# Patient Record
Sex: Female | Born: 1950 | Race: Black or African American | Hispanic: No | Marital: Single | Smoking: Never smoker
Health system: Southern US, Community
[De-identification: ages and names within clinical notes are randomized; demographics above are authoritative.]

## PROBLEM LIST (undated history)

## (undated) DIAGNOSIS — E785 Hyperlipidemia, unspecified: Secondary | ICD-10-CM

## (undated) DIAGNOSIS — C50919 Malignant neoplasm of unspecified site of unspecified female breast: Secondary | ICD-10-CM

## (undated) DIAGNOSIS — I639 Cerebral infarction, unspecified: Secondary | ICD-10-CM

## (undated) DIAGNOSIS — Z923 Personal history of irradiation: Secondary | ICD-10-CM

## (undated) DIAGNOSIS — T4145XA Adverse effect of unspecified anesthetic, initial encounter: Secondary | ICD-10-CM

## (undated) DIAGNOSIS — Z87442 Personal history of urinary calculi: Secondary | ICD-10-CM

## (undated) DIAGNOSIS — C801 Malignant (primary) neoplasm, unspecified: Secondary | ICD-10-CM

## (undated) DIAGNOSIS — M1990 Unspecified osteoarthritis, unspecified site: Secondary | ICD-10-CM

## (undated) DIAGNOSIS — R112 Nausea with vomiting, unspecified: Secondary | ICD-10-CM

## (undated) DIAGNOSIS — I10 Essential (primary) hypertension: Secondary | ICD-10-CM

## (undated) DIAGNOSIS — Z9889 Other specified postprocedural states: Secondary | ICD-10-CM

## (undated) DIAGNOSIS — T8859XA Other complications of anesthesia, initial encounter: Secondary | ICD-10-CM

## (undated) HISTORY — DX: Malignant neoplasm of unspecified site of unspecified female breast: C50.919

## (undated) HISTORY — DX: Hyperlipidemia, unspecified: E78.5

## (undated) HISTORY — DX: Essential (primary) hypertension: I10

## (undated) HISTORY — PX: ABDOMINAL HYSTERECTOMY: SHX81

---

## 2001-01-08 HISTORY — PX: EYE SURGERY: SHX253

## 2003-12-31 ENCOUNTER — Emergency Department: Payer: Self-pay | Admitting: Emergency Medicine

## 2004-01-13 ENCOUNTER — Ambulatory Visit: Payer: Self-pay | Admitting: Emergency Medicine

## 2005-03-15 ENCOUNTER — Ambulatory Visit: Payer: Self-pay

## 2006-01-29 ENCOUNTER — Ambulatory Visit: Payer: Self-pay | Admitting: Gastroenterology

## 2006-02-19 ENCOUNTER — Ambulatory Visit: Payer: Self-pay | Admitting: Internal Medicine

## 2006-03-27 ENCOUNTER — Ambulatory Visit: Payer: Self-pay | Admitting: Internal Medicine

## 2006-04-05 ENCOUNTER — Ambulatory Visit: Payer: Self-pay | Admitting: Internal Medicine

## 2006-04-16 ENCOUNTER — Ambulatory Visit: Payer: Self-pay | Admitting: General Practice

## 2006-05-09 ENCOUNTER — Ambulatory Visit: Payer: Self-pay | Admitting: General Practice

## 2006-10-11 ENCOUNTER — Ambulatory Visit: Payer: Self-pay | Admitting: Internal Medicine

## 2006-11-10 ENCOUNTER — Emergency Department: Payer: Self-pay | Admitting: Emergency Medicine

## 2007-01-09 DIAGNOSIS — I639 Cerebral infarction, unspecified: Secondary | ICD-10-CM

## 2007-01-09 HISTORY — DX: Cerebral infarction, unspecified: I63.9

## 2007-04-10 ENCOUNTER — Ambulatory Visit: Payer: Self-pay | Admitting: Internal Medicine

## 2007-08-11 ENCOUNTER — Emergency Department: Payer: Self-pay | Admitting: Emergency Medicine

## 2007-08-11 ENCOUNTER — Other Ambulatory Visit: Payer: Self-pay

## 2007-08-19 ENCOUNTER — Ambulatory Visit: Payer: Self-pay | Admitting: Internal Medicine

## 2007-08-26 ENCOUNTER — Ambulatory Visit: Payer: Self-pay | Admitting: Internal Medicine

## 2008-06-09 ENCOUNTER — Ambulatory Visit: Payer: Self-pay | Admitting: Internal Medicine

## 2009-03-31 ENCOUNTER — Ambulatory Visit: Payer: Self-pay | Admitting: Ophthalmology

## 2009-06-15 ENCOUNTER — Ambulatory Visit: Payer: Self-pay | Admitting: Internal Medicine

## 2009-09-27 ENCOUNTER — Ambulatory Visit: Payer: Self-pay | Admitting: Internal Medicine

## 2009-09-30 ENCOUNTER — Ambulatory Visit: Payer: Self-pay | Admitting: General Practice

## 2009-11-22 ENCOUNTER — Ambulatory Visit: Payer: Self-pay | Admitting: Urology

## 2010-04-04 ENCOUNTER — Ambulatory Visit: Payer: Self-pay | Admitting: Urology

## 2010-05-30 ENCOUNTER — Ambulatory Visit: Payer: Self-pay | Admitting: Internal Medicine

## 2010-10-31 ENCOUNTER — Ambulatory Visit: Payer: Self-pay | Admitting: Urology

## 2010-11-07 ENCOUNTER — Ambulatory Visit: Payer: Self-pay | Admitting: Urology

## 2011-01-09 DIAGNOSIS — C801 Malignant (primary) neoplasm, unspecified: Secondary | ICD-10-CM

## 2011-01-09 HISTORY — PX: PARTIAL NEPHRECTOMY: SHX414

## 2011-01-09 HISTORY — PX: KIDNEY SURGERY: SHX687

## 2011-01-09 HISTORY — DX: Malignant (primary) neoplasm, unspecified: C80.1

## 2011-02-13 ENCOUNTER — Ambulatory Visit: Payer: Self-pay | Admitting: General Practice

## 2011-07-17 ENCOUNTER — Ambulatory Visit: Payer: Self-pay | Admitting: Internal Medicine

## 2011-07-18 ENCOUNTER — Ambulatory Visit: Payer: Self-pay | Admitting: Urology

## 2011-07-18 LAB — CREATININE, SERUM: Creatinine: 1 mg/dL (ref 0.60–1.30)

## 2012-05-27 DIAGNOSIS — Z85528 Personal history of other malignant neoplasm of kidney: Secondary | ICD-10-CM | POA: Insufficient documentation

## 2012-08-01 ENCOUNTER — Ambulatory Visit: Payer: Self-pay | Admitting: Internal Medicine

## 2013-01-22 ENCOUNTER — Ambulatory Visit: Payer: Self-pay | Admitting: Physician Assistant

## 2013-02-08 ENCOUNTER — Ambulatory Visit: Payer: Self-pay | Admitting: Physician Assistant

## 2013-03-19 ENCOUNTER — Ambulatory Visit: Payer: Self-pay | Admitting: Obstetrics and Gynecology

## 2013-07-01 ENCOUNTER — Ambulatory Visit: Payer: Self-pay | Admitting: Physician Assistant

## 2013-07-08 ENCOUNTER — Ambulatory Visit: Payer: Self-pay | Admitting: Physician Assistant

## 2013-07-28 DIAGNOSIS — Z8673 Personal history of transient ischemic attack (TIA), and cerebral infarction without residual deficits: Secondary | ICD-10-CM | POA: Insufficient documentation

## 2013-08-19 ENCOUNTER — Ambulatory Visit: Payer: Self-pay | Admitting: Physician Assistant

## 2013-08-21 ENCOUNTER — Ambulatory Visit: Payer: Self-pay | Admitting: Internal Medicine

## 2013-09-08 ENCOUNTER — Ambulatory Visit: Payer: Self-pay | Admitting: Physician Assistant

## 2013-10-08 ENCOUNTER — Ambulatory Visit: Payer: Self-pay | Admitting: Physician Assistant

## 2013-11-18 ENCOUNTER — Ambulatory Visit: Payer: Self-pay | Admitting: Nurse Practitioner

## 2014-04-12 ENCOUNTER — Ambulatory Visit
Admit: 2014-04-12 | Disposition: A | Discharge status: Home / Self Care | Payer: Self-pay | Attending: Physician Assistant | Admitting: Physician Assistant

## 2014-05-18 ENCOUNTER — Encounter: Payer: 59 | Attending: Physician Assistant | Admitting: Dietician

## 2014-05-18 ENCOUNTER — Encounter: Payer: Self-pay | Admitting: Dietician

## 2014-05-18 VITALS — Ht 64.0 in | Wt 167.2 lb

## 2014-05-18 DIAGNOSIS — Z713 Dietary counseling and surveillance: Secondary | ICD-10-CM | POA: Insufficient documentation

## 2014-05-18 DIAGNOSIS — I10 Essential (primary) hypertension: Secondary | ICD-10-CM

## 2014-05-18 DIAGNOSIS — E669 Obesity, unspecified: Secondary | ICD-10-CM

## 2014-05-18 NOTE — Progress Notes (Signed)
Medical Nutrition Therapy: Visit time:1:15-1:45   Follow-up appointment  Diagnosis: hypertension Pt.'s priority is her desire to lose weight.  Current weight: 167.2  Height: 64 in.  Medications, supplements: see list  Progress and evaluation: Weight gain of 2.3 lbs in the past month since previous visit. Pt. stated that she has had increased stress with changes at work. She states she is discouraged that she has gained weight. She has been experiencing knee pain which has prevented her from walking for exercise. She has been eating larger portions at lunch and dinner and most of her rmeals are "out" with increased high fat choices. She also stated that snacks are being provided at he work which has been difficult to "turn down". She followed through with previous goal to switch from biscuit to [place] muffin but has not been able to follow through with other goals.  Physical activity: no structured exercise. She is "on her feet" at work and due to knee pain, does not walk for exercise at other times.   Nutrition Care Education: Weight control: benefits of weight control, identifying healthy weight, determining reasonable weight goal, behavioral changes for weight loss. Hypertension:  importance of controlling BP, identifying high sodium foods, Hyperlipidemia: lower fat choices when eating "out" Other lifestyle changes:  benefits of making changes, increasing motivation, readiness for change, identifying habits that need to change, alcohol use, food and drug interactions  Nutritional Diagnosis:  Inadequate intake of fruits/vegetables, frequent intake of high fat foods, frequent intake of sweetened beverages  Intervention: Pt. set goals to switch to sugar-free beverages or water. To use better portion control at lunch and dinner. To try to include a fruit or vegetable or both at meals.   Education Materials given:  Marland Kitchen Goals/ instructions  Learner/ who was taught:  . Patient   Level of  understanding: Marland Kitchen Verbalizes/ demonstrates competency Learning barriers: . None  Willingness to learn/ readiness for change: . Acceptance, ready for change  Monitoring and Evaluation:  Follow-up 07/28/14 at 8:00am

## 2014-05-18 NOTE — Patient Instructions (Signed)
Pt. Set goals to switch to sugar-free beverages or water. To use better portion control at lunch and dinner. To try to include a fruit or vegetable or both at meals.

## 2014-07-28 ENCOUNTER — Encounter: Payer: Self-pay | Admitting: Dietician

## 2014-07-28 ENCOUNTER — Encounter: Payer: 59 | Attending: Physician Assistant | Admitting: Dietician

## 2014-07-28 VITALS — Ht 64.0 in | Wt 165.6 lb

## 2014-07-28 DIAGNOSIS — Z713 Dietary counseling and surveillance: Secondary | ICD-10-CM | POA: Diagnosis not present

## 2014-07-28 DIAGNOSIS — I10 Essential (primary) hypertension: Secondary | ICD-10-CM

## 2014-07-28 NOTE — Progress Notes (Signed)
Medical Nutrition Therapy Follow-up visit:  Time:8:00am to 8:45am  ASSESSMENT:  Diagnosis: hypertension, obesity (Pt.'s main focus is desire to lose weight and maintain that weight loss).  Current weight:165.6    Height: 64" Medications: See list  Progress and evaluation:  Pt.lost 1.6 lbs since previous visit approximately 2 months ago. She expressed that she was very pleased that she had not gained weight. Regarding her previous goals, she continues to include some sweetened beverages but has decreased amount and is drinking 1 (20 oz) bottle of water per day. She states she has been able to control portions better but still needs to continue to work on this. She does not eat fruit on a daily basis and includes 1-2 servings of vegetables per day.  Physical activity: She had started walking for exercise but do to heat has not been able to do so in past 2 weeks.  NUTRITION CARE EDUCATION:  Weight control: Commended patient on her perseverance with her efforts to improve eating habits. Reviewed previous goals and her progress and barriers.  Hypertension:  Discussed how increasing fruit and vegetable intake not only helps with weight loss but also adds potassium to her diet which has a positive effect on blood pressure. Reviewed ways to decrease sodium in the diet. INTERVENTION:  Increase water to 2 (20 oz) bottles per day. To continue to decrease sugar sweetened beverages. Increase fruit to 1 serving per day. Continue to include as many vegetables as possible. Consider steamed vegetables or slicing a full tomato and cucumber with evening meal.  EDUCATION MATERIALS GIVEN:  . Goals/ instructions  LEARNER/ who was taught:  . Patient  LEVEL OF UNDERSTANDING: . Verbalizes/ demonstrates competency LEARNING BARRIERS: . None WILLINGNESS TO LEARN/READINESS FOR CHANGE: . Eager, change in progress MONITORING AND EVALUATION:  Diet, exercise, weight. Pt. requested follow-up in 6 months.01/19/15  at 8:00am

## 2014-07-28 NOTE — Progress Notes (Deleted)
  Medical Nutrition Therapy:  Appt start time: {Time; Appointment:21385} end time:  {Time; Appointment:21385}.   Assessment:  Primary concerns today: ***.   Preferred Learning Style: ***  Auditory  Visual  Hands on  No preference indicated   Learning Readiness: ***  Not ready  Contemplating  Ready  Change in progress   MEDICATIONS: ***   DIETARY INTAKE:  Usual eating pattern includes *** meals and *** snacks per day.  Everyday foods include ***.  Avoided foods include ***.    24-hr recall:  B ( AM): ***  Snk ( AM): ***  L ( PM): *** Snk ( PM): *** D ( PM): *** Snk ( PM): *** Beverages: ***  Usual physical activity: ***  Estimated energy needs: *** calories *** g carbohydrates *** g protein *** g fat  Progress Towards Goal(s):  {Desc; Goals Progress:21388}.   Nutritional Diagnosis:  {CHL AMB NUTRITIONAL DIAGNOSIS:[phone]}    Intervention:  Nutrition ***.  Teaching Method Utilized: *** Visual Auditory Hands on  Handouts given during visit include:  ***  ***  Barriers to learning/adherence to lifestyle change: ***  Demonstrated degree of understanding via:  Teach Back   Monitoring/Evaluation:  Dietary intake, exercise, ***, and body weight {follow up:15908}.

## 2014-07-28 NOTE — Patient Instructions (Signed)
Increase water to 2 (20 oz) bottles per day. To continue to decrease sugar sweetened beverages. Increase fruit to 1 serving per day. Continue to include as many vegetables as possible. Consider steamed vegetables or slicing a full tomato and cucumber with evening meal.

## 2014-08-03 ENCOUNTER — Other Ambulatory Visit: Payer: Self-pay | Admitting: Internal Medicine

## 2014-08-03 DIAGNOSIS — Z1231 Encounter for screening mammogram for malignant neoplasm of breast: Secondary | ICD-10-CM

## 2014-08-23 ENCOUNTER — Ambulatory Visit: Payer: 59

## 2014-08-27 ENCOUNTER — Other Ambulatory Visit: Payer: Self-pay | Admitting: Internal Medicine

## 2014-08-27 ENCOUNTER — Ambulatory Visit
Admission: RE | Admit: 2014-08-27 | Discharge: 2014-08-27 | Disposition: A | Discharge status: Home / Self Care | Payer: 59 | Source: Ambulatory Visit | Attending: Internal Medicine | Admitting: Internal Medicine

## 2014-08-27 DIAGNOSIS — Z1231 Encounter for screening mammogram for malignant neoplasm of breast: Secondary | ICD-10-CM | POA: Diagnosis not present

## 2014-08-27 DIAGNOSIS — R21 Rash and other nonspecific skin eruption: Secondary | ICD-10-CM | POA: Insufficient documentation

## 2014-08-27 HISTORY — DX: Malignant (primary) neoplasm, unspecified: C80.1

## 2014-08-31 ENCOUNTER — Other Ambulatory Visit: Payer: Self-pay | Admitting: Internal Medicine

## 2014-08-31 DIAGNOSIS — R921 Mammographic calcification found on diagnostic imaging of breast: Secondary | ICD-10-CM

## 2014-08-31 DIAGNOSIS — R928 Other abnormal and inconclusive findings on diagnostic imaging of breast: Secondary | ICD-10-CM

## 2014-09-03 ENCOUNTER — Ambulatory Visit
Admission: RE | Admit: 2014-09-03 | Discharge: 2014-09-03 | Disposition: A | Discharge status: Home / Self Care | Payer: 59 | Source: Ambulatory Visit | Attending: Internal Medicine | Admitting: Internal Medicine

## 2014-09-03 DIAGNOSIS — R928 Other abnormal and inconclusive findings on diagnostic imaging of breast: Secondary | ICD-10-CM

## 2014-09-03 DIAGNOSIS — R921 Mammographic calcification found on diagnostic imaging of breast: Secondary | ICD-10-CM | POA: Insufficient documentation

## 2014-10-26 ENCOUNTER — Encounter: Payer: Self-pay | Admitting: Physician Assistant

## 2014-10-26 ENCOUNTER — Ambulatory Visit: Payer: Self-pay | Admitting: Physician Assistant

## 2014-10-26 VITALS — BP 148/90 | Temp 98.3°F | Wt 164.0 lb

## 2014-10-26 DIAGNOSIS — H6500 Acute serous otitis media, unspecified ear: Secondary | ICD-10-CM

## 2014-10-26 MED ORDER — FLUTICASONE PROPIONATE 50 MCG/ACT NA SUSP: 2.0000 | Freq: Every day | NASAL | Status: DC

## 2014-10-26 MED ORDER — PREDNISONE 10 MG PO TABS
30.0000 mg | ORAL_TABLET | Freq: Every day | ORAL | Status: DC
Start: 2014-10-26 — End: 2014-12-14

## 2014-10-26 MED ORDER — FLUCONAZOLE 150 MG PO TABS: 150.0000 mg | ORAL_TABLET | Freq: Once | ORAL | Status: DC

## 2014-10-26 MED ORDER — AMOXICILLIN 875 MG PO TABS: 875.0000 mg | ORAL_TABLET | Freq: Two times a day (BID) | ORAL | Status: DC

## 2014-10-26 NOTE — Progress Notes (Signed)
S:  C/o ears popping and being stopped up, no drainage from ears, no fever/chills, no cough or congestion, some sinus pressure, also left hand and forearm hurting from carpal tunnel, burning type pain, makes the back of her left shoulder hurt, no recent injury, no cp/sob; remainder ros neg Using otc meds without relief  O:  Vitals wnl, nad, tms dull b/l, left tm  Has serous fluid line;  nasal mucosa swollen, throat wnl, neck supple no lymph, lungs c t a, cv rrr, neuro intact, cspine nontender, levator scapula tense, tender, grips = b/l  A: acute eustachean tube dysfunction, acute otitis media, chronic pain secondary to carpal tunnel  P: flonase, amoxil 875mg  bid x 10d, diflucan 150mg  1 now and 1 in 1 week, prednisone 30mg  qd x 3d, return if not improving in 3 to 5 days, return earlier if worsening

## 2014-11-17 ENCOUNTER — Other Ambulatory Visit: Payer: Self-pay | Admitting: Nurse Practitioner

## 2014-11-17 DIAGNOSIS — C649 Malignant neoplasm of unspecified kidney, except renal pelvis: Secondary | ICD-10-CM

## 2014-11-30 ENCOUNTER — Ambulatory Visit
Admission: RE | Admit: 2014-11-30 | Discharge: 2014-11-30 | Disposition: A | Discharge status: Home / Self Care | Payer: 59 | Source: Ambulatory Visit | Attending: Nurse Practitioner | Admitting: Nurse Practitioner

## 2014-11-30 DIAGNOSIS — Z905 Acquired absence of kidney: Secondary | ICD-10-CM | POA: Diagnosis not present

## 2014-11-30 DIAGNOSIS — C642 Malignant neoplasm of left kidney, except renal pelvis: Secondary | ICD-10-CM | POA: Diagnosis not present

## 2014-11-30 DIAGNOSIS — C649 Malignant neoplasm of unspecified kidney, except renal pelvis: Secondary | ICD-10-CM

## 2014-11-30 MED ORDER — IOHEXOL 350 MG/ML SOLN
100.0000 mL | Freq: Once | INTRAVENOUS | Status: AC | PRN
  Administered 2014-11-30: 100 mL via INTRAVENOUS

## 2014-12-14 ENCOUNTER — Encounter: Payer: Self-pay | Admitting: Physician Assistant

## 2014-12-14 ENCOUNTER — Ambulatory Visit: Payer: Self-pay | Admitting: Physician Assistant

## 2014-12-14 VITALS — BP 140/90 | HR 76 | Temp 98.5°F

## 2014-12-14 DIAGNOSIS — J3089 Other allergic rhinitis: Secondary | ICD-10-CM

## 2014-12-14 NOTE — Progress Notes (Signed)
S: just wants recheck of ears, sinuses, granddaughter is sick in hospital, states she just has clear mucus when her nose runs or when she coughs, no fever/chills, no cp/sob  O: vitals wnl, nad, tms dull, nasal mucosa inflamed, throat wnl, neck supple no lymph, lungs c t a, cv rrr  A: common cold, allergic rhinitis  P: reassurance, flonase, f/u if worsening, wear mask around granddaughter

## 2015-01-11 ENCOUNTER — Encounter: Payer: Self-pay | Admitting: Physician Assistant

## 2015-01-11 ENCOUNTER — Ambulatory Visit: Payer: Self-pay | Admitting: Physician Assistant

## 2015-01-11 VITALS — BP 124/80 | Temp 98.5°F

## 2015-01-11 DIAGNOSIS — N390 Urinary tract infection, site not specified: Secondary | ICD-10-CM

## 2015-01-11 DIAGNOSIS — R319 Hematuria, unspecified: Principal | ICD-10-CM

## 2015-01-11 LAB — POCT URINALYSIS DIPSTICK
Bilirubin, UA: NEGATIVE
Glucose, UA: NEGATIVE
KETONES UA: NEGATIVE
Nitrite, UA: NEGATIVE
PH UA: 5.5
Spec Grav, UA: 1.015
Urobilinogen, UA: 0.2

## 2015-01-11 MED ORDER — CIPROFLOXACIN HCL 250 MG PO TABS: 250.0000 mg | ORAL_TABLET | Freq: Two times a day (BID) | ORAL | Status: DC

## 2015-01-11 NOTE — Addendum Note (Signed)
Addended by: Vassie Loll D on: 01/11/2015 09:36 AM   Modules accepted: Orders

## 2015-01-11 NOTE — Progress Notes (Signed)
S:  C/o uti sx for 2 days, burning, urgency, frequency, denies vaginal discharge, abdominal pain or flank pain:  Remainder ros neg  O:  Vitals wnl, nad, no cva tenderness, back nontender, lungs c t a,cv rrr, abd soft nontender, bs normal, n/v intact  A: uti  P: cipro 250mg  bid x 7d, increase water intake, add cranberry juice, return if not improving in 2 -3 days, return earlier if worsening, discussed pyelonephritis sx

## 2015-01-19 ENCOUNTER — Encounter: Payer: 59 | Attending: Physician Assistant | Admitting: Dietician

## 2015-01-19 ENCOUNTER — Encounter: Payer: Self-pay | Admitting: Dietician

## 2015-01-19 VITALS — Ht 64.0 in | Wt 164.3 lb

## 2015-01-19 DIAGNOSIS — E669 Obesity, unspecified: Secondary | ICD-10-CM | POA: Diagnosis not present

## 2015-01-19 DIAGNOSIS — I10 Essential (primary) hypertension: Secondary | ICD-10-CM | POA: Diagnosis not present

## 2015-01-19 NOTE — Patient Instructions (Signed)
Increase water from 2 (16 oz) to 3 (16oz) bottles per day. At lunch, balance a higher fat choice such as chicken pie with lower fat choices such as non-starchy vegetables and fruit. Ok to have a snack type dinner, but try to balance. (cheese/crackers, fruit) or if picks up chicken nuggets, add steamed vegetables and fruit at home. Limit "sweet" snack to once daily.

## 2015-01-19 NOTE — Progress Notes (Signed)
Medical Nutrition Therapy Follow-up visit:  Time with patient: 8:15-8:45 ASSESSMENT:  Diagnosis:hypertension, obesity (Patient's main focus is weight management)  Current weight:164.3 lbs    Height:64 in Medications: See list Progress and evaluation:Patient's weight is 1.3 lbs less than last visit, 5 1/2 months ago. She continues to limit sodas to 1 (16oz) per day and has increased her water intake to 2 (16oz) bottles per day. She eats most lunch meals in hospital cafeteria and is making more high fat choices "than I should" she states. Ex. Yesterday's lunch was chicken pie with macaroni and cheese, dessert and Mt. Dew. She cooks on Sunday and has a meat and 2 vegetables but usually eats a "snack" for evening meal on week nights; ex. Last night she ate chocolate coated pretzels. She picks up chicken nuggets 1x per week for dinner with fries or a shake. Her beverages are juice (2x/day), 32 oz water, and 16 oz soda.  Physical activity: no structured exercise. Patient is "on her feet" all day at work.   NUTRITION CARE EDUCATION: Weight control: Commended patient on her stable weight even after the holiday season. Discussed food group servings needed to meet basic nutrient needs. Discussed how snacks can help with meeting groups that are lacking such as fruit and  milk (yogurt). Discussed options for dinner meal that would not involve a lot of food preparation/time. Reviewed food guide plate and how this tool can help balance meal when planning meals. Hypertension:  Discussed how adding more fruits and vegetables can help decrease portions of higher sodium foods and can provide more potassium in the diet and less sodium. Other lifestyle changes:  Discussed chair exercises that patient could do while watching TV in the evening since she finds structured exercise difficult after working "on feet" all day. INTERVENTION:  Increase water from 2 (16 oz) to 3 (16oz) bottles per day. At lunch, balance a  higher fat choice such as chicken pie with lower fat choices such as non-starchy vegetables and fruit. Ok to have a snack type dinner, but try to balance. (cheese/crackers, fruit) or if picks up chicken nuggets, add steamed vegetables and fruit at home. Limit "sweet" snack to once daily.   EDUCATION MATERIALS GIVEN:  . Goals/ instructions  LEARNER/ who was taught:  . Patient   LEVEL OF UNDERSTANDING: . Partial understanding; needs review/ practice LEARNING BARRIERS: . None WILLINGNESS TO LEARN/READINESS FOR CHANGE: . Acceptance, ready for change  MONITORING AND EVALUATION:  Weight, exercise, Follow-up- 06/22/15 at 8:15am

## 2015-01-25 DIAGNOSIS — C642 Malignant neoplasm of left kidney, except renal pelvis: Secondary | ICD-10-CM | POA: Diagnosis not present

## 2015-01-25 DIAGNOSIS — I10 Essential (primary) hypertension: Secondary | ICD-10-CM | POA: Diagnosis not present

## 2015-02-01 DIAGNOSIS — G5603 Carpal tunnel syndrome, bilateral upper limbs: Secondary | ICD-10-CM | POA: Diagnosis not present

## 2015-02-01 DIAGNOSIS — E784 Other hyperlipidemia: Secondary | ICD-10-CM | POA: Diagnosis not present

## 2015-02-01 DIAGNOSIS — I10 Essential (primary) hypertension: Secondary | ICD-10-CM | POA: Diagnosis not present

## 2015-02-16 ENCOUNTER — Ambulatory Visit: Payer: Self-pay | Admitting: Physician Assistant

## 2015-02-16 ENCOUNTER — Encounter: Payer: Self-pay | Admitting: Physician Assistant

## 2015-02-16 VITALS — BP 130/80 | Temp 97.9°F

## 2015-02-16 DIAGNOSIS — N390 Urinary tract infection, site not specified: Secondary | ICD-10-CM

## 2015-02-16 DIAGNOSIS — R319 Hematuria, unspecified: Principal | ICD-10-CM

## 2015-02-16 MED ORDER — NITROFURANTOIN MONOHYD MACRO 100 MG PO CAPS: 100.0000 mg | ORAL_CAPSULE | Freq: Two times a day (BID) | ORAL | Status: DC

## 2015-02-16 NOTE — Progress Notes (Signed)
S: c/o body aches, just doesn't feel well, some burning with urination, no fever, no abdominal pain  O: vitals wnl, lungs c t a, cv rrr  A: uti  P: macrobid 100mg  bid x 7d, recheck on day after finish pills

## 2015-02-17 LAB — POCT URINALYSIS DIPSTICK
BILIRUBIN UA: NEGATIVE
GLUCOSE UA: NEGATIVE
KETONES UA: NEGATIVE
Nitrite, UA: NEGATIVE
Spec Grav, UA: 1.02
Urobilinogen, UA: 0.2
pH, UA: 7

## 2015-02-17 NOTE — Addendum Note (Signed)
Addended by: Vassie Loll D on: 02/17/2015 09:01 AM   Modules accepted: Orders

## 2015-03-07 ENCOUNTER — Ambulatory Visit: Payer: Self-pay | Admitting: Family

## 2015-03-07 ENCOUNTER — Encounter: Payer: Self-pay | Admitting: Physician Assistant

## 2015-03-07 ENCOUNTER — Other Ambulatory Visit
Admission: RE | Admit: 2015-03-07 | Discharge: 2015-03-07 | Disposition: A | Discharge status: Home / Self Care | Payer: 59 | Source: Ambulatory Visit | Attending: Medical | Admitting: Medical

## 2015-03-07 VITALS — BP 120/70 | HR 76 | Temp 97.6°F

## 2015-03-07 DIAGNOSIS — N390 Urinary tract infection, site not specified: Secondary | ICD-10-CM

## 2015-03-07 DIAGNOSIS — H6982 Other specified disorders of Eustachian tube, left ear: Secondary | ICD-10-CM

## 2015-03-07 DIAGNOSIS — R300 Dysuria: Secondary | ICD-10-CM

## 2015-03-07 LAB — POCT URINALYSIS DIPSTICK
BILIRUBIN UA: NEGATIVE
GLUCOSE UA: NEGATIVE
Ketones, UA: NEGATIVE
NITRITE UA: NEGATIVE
Protein, UA: NEGATIVE
Spec Grav, UA: >=1.03
Urobilinogen, UA: 0.2
pH, UA: 5.5

## 2015-03-08 NOTE — Progress Notes (Signed)
See note in paper chart by Heather Ratcliffe, PAC  

## 2015-03-09 LAB — URINE CULTURE

## 2015-03-10 ENCOUNTER — Telehealth: Payer: Self-pay | Admitting: Family

## 2015-03-10 NOTE — Telephone Encounter (Signed)
Urine Culture + enterobacter sensitive to cipro   Tell her to continue cipro and RTC  2-3 days after last dose for repeat U/A .  [place] FNP

## 2015-03-11 NOTE — Telephone Encounter (Signed)
Spoke with patient informed her Per Tommie that she is to continue Cipro til gone then repeat UA.

## 2015-03-22 ENCOUNTER — Ambulatory Visit: Payer: Self-pay | Admitting: Physician Assistant

## 2015-03-22 DIAGNOSIS — R300 Dysuria: Secondary | ICD-10-CM

## 2015-03-22 LAB — POCT URINALYSIS DIPSTICK
BILIRUBIN UA: NEGATIVE
Glucose, UA: NEGATIVE
Ketones, UA: NEGATIVE
LEUKOCYTES UA: NEGATIVE
NITRITE UA: NEGATIVE
PH UA: 5.5
PROTEIN UA: NEGATIVE
RBC UA: NEGATIVE
Spec Grav, UA: 1.025
UROBILINOGEN UA: 0.2

## 2015-03-22 NOTE — Progress Notes (Signed)
Pt came by to recheck urine, ua wnl, uti is resolved; seen by cma

## 2015-04-26 DIAGNOSIS — H401112 Primary open-angle glaucoma, right eye, moderate stage: Secondary | ICD-10-CM | POA: Diagnosis not present

## 2015-05-03 DIAGNOSIS — H401111 Primary open-angle glaucoma, right eye, mild stage: Secondary | ICD-10-CM | POA: Diagnosis not present

## 2015-05-20 ENCOUNTER — Other Ambulatory Visit: Payer: Self-pay | Admitting: Unknown Physician Specialty

## 2015-05-20 ENCOUNTER — Ambulatory Visit
Admission: RE | Admit: 2015-05-20 | Discharge: 2015-05-20 | Disposition: A | Discharge status: Home / Self Care | Payer: 59 | Source: Ambulatory Visit | Attending: Unknown Physician Specialty | Admitting: Unknown Physician Specialty

## 2015-05-20 DIAGNOSIS — R1032 Left lower quadrant pain: Secondary | ICD-10-CM | POA: Insufficient documentation

## 2015-05-20 DIAGNOSIS — Z905 Acquired absence of kidney: Secondary | ICD-10-CM | POA: Diagnosis not present

## 2015-05-20 DIAGNOSIS — N200 Calculus of kidney: Secondary | ICD-10-CM | POA: Diagnosis not present

## 2015-05-20 DIAGNOSIS — K5790 Diverticulosis of intestine, part unspecified, without perforation or abscess without bleeding: Secondary | ICD-10-CM | POA: Diagnosis not present

## 2015-05-27 ENCOUNTER — Ambulatory Visit: Payer: 59

## 2015-06-22 ENCOUNTER — Encounter: Payer: Self-pay | Admitting: Dietician

## 2015-06-22 ENCOUNTER — Encounter: Payer: 59 | Attending: Physician Assistant | Admitting: Dietician

## 2015-06-22 VITALS — Ht 64.0 in | Wt 165.0 lb

## 2015-06-22 DIAGNOSIS — E669 Obesity, unspecified: Secondary | ICD-10-CM | POA: Insufficient documentation

## 2015-06-22 DIAGNOSIS — I10 Essential (primary) hypertension: Secondary | ICD-10-CM | POA: Diagnosis not present

## 2015-06-22 NOTE — Patient Instructions (Signed)
Increase water intake to 2 (16 oz) bottles per day. Include yogurt for some breakfast meals. Add a fruit or vegetable or both to as many meals as possible.

## 2015-06-22 NOTE — Progress Notes (Signed)
Medical Nutrition Therapy: Visit start time: 8:05 end time: 8:45 Assessment:  Diagnosis: obesity, hypertension  Current weight: 165 lbs  Height: 64 in Medications, supplements: see list Progress and evaluation:  Patient in for medical nutrition therapy follow-up appointment. She has maintained a stable weight in past 5 months since previous visit. She reports she is eating breakfast on most mornings giving examples of [place] crackers or a breakfast bar or [place] Clap McGriddle breakfast or eggs, small pancake and bacon 1-2 mornings per week. She eats lunch in hospital cafeteria and reports she often includes a dessert or chocolate covered pretzels.She doesn't cook often during the week so eats a snack type supper or skips and eats chips. Her beverages are 16 oz.soda, 8-16 oz. water and 16 oz. orange juice. She reports she had diverticulitis in May '17. Her present diet is low in fruits, vegetables, whole grains and fiber. Patient expresses a continued desire to lose weight.  Physical activity: Pt. Is "on her feet" most of her work day and sometimes works a double shift. She has plans to join [employer].   Nutrition Care Education:  Weight control: Commended patient on maintaining a stable weight over the past year despite increased work related stress. Discussed re-establishing a goal of losing 5-10% of body weight. Discussed ways to decrease current intake by 300-500 calories such as eliminating regular soda or choosing yogurt (she states she loves yogurt) in place of dessert on most days. Also discussed yogurt in terms of benefiting as a probiotic. Discussed need to increase fiber to help decrease risk of diverticulitis acknowledging need for low fiber during an attack. Discussed practical ways to increase fruits/vegetables. Commended on her plans to join [facility] gym and how exercise will not only help with weight loss and blood pressure but can also help with stress management. Hypertension:  Discussed  how adding fruits and vegetables can add natural potassium to the diet which can play a role in lowering blood pressure. Add these foods to help decrease intake of high sodium foods as we have discussed in previous visits.  Intervention: Goals that patient made: Increase water intake to 2 (16 oz) bottles per day. Include yogurt for some breakfast meals. Add a fruit or vegetable or both to as many meals as possible. Join hospital gym. Education Materials given:  Marland Kitchen Goals/ instructions Learner/ who was taught:  . Patient  Level of understanding: Marland Kitchen Verbalizes/ demonstrates competency Learning barriers: . None Willingness to learn/ readiness for change: . Eager, change in progress  Monitoring and Evaluation:  No follow-up scheduled. Patient was encouraged to call if desires further help regarding her diet/nutrition.

## 2015-08-02 DIAGNOSIS — C642 Malignant neoplasm of left kidney, except renal pelvis: Secondary | ICD-10-CM | POA: Diagnosis not present

## 2015-08-02 DIAGNOSIS — E784 Other hyperlipidemia: Secondary | ICD-10-CM | POA: Diagnosis not present

## 2015-08-02 DIAGNOSIS — I10 Essential (primary) hypertension: Secondary | ICD-10-CM | POA: Diagnosis not present

## 2015-08-02 DIAGNOSIS — Z8673 Personal history of transient ischemic attack (TIA), and cerebral infarction without residual deficits: Secondary | ICD-10-CM | POA: Diagnosis not present

## 2015-08-09 ENCOUNTER — Other Ambulatory Visit: Payer: Self-pay | Admitting: Internal Medicine

## 2015-08-09 DIAGNOSIS — Z8673 Personal history of transient ischemic attack (TIA), and cerebral infarction without residual deficits: Secondary | ICD-10-CM | POA: Diagnosis not present

## 2015-08-09 DIAGNOSIS — N760 Acute vaginitis: Secondary | ICD-10-CM | POA: Diagnosis not present

## 2015-08-09 DIAGNOSIS — I10 Essential (primary) hypertension: Secondary | ICD-10-CM | POA: Diagnosis not present

## 2015-08-09 DIAGNOSIS — Z85528 Personal history of other malignant neoplasm of kidney: Secondary | ICD-10-CM | POA: Diagnosis not present

## 2015-08-09 DIAGNOSIS — E784 Other hyperlipidemia: Secondary | ICD-10-CM | POA: Diagnosis not present

## 2015-08-10 ENCOUNTER — Other Ambulatory Visit: Payer: Self-pay | Admitting: Internal Medicine

## 2015-08-10 DIAGNOSIS — R920 Mammographic microcalcification found on diagnostic imaging of breast: Secondary | ICD-10-CM

## 2015-09-27 ENCOUNTER — Ambulatory Visit
Admission: RE | Admit: 2015-09-27 | Discharge: 2015-09-27 | Disposition: A | Discharge status: Home / Self Care | Payer: 59 | Source: Ambulatory Visit | Attending: Internal Medicine | Admitting: Internal Medicine

## 2015-09-27 DIAGNOSIS — R920 Mammographic microcalcification found on diagnostic imaging of breast: Secondary | ICD-10-CM

## 2015-09-27 DIAGNOSIS — R921 Mammographic calcification found on diagnostic imaging of breast: Secondary | ICD-10-CM | POA: Diagnosis not present

## 2015-09-30 ENCOUNTER — Other Ambulatory Visit: Payer: Self-pay | Admitting: Nurse Practitioner

## 2015-09-30 DIAGNOSIS — C649 Malignant neoplasm of unspecified kidney, except renal pelvis: Secondary | ICD-10-CM

## 2015-10-11 ENCOUNTER — Ambulatory Visit
Admission: RE | Admit: 2015-10-11 | Discharge: 2015-10-11 | Disposition: A | Discharge status: Home / Self Care | Payer: 59 | Source: Ambulatory Visit | Attending: Nurse Practitioner | Admitting: Nurse Practitioner

## 2015-10-11 DIAGNOSIS — C649 Malignant neoplasm of unspecified kidney, except renal pelvis: Secondary | ICD-10-CM | POA: Diagnosis not present

## 2015-10-11 LAB — POCT I-STAT CREATININE: CREATININE: 0.9 mg/dL (ref 0.44–1.00)

## 2015-10-11 MED ORDER — IOPAMIDOL (ISOVUE-300) INJECTION 61%
100.0000 mL | Freq: Once | INTRAVENOUS | Status: AC | PRN
  Administered 2015-10-11: 100 mL via INTRAVENOUS

## 2015-10-21 ENCOUNTER — Encounter: Payer: Self-pay | Admitting: Physician Assistant

## 2015-10-21 ENCOUNTER — Ambulatory Visit: Payer: Self-pay | Admitting: Physician Assistant

## 2015-10-21 VITALS — BP 154/80 | HR 76 | Temp 98.5°F

## 2015-10-21 DIAGNOSIS — M25562 Pain in left knee: Secondary | ICD-10-CM

## 2015-10-21 DIAGNOSIS — J301 Allergic rhinitis due to pollen: Secondary | ICD-10-CM

## 2015-10-21 NOTE — Progress Notes (Signed)
S: c/o runny nose, congestion, watery eyes, some sinus pressure, sx for about a week, also l knee pain and leg pain, been doing the steps a lot more, pain when she bears weight, no swelling, wears support hose, takes asa qd, nonsmoker and no hormone replacement;  denies fever/chills/body aches, cough, cp/sob, or v/d  O: vitals wnl, nad, perrl eomi, conjunctiva wnl, tms dull, nasal mucosa swollen and boggy, throat wnl, neck supple no lymph, lungs c t a, cv rrr, left knee no bony tenderness, full rom, no swelling or tenderness in calf, slight tenderness in posterior of knee, no swelling or cord noted, neg homan's sign, n/v intact  A: acute seasonal allergies, knee pain  P: saline nasal rinse, otc allergy meds, continue asa qd, if leg pain worsens then call clinic and will order [place]

## 2015-10-27 DIAGNOSIS — M25562 Pain in left knee: Secondary | ICD-10-CM | POA: Diagnosis not present

## 2015-10-27 DIAGNOSIS — M25561 Pain in right knee: Secondary | ICD-10-CM | POA: Diagnosis not present

## 2015-11-01 DIAGNOSIS — H4010X4 Unspecified open-angle glaucoma, indeterminate stage: Secondary | ICD-10-CM | POA: Diagnosis not present

## 2015-11-02 ENCOUNTER — Other Ambulatory Visit: Payer: Self-pay | Admitting: Physician Assistant

## 2015-11-14 DIAGNOSIS — Z905 Acquired absence of kidney: Secondary | ICD-10-CM | POA: Diagnosis not present

## 2015-11-14 DIAGNOSIS — C649 Malignant neoplasm of unspecified kidney, except renal pelvis: Secondary | ICD-10-CM | POA: Diagnosis not present

## 2015-11-14 DIAGNOSIS — Z882 Allergy status to sulfonamides status: Secondary | ICD-10-CM | POA: Diagnosis not present

## 2015-11-14 DIAGNOSIS — Z888 Allergy status to other drugs, medicaments and biological substances status: Secondary | ICD-10-CM | POA: Diagnosis not present

## 2015-11-28 ENCOUNTER — Ambulatory Visit: Payer: Self-pay | Admitting: Physician Assistant

## 2015-11-28 DIAGNOSIS — M25562 Pain in left knee: Secondary | ICD-10-CM

## 2015-11-28 NOTE — Progress Notes (Signed)
S: was at work, climbing steps with bottles of water, the water went one way and her knee went the other, felt a really loud pop, now area is more swollen than normal, no other injury  O: vitals wnl, nad, left knee swollen posteriorly, no redness or increased warmth, area tender to palp, n/v intact  A: knee pain  P: bc injury happened at work referred over to workers comp

## 2015-12-27 DIAGNOSIS — L821 Other seborrheic keratosis: Secondary | ICD-10-CM | POA: Diagnosis not present

## 2015-12-27 DIAGNOSIS — L814 Other melanin hyperpigmentation: Secondary | ICD-10-CM | POA: Diagnosis not present

## 2015-12-27 DIAGNOSIS — L819 Disorder of pigmentation, unspecified: Secondary | ICD-10-CM | POA: Diagnosis not present

## 2015-12-27 DIAGNOSIS — L818 Other specified disorders of pigmentation: Secondary | ICD-10-CM | POA: Diagnosis not present

## 2016-01-09 HISTORY — PX: COLONOSCOPY: SHX174

## 2016-02-02 DIAGNOSIS — E784 Other hyperlipidemia: Secondary | ICD-10-CM | POA: Diagnosis not present

## 2016-02-02 DIAGNOSIS — Z85528 Personal history of other malignant neoplasm of kidney: Secondary | ICD-10-CM | POA: Diagnosis not present

## 2016-02-07 DIAGNOSIS — E784 Other hyperlipidemia: Secondary | ICD-10-CM | POA: Diagnosis not present

## 2016-02-07 DIAGNOSIS — I10 Essential (primary) hypertension: Secondary | ICD-10-CM | POA: Diagnosis not present

## 2016-02-07 DIAGNOSIS — Z8673 Personal history of transient ischemic attack (TIA), and cerebral infarction without residual deficits: Secondary | ICD-10-CM | POA: Diagnosis not present

## 2016-02-07 DIAGNOSIS — Z85528 Personal history of other malignant neoplasm of kidney: Secondary | ICD-10-CM | POA: Diagnosis not present

## 2016-02-16 ENCOUNTER — Telehealth: Payer: Self-pay | Admitting: Physician Assistant

## 2016-02-17 NOTE — Telephone Encounter (Signed)
Why does she need to see nutrition again? Need a reason why and then I can refer

## 2016-02-20 DIAGNOSIS — Z1211 Encounter for screening for malignant neoplasm of colon: Secondary | ICD-10-CM | POA: Diagnosis not present

## 2016-02-20 NOTE — Telephone Encounter (Signed)
Spoke with patient and she informed me on 02/17/2016  That she was just interested in weight loss. Order was wrtten and given to [place] @ [facility]

## 2016-02-23 DIAGNOSIS — H5712 Ocular pain, left eye: Secondary | ICD-10-CM | POA: Diagnosis not present

## 2016-02-23 DIAGNOSIS — Z947 Corneal transplant status: Secondary | ICD-10-CM | POA: Diagnosis not present

## 2016-03-29 DIAGNOSIS — Z1211 Encounter for screening for malignant neoplasm of colon: Secondary | ICD-10-CM | POA: Diagnosis not present

## 2016-03-29 DIAGNOSIS — K640 First degree hemorrhoids: Secondary | ICD-10-CM | POA: Diagnosis not present

## 2016-04-16 ENCOUNTER — Ambulatory Visit: Payer: Self-pay | Admitting: Physician Assistant

## 2016-04-16 ENCOUNTER — Encounter: Payer: Self-pay | Admitting: Physician Assistant

## 2016-04-16 VITALS — BP 120/79 | HR 76 | Temp 98.2°F

## 2016-04-16 DIAGNOSIS — I10 Essential (primary) hypertension: Secondary | ICD-10-CM | POA: Insufficient documentation

## 2016-04-16 DIAGNOSIS — E785 Hyperlipidemia, unspecified: Secondary | ICD-10-CM | POA: Insufficient documentation

## 2016-04-16 DIAGNOSIS — H409 Unspecified glaucoma: Secondary | ICD-10-CM | POA: Insufficient documentation

## 2016-04-16 DIAGNOSIS — J069 Acute upper respiratory infection, unspecified: Secondary | ICD-10-CM

## 2016-04-16 MED ORDER — ALBUTEROL SULFATE HFA 108 (90 BASE) MCG/ACT IN AERS
2.0000 | INHALATION_SPRAY | Freq: Four times a day (QID) | RESPIRATORY_TRACT | 0 refills | Status: AC | PRN
Start: 2016-04-16 — End: ?

## 2016-04-16 MED ORDER — AZITHROMYCIN 250 MG PO TABS: ORAL_TABLET | ORAL | 0 refills | Status: DC

## 2016-04-16 NOTE — Progress Notes (Signed)
S: C/o runny nose and congestion for 3 days, no fever, chills, cp/sob, v/d; mucus was green this am but clear throughout the day, cough is sporadic, some wheezing at night  Using otc meds:   O: PE: vitals wnl, nad, perrl eomi, normocephalic, tms dull, nasal mucosa red and swollen, throat injected, neck supple no lymph, lungs c t a, cv rrr, neuro intact  A:  Acute viral uri   P: drink fluids, continue regular meds , use otc meds of choice, return if not improving in 5 days, return earlier if worsening. zpack, albuterol inhaler

## 2016-04-26 ENCOUNTER — Ambulatory Visit: Payer: Self-pay | Admitting: Dietician

## 2016-04-26 DIAGNOSIS — H4010X4 Unspecified open-angle glaucoma, indeterminate stage: Secondary | ICD-10-CM | POA: Diagnosis not present

## 2016-05-01 DIAGNOSIS — H401131 Primary open-angle glaucoma, bilateral, mild stage: Secondary | ICD-10-CM | POA: Diagnosis not present

## 2016-05-03 ENCOUNTER — Ambulatory Visit: Payer: Self-pay | Admitting: Dietician

## 2016-05-28 DIAGNOSIS — B359 Dermatophytosis, unspecified: Secondary | ICD-10-CM | POA: Diagnosis not present

## 2016-05-28 DIAGNOSIS — J40 Bronchitis, not specified as acute or chronic: Secondary | ICD-10-CM | POA: Diagnosis not present

## 2016-05-28 DIAGNOSIS — R350 Frequency of micturition: Secondary | ICD-10-CM | POA: Diagnosis not present

## 2016-05-31 DIAGNOSIS — H401131 Primary open-angle glaucoma, bilateral, mild stage: Secondary | ICD-10-CM | POA: Diagnosis not present

## 2016-06-05 ENCOUNTER — Ambulatory Visit: Payer: Self-pay | Admitting: Dietician

## 2016-06-13 DIAGNOSIS — S83282D Other tear of lateral meniscus, current injury, left knee, subsequent encounter: Secondary | ICD-10-CM | POA: Diagnosis not present

## 2016-07-06 DIAGNOSIS — R309 Painful micturition, unspecified: Secondary | ICD-10-CM | POA: Diagnosis not present

## 2016-07-06 DIAGNOSIS — R350 Frequency of micturition: Secondary | ICD-10-CM | POA: Diagnosis not present

## 2016-07-12 DIAGNOSIS — N390 Urinary tract infection, site not specified: Secondary | ICD-10-CM | POA: Diagnosis not present

## 2016-07-12 DIAGNOSIS — R310 Gross hematuria: Secondary | ICD-10-CM | POA: Diagnosis not present

## 2016-07-24 ENCOUNTER — Other Ambulatory Visit: Payer: Self-pay | Admitting: Nurse Practitioner

## 2016-07-24 DIAGNOSIS — C642 Malignant neoplasm of left kidney, except renal pelvis: Secondary | ICD-10-CM

## 2016-08-30 DIAGNOSIS — I10 Essential (primary) hypertension: Secondary | ICD-10-CM | POA: Diagnosis not present

## 2016-08-30 DIAGNOSIS — H401131 Primary open-angle glaucoma, bilateral, mild stage: Secondary | ICD-10-CM | POA: Diagnosis not present

## 2016-08-30 DIAGNOSIS — Z85528 Personal history of other malignant neoplasm of kidney: Secondary | ICD-10-CM | POA: Diagnosis not present

## 2016-09-06 ENCOUNTER — Other Ambulatory Visit: Payer: Self-pay | Admitting: Internal Medicine

## 2016-09-06 DIAGNOSIS — E785 Hyperlipidemia, unspecified: Secondary | ICD-10-CM | POA: Diagnosis not present

## 2016-09-06 DIAGNOSIS — Z85528 Personal history of other malignant neoplasm of kidney: Secondary | ICD-10-CM | POA: Diagnosis not present

## 2016-09-06 DIAGNOSIS — R920 Mammographic microcalcification found on diagnostic imaging of breast: Secondary | ICD-10-CM

## 2016-09-06 DIAGNOSIS — I10 Essential (primary) hypertension: Secondary | ICD-10-CM | POA: Diagnosis not present

## 2016-09-06 DIAGNOSIS — Z0000 Encounter for general adult medical examination without abnormal findings: Secondary | ICD-10-CM | POA: Diagnosis not present

## 2016-09-27 DIAGNOSIS — M1712 Unilateral primary osteoarthritis, left knee: Secondary | ICD-10-CM | POA: Diagnosis not present

## 2016-10-22 ENCOUNTER — Ambulatory Visit: Payer: Self-pay | Admitting: Physician Assistant

## 2016-10-22 ENCOUNTER — Encounter: Payer: Self-pay | Admitting: Physician Assistant

## 2016-10-22 VITALS — BP 150/90 | HR 80 | Temp 97.9°F

## 2016-10-22 DIAGNOSIS — R300 Dysuria: Secondary | ICD-10-CM

## 2016-10-22 DIAGNOSIS — N390 Urinary tract infection, site not specified: Secondary | ICD-10-CM

## 2016-10-22 MED ORDER — NITROFURANTOIN MONOHYD MACRO 100 MG PO CAPS: 100.0000 mg | ORAL_CAPSULE | Freq: Two times a day (BID) | ORAL | 0 refills | Status: DC

## 2016-10-22 NOTE — Progress Notes (Signed)
S:  C/o uti sx for 2 days, odor, frequency, denies fever/chills, vaginal discharge, abdominal pain or flank pain:  Remainder ros neg  O:  Vitals wnl, nad, no cva tenderness, back nontender, lungs c t a,cv rrr, ua +trace leuks  A: uti  P: macrobid 100mg  mg bid x 7d, increase water intake, add cranberry juice, return if not improving in 2 -3 days, return earlier if worsening, discussed pyelonephritis sx

## 2016-10-23 LAB — POCT URINALYSIS DIPSTICK
Bilirubin, UA: NEGATIVE
Glucose, UA: NEGATIVE
Ketones, UA: NEGATIVE
Nitrite, UA: NEGATIVE
PH UA: 7 (ref 5.0–8.0)
Protein, UA: NEGATIVE
RBC UA: NEGATIVE
Spec Grav, UA: 1.02 (ref 1.010–1.025)
Urobilinogen, UA: 0.2 E.U./dL

## 2016-10-25 ENCOUNTER — Ambulatory Visit
Admission: RE | Admit: 2016-10-25 | Discharge: 2016-10-25 | Disposition: A | Discharge status: Home / Self Care | Payer: 59 | Source: Ambulatory Visit | Attending: Nurse Practitioner | Admitting: Nurse Practitioner

## 2016-10-25 DIAGNOSIS — C642 Malignant neoplasm of left kidney, except renal pelvis: Secondary | ICD-10-CM | POA: Insufficient documentation

## 2016-10-25 DIAGNOSIS — Z905 Acquired absence of kidney: Secondary | ICD-10-CM | POA: Insufficient documentation

## 2016-10-25 DIAGNOSIS — I700 Atherosclerosis of aorta: Secondary | ICD-10-CM | POA: Diagnosis not present

## 2016-10-25 DIAGNOSIS — N200 Calculus of kidney: Secondary | ICD-10-CM | POA: Diagnosis not present

## 2016-10-25 LAB — POCT I-STAT CREATININE: Creatinine, Ser: 0.9 mg/dL (ref 0.44–1.00)

## 2016-10-25 MED ORDER — IOPAMIDOL (ISOVUE-300) INJECTION 61%
100.0000 mL | Freq: Once | INTRAVENOUS | Status: AC | PRN
  Administered 2016-10-25: 100 mL via INTRAVENOUS

## 2016-11-01 ENCOUNTER — Ambulatory Visit: Payer: Self-pay | Admitting: Physician Assistant

## 2016-11-01 ENCOUNTER — Encounter: Payer: Self-pay | Admitting: Physician Assistant

## 2016-11-01 VITALS — BP 130/80 | HR 69 | Temp 97.7°F

## 2016-11-01 DIAGNOSIS — R300 Dysuria: Secondary | ICD-10-CM

## 2016-11-01 DIAGNOSIS — J00 Acute nasopharyngitis [common cold]: Secondary | ICD-10-CM

## 2016-11-01 LAB — POCT URINALYSIS DIPSTICK
Bilirubin, UA: NEGATIVE
Blood, UA: NEGATIVE
Glucose, UA: NEGATIVE
Ketones, UA: NEGATIVE
Leukocytes, UA: NEGATIVE
Nitrite, UA: NEGATIVE
Protein, UA: NEGATIVE
Spec Grav, UA: 1.02 (ref 1.010–1.025)
Urobilinogen, UA: 0.2 E.U./dL
pH, UA: 7 (ref 5.0–8.0)

## 2016-11-01 MED ORDER — BENZONATATE 200 MG PO CAPS: 200.0000 mg | ORAL_CAPSULE | Freq: Three times a day (TID) | ORAL | 0 refills | Status: DC | PRN

## 2016-11-01 NOTE — Progress Notes (Signed)
S: C/o cough  for 7 days, no fever, chills, cp/sob, v/d; mucus is clear throughout the day, cough is sporadic, also here to have urine rechecked  Using otc meds:   O: PE: vitals wnl, nadperrl eomi, normocephalic, tms dull, nasal mucosa red and swollen, throat injected, neck supple no lymph, lungs c t a, cv rrr, neuro intact, ua wnl  A:  Acute viral uri   P: drink fluids, continue regular meds , use otc meds of choice, return if not improving in 5 days, return earlier if worsening , tessalon perls

## 2016-11-02 ENCOUNTER — Ambulatory Visit
Admission: RE | Admit: 2016-11-02 | Discharge: 2016-11-02 | Disposition: A | Discharge status: Home / Self Care | Payer: 59 | Source: Ambulatory Visit | Attending: Internal Medicine | Admitting: Internal Medicine

## 2016-11-02 DIAGNOSIS — R920 Mammographic microcalcification found on diagnostic imaging of breast: Secondary | ICD-10-CM | POA: Diagnosis present

## 2016-11-02 DIAGNOSIS — R921 Mammographic calcification found on diagnostic imaging of breast: Secondary | ICD-10-CM | POA: Diagnosis not present

## 2016-11-06 ENCOUNTER — Other Ambulatory Visit: Payer: Self-pay | Admitting: Internal Medicine

## 2016-11-06 DIAGNOSIS — R921 Mammographic calcification found on diagnostic imaging of breast: Secondary | ICD-10-CM

## 2016-11-06 DIAGNOSIS — R928 Other abnormal and inconclusive findings on diagnostic imaging of breast: Secondary | ICD-10-CM

## 2016-11-07 DIAGNOSIS — L989 Disorder of the skin and subcutaneous tissue, unspecified: Secondary | ICD-10-CM | POA: Diagnosis not present

## 2016-11-07 DIAGNOSIS — M79674 Pain in right toe(s): Secondary | ICD-10-CM | POA: Diagnosis not present

## 2016-11-07 DIAGNOSIS — I10 Essential (primary) hypertension: Secondary | ICD-10-CM | POA: Diagnosis not present

## 2016-11-08 DIAGNOSIS — C50919 Malignant neoplasm of unspecified site of unspecified female breast: Secondary | ICD-10-CM

## 2016-11-08 HISTORY — DX: Malignant neoplasm of unspecified site of unspecified female breast: C50.919

## 2016-11-12 DIAGNOSIS — Z85528 Personal history of other malignant neoplasm of kidney: Secondary | ICD-10-CM | POA: Diagnosis not present

## 2016-11-12 DIAGNOSIS — C642 Malignant neoplasm of left kidney, except renal pelvis: Secondary | ICD-10-CM | POA: Diagnosis not present

## 2016-11-13 ENCOUNTER — Ambulatory Visit
Admission: RE | Admit: 2016-11-13 | Discharge: 2016-11-13 | Disposition: A | Discharge status: Home / Self Care | Payer: 59 | Source: Ambulatory Visit | Attending: Internal Medicine | Admitting: Internal Medicine

## 2016-11-13 DIAGNOSIS — R928 Other abnormal and inconclusive findings on diagnostic imaging of breast: Secondary | ICD-10-CM

## 2016-11-13 DIAGNOSIS — R921 Mammographic calcification found on diagnostic imaging of breast: Secondary | ICD-10-CM | POA: Diagnosis not present

## 2016-11-13 DIAGNOSIS — D0511 Intraductal carcinoma in situ of right breast: Secondary | ICD-10-CM | POA: Insufficient documentation

## 2016-11-13 HISTORY — PX: BREAST BIOPSY: SHX20

## 2016-11-15 ENCOUNTER — Encounter: Payer: Self-pay | Admitting: *Deleted

## 2016-11-15 ENCOUNTER — Other Ambulatory Visit: Payer: Self-pay

## 2016-11-16 ENCOUNTER — Encounter: Payer: Self-pay | Admitting: *Deleted

## 2016-11-16 LAB — SURGICAL PATHOLOGY

## 2016-11-16 NOTE — Progress Notes (Signed)
  Oncology Nurse Navigator Documentation  Navigator Location: [facility]Med Onc (11/16/16 1100)   )Navigator Encounter Type: Telephone (11/16/16 1100)                         Barriers/Navigation Needs: Coordination of Care;Education (11/16/16 1100)                          Time Spent with Patient: 30 (11/16/16 1100)  Scheduled patient for Med/Onc appointment with Dr. Janese Banks on Tuesday 11/20/16 at 0900.  Patient notified.

## 2016-11-16 NOTE — Progress Notes (Signed)
  Oncology Nurse Navigator Documentation  Navigator Location: [facility]c (11/15/16 1700)   )Navigator Encounter Type: Introductory phone call (11/15/16 1700)   Abnormal Finding Date: 11/02/16 (11/15/16 1700) Confirmed Diagnosis Date: 11/13/16 (11/15/16 1700)               Patient Visit Type: Initial (11/15/16 1700)   Barriers/Navigation Needs: Coordination of Care;Education (11/15/16 1700) Education: Accessing Care/ Finding Providers (11/15/16 1700)                        Time Spent with Patient: 30 (11/15/16 1700)   Phoned patient to introduce navigation service, and schedule Surgical, and Med/Onc consults.  Left messages for patient.  She came to [facility] in afternoon, and spoke to [employer].  She was Given Breast Cancer Treatment Handbook/folder with hospital services.  Scheduled to see Dr. Bary Castilla on 11/19/16 at 8:00, then she has to go to [facility] for final visit related to history of renal cancer.

## 2016-11-19 ENCOUNTER — Encounter: Payer: Self-pay | Admitting: General Surgery

## 2016-11-19 ENCOUNTER — Inpatient Hospital Stay: Payer: 59 | Attending: Oncology

## 2016-11-19 ENCOUNTER — Ambulatory Visit (INDEPENDENT_AMBULATORY_CARE_PROVIDER_SITE_OTHER): Payer: 59 | Admitting: General Surgery

## 2016-11-19 ENCOUNTER — Inpatient Hospital Stay: Payer: 59 | Admitting: Oncology

## 2016-11-19 VITALS — BP 136/72 | HR 76 | Resp 12 | Ht 64.0 in | Wt 165.0 lb

## 2016-11-19 DIAGNOSIS — Z8673 Personal history of transient ischemic attack (TIA), and cerebral infarction without residual deficits: Secondary | ICD-10-CM | POA: Insufficient documentation

## 2016-11-19 DIAGNOSIS — Z170 Estrogen receptor positive status [ER+]: Secondary | ICD-10-CM | POA: Insufficient documentation

## 2016-11-19 DIAGNOSIS — Z85528 Personal history of other malignant neoplasm of kidney: Secondary | ICD-10-CM | POA: Diagnosis not present

## 2016-11-19 DIAGNOSIS — N200 Calculus of kidney: Secondary | ICD-10-CM | POA: Insufficient documentation

## 2016-11-19 DIAGNOSIS — Z79899 Other long term (current) drug therapy: Secondary | ICD-10-CM | POA: Insufficient documentation

## 2016-11-19 DIAGNOSIS — C50919 Malignant neoplasm of unspecified site of unspecified female breast: Secondary | ICD-10-CM | POA: Diagnosis not present

## 2016-11-19 DIAGNOSIS — D0511 Intraductal carcinoma in situ of right breast: Secondary | ICD-10-CM | POA: Insufficient documentation

## 2016-11-19 DIAGNOSIS — C649 Malignant neoplasm of unspecified kidney, except renal pelvis: Secondary | ICD-10-CM | POA: Diagnosis not present

## 2016-11-19 DIAGNOSIS — I700 Atherosclerosis of aorta: Secondary | ICD-10-CM | POA: Insufficient documentation

## 2016-11-19 DIAGNOSIS — E785 Hyperlipidemia, unspecified: Secondary | ICD-10-CM | POA: Insufficient documentation

## 2016-11-19 DIAGNOSIS — Z905 Acquired absence of kidney: Secondary | ICD-10-CM | POA: Diagnosis not present

## 2016-11-19 DIAGNOSIS — Z8041 Family history of malignant neoplasm of ovary: Secondary | ICD-10-CM | POA: Insufficient documentation

## 2016-11-19 DIAGNOSIS — Z90710 Acquired absence of both cervix and uterus: Secondary | ICD-10-CM | POA: Insufficient documentation

## 2016-11-19 DIAGNOSIS — Z90722 Acquired absence of ovaries, bilateral: Secondary | ICD-10-CM | POA: Insufficient documentation

## 2016-11-19 DIAGNOSIS — I10 Essential (primary) hypertension: Secondary | ICD-10-CM | POA: Insufficient documentation

## 2016-11-19 NOTE — Progress Notes (Signed)
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 Ramonita Lab  Date of visit: 11/19/16   History of presenting illness- 1.  Patient is a 66 year old female who underwent a bilateral breast mammogram on 11/02/2016 which showed group of heterogeneous calcifications in the superior right breast for which DCIS cannot be excluded.  No evidence of malignancy in the left breast.  2.  Patient underwent biopsy of these calcifications which revealed DCIS micropapillary type with associated calcifications.  Tumor was greater than 90% ER positive and 11-50% PR positive.   3.  Patient has seen Dr. Bary Castilla.  She was deemed to be a candidate for, trial which is looking at standard of care treatment which would be surgery followed by adjuvant radiation and hormone therapy versus hormone therapy alone versus observation  4. She is G5P5L5. Used hormone contraception briefly in the remote past. Had hysterectomy for fibroids in 1990's. Family history significant for ovarian cancer in her sister in the 59's. Personal history of kidney cancer. No other h/o colon, pancreatic, stomach or breast cancer or melanoma   ECOG PS- 0  Pain scale- 0   Review of systems- Review of Systems  Constitutional: Negative for chills, fever, malaise/fatigue and weight loss.  HENT: Negative for congestion, ear discharge and nosebleeds.   Eyes: Negative for blurred vision.  Respiratory: Negative for cough, hemoptysis, sputum production, shortness of breath and wheezing.   Cardiovascular: Negative for chest pain, palpitations, orthopnea and claudication.  Gastrointestinal: Negative for abdominal pain, blood in stool, constipation, diarrhea, heartburn, melena,  nausea and vomiting.  Genitourinary: Negative for dysuria, flank pain, frequency, hematuria and urgency.  Musculoskeletal: Negative for back pain, joint pain and myalgias.  Skin: Negative for rash.  Neurological: Negative for dizziness, tingling, focal weakness, seizures, weakness and headaches.  Endo/Heme/Allergies: Does not bruise/bleed easily.  Psychiatric/Behavioral: Negative for depression and suicidal ideas. The patient does not have insomnia.     Allergies  Allergen Reactions  . Sulfamethoxazole-Trimethoprim Rash  . Ciprofloxacin     Other reaction(s): Headache  . Doxycycline Monohydrate     Other reaction(s): Unknown  . Sulfa Antibiotics Other (See Comments)  . Tetracycline Other (See Comments)  . Tetracyclines & Related     Other reaction(s): Unknown  . Bacitracin Rash  . Metronidazole Rash    Patient Active Problem List   Diagnosis Date Noted  . Ductal carcinoma in situ (DCIS) of right breast 11/19/2016  . Hypertension 04/16/2016  . Hyperlipidemia, unspecified 04/16/2016  . Glaucoma 04/16/2016  . History of stroke 07/28/2013  . History of renal cell carcinoma 05/27/2012     Past Medical History:  Diagnosis Date  . Breast cancer ([place])    DCis  . Cancer [facility]) 2013   kidney cancer  . Hyperlipidemia   . Hypertension      Past Surgical History:  Procedure Laterality Date  . ABDOMINAL HYSTERECTOMY    . BREAST BIOPSY Right 11/13/2016   Affirm Bx-path pending  . COLONOSCOPY  2018  . EYE SURGERY  2003  . KIDNEY SURGERY  2013    Social History   Socioeconomic History  . Marital status: Single    Spouse name: Not on file  . Number of children: 5  . Years of education: Not  on file  . Highest education level: GED or equivalent  Social Needs  . Financial resource strain: Not hard at all  . Food insecurity - worry: Never true  . Food insecurity - inability: Never true  . Transportation needs - medical: No  . Transportation needs - non-medical: No    Occupational History  . Occupation: Child psychotherapist: [employer]  Tobacco Use  . Smoking status: Never Smoker  . Smokeless tobacco: Never Used  Substance and Sexual Activity  . Alcohol use: No    Alcohol/week: 0.0 oz  . Drug use: No  . Sexual activity: Yes  Other Topics Concern  . Not on file  Social History Narrative  . Not on file     Family History  Problem Relation Age of Onset  . Ovarian cancer Sister   . Breast cancer Neg Hx      Current Outpatient Medications:  .  albuterol (PROVENTIL HFA;VENTOLIN HFA) 108 (90 Base) MCG/ACT inhaler, Inhale 2 puffs into the lungs every 6 (six) hours as needed for wheezing or shortness of breath., Disp: 1 Inhaler, Rfl: 0 .  aspirin EC 81 MG tablet, Take by mouth., Disp: , Rfl:  .  azelastine (OPTIVAR) 0.05 % ophthalmic solution, 1 drop 2 (two) times daily., Disp: , Rfl:  .  Calcium-Vitamin D 600-200 MG-UNIT per tablet, Take by mouth., Disp: , Rfl:  .  Cholecalciferol (VITAMIN D3) 2000 UNITS capsule, Take by mouth., Disp: , Rfl:  .  levobunolol (BETAGAN) 0.5 % ophthalmic solution, Place 1 drop in affected eye three times a day, Disp: , Rfl:  .  lovastatin (MEVACOR) 40 MG tablet, TAKE ONE TABLET BY MOUTH AT BEDTIME, Disp: , Rfl:  .  metoprolol tartrate (LOPRESSOR) 25 MG tablet, TAKE ONE TABLET ONCE DAILY FOR BLOOD PRESSURE, Disp: , Rfl:  .  Multiple Vitamins-Minerals (CENTRUM SILVER) tablet, Take by mouth., Disp: , Rfl:    Physical exam:  Vitals:   11/20/16 1004  BP: 124/84  Pulse: 69  Resp: 18  Temp: (!) 97.1 F (36.2 C)  TempSrc: Tympanic  Weight: 167 lb 4.8 oz (75.9 kg)  Height: _0  (1.626 m)   Physical Exam  Constitutional: She is oriented to person, place, and time and well-developed, well-nourished, and in no distress.  HENT:  Head: Normocephalic and atraumatic.  Eyes: EOM are normal. Pupils are equal, round, and reactive to light.  Neck: Normal range of motion.  Cardiovascular: Normal rate, regular  rhythm and normal heart sounds.  Pulmonary/Chest: Effort normal and breath sounds normal.  Abdominal: Soft. Bowel sounds are normal.  Neurological: She is alert and oriented to person, place, and time.  Skin: Skin is warm and dry.  No evidence of palpable breast mass bilaterally.  No evidence of axillary adenopathy bilaterally.    CMP Latest Ref Rng & Units 10/25/2016  Creatinine 0.44 - 1.00 mg/dL 0.90   No flowsheet data found.  No images are attached to the encounter.  Ct Abdomen W Contrast  Result Date: 10/25/2016 CLINICAL DATA:  Status post partial left nephrectomy in 2013 for renal cell carcinoma. Patient presents for routine surveillance. No acute symptoms are reported. EXAM: CT ABDOMEN WITH CONTRAST TECHNIQUE: Multidetector CT imaging of the abdomen was performed using the standard protocol following bolus administration of intravenous contrast. CONTRAST:  188m ISOVUE-300 IOPAMIDOL (ISOVUE-300) INJECTION 61% COMPARISON:  10/11/2015 CT abdomen. FINDINGS: Lower chest: No significant pulmonary nodules or acute consolidative airspace disease. Hepatobiliary: Normal liver with no liver mass. Normal  gallbladder with no radiopaque cholelithiasis. No biliary ductal dilatation. Pancreas: Normal, with no mass or duct dilation. Spleen: Normal size. No mass. Adrenals/Urinary Tract: Normal adrenals. No hydronephrosis. Stable appearance of the upper left kidney status post partial nephrectomy, with no new mass or fluid collection at the partial nephrectomy site. Stable mild scarring in the upper right kidney. Nonobstructing 2 mm upper right renal stone. Hypodense sub 5 mm renal cortical lesions in the posterior interpolar kidneys bilaterally are too small to characterize and not appreciably changed, considered benign, requiring no follow-up. No new renal masses. Stomach/Bowel: Grossly normal stomach. Visualized small and large bowel is normal caliber, with no bowel wall thickening. Minimal left colonic  diverticulosis. Vascular/Lymphatic: Atherosclerotic nonaneurysmal abdominal aorta. Patent portal, splenic, hepatic and renal veins. No pathologically enlarged lymph nodes in the abdomen. Other: No pneumoperitoneum, ascites or focal fluid collection. Musculoskeletal: No aggressive appearing focal osseous lesions. Mild-to-moderate thoracolumbar spondylosis. IMPRESSION: 1. No evidence of local tumor recurrence at the partial nephrectomy site in the upper left kidney. 2. No evidence of metastatic disease in the abdomen. 3. Nonobstructing 2 mm upper right renal stone. 4.  Aortic Atherosclerosis (ICD10-I70.0). Electronically Signed   By: Ilona Sorrel M.D.   On: 10/25/2016 12:17   Mm Diag Breast Tomo Bilateral  Result Date: 11/02/2016 CLINICAL DATA:  Two year follow-up of calcifications in the right breast. The patient is asymptomatic. She is also due for an annual examination of the left breast. EXAM: 2D DIGITAL DIAGNOSTIC BILATERAL MAMMOGRAM WITH CAD AND ADJUNCT TOMO COMPARISON:  09/27/2015, 09/03/2014, 08/27/2014 ACR Breast Density Category c: The breast tissue is heterogeneously dense, which may obscure small masses. FINDINGS: No mass or architectural distortion is identified in either breast. There are scattered benign-appearing calcifications bilaterally. In The deep upper central/slightly inner right breast is a group of heterogeneous calcifications that do not layer on today's 90 degree lateral view, and for which ductal carcinoma in situ cannot be excluded. The calcifications span of 11 x 13 x 15 mm. No associated mass. Vascular calcifications are also noted bilaterally. Mammographic images were processed with CAD. IMPRESSION: Group of heterogeneous calcifications in the superior right breast, for which ductal carcinoma in situ cannot be excluded. No evidence of malignancy in the left breast. RECOMMENDATION: Stereotactic biopsy is recommended. This recommendation and the procedure for biopsy was discussed in  detail with the patient today. I have discussed the findings and recommendations with the patient. Results were also provided in writing at the conclusion of the visit. If applicable, a reminder letter will be sent to the patient regarding the next appointment. BI-RADS CATEGORY  4: Suspicious. Electronically Signed   By: Curlene Dolphin M.D.   On: 11/02/2016 14:57   Mm Clip Placement Right  Result Date: 11/13/2016 CLINICAL DATA:  Status post stereotactic core biopsy right breast calcifications. EXAM: DIAGNOSTIC RIGHT MAMMOGRAM POST STEREOTACTIC BIOPSY COMPARISON:  Previous exam(s). FINDINGS: Mammographic images were obtained following right breast stereotactic guided biopsy of calcifications in the medial upper right breast. CC and lateral views of the right breast demonstrate ribbon biopsy clip 1.5 cm inferior to the area biopsy. Residual calcifications are present. IMPRESSION: Post biopsy mammogram as described. Final Assessment: Post Procedure Mammograms for Marker Placement Electronically Signed   By: Abelardo Diesel M.D.   On: 11/13/2016 13:19    Assessment and plan- Patient is a 66 y.o. female with right breast ER positive DCIS  Discussed family history as above and that she should qualify for genetic testing. If she were to  have BRCA 1 or 2 mutations, I would recommend bilateral prophylactic mastectomy bilateral salpingo oopherectomy  If she does not have BRCA 1 or 2 mutations- given that she has intermediate grade DCIS- she would be eligible for COMET trial. Discussed that it is a randomized control trial looking at standard of care treatment which include surgery followed by adjuvant radiation and hormone therapy for 5 years versus hormone therapy alone versus observation.  Research team has also met with her today and she would like to think about it if she would want to enroll in the clinical trial   I will see her back in 3 weeks time after her genetic testing results are back.  At that time she  will decide about proceeding with standard of care versus Comet trial based on genetic testing results  I will discuss side effects of endocrine therapy based on her decision  Thank you for this kind referral and the opportunity to participate in the care of this patient   Visit Diagnosis 1. Ductal carcinoma in situ (DCIS) of right breast     Dr. Randa Evens, MD, MPH [place] at [facility] Pager- [phone] 11/20/2016 1:30 PM

## 2016-11-19 NOTE — Progress Notes (Signed)
Patient ID: Makayla Rice, female   DOB: 06/21/1950, 66 y.o.   MRN: [id number]  Chief Complaint  Patient presents with  . Other    HPI Makayla Rice is a 66 y.o. female who presents for a breast evaluation. The most recent mammogram was done on 11/02/2016 and right breast biopsy done on 11/13/2016. Marland Kitchen  Patient doesn't  perform regular self breast checks and gets regular mammograms done.  Patient is scheduled with Dr. Janese Rice on 11/20/2016 @ 9:ooam. .HPI  Past Medical History:  Diagnosis Date  . Cancer [facility]) 2013   kidney cancer  . Hyperlipidemia   . Hypertension     Past Surgical History:  Procedure Laterality Date  . ABDOMINAL HYSTERECTOMY    . BREAST BIOPSY Right 11/13/2016   Affirm Bx-path pending  . COLONOSCOPY  2018  . EYE SURGERY  2003  . KIDNEY SURGERY  2013    Family History  Problem Relation Age of Onset  . Ovarian cancer Sister   . Breast cancer Neg Hx     Social History Social History   Tobacco Use  . Smoking status: Never Smoker  . Smokeless tobacco: Never Used  Substance Use Topics  . Alcohol use: No    Alcohol/week: 0.0 oz  . Drug use: No    Allergies  Allergen Reactions  . Sulfamethoxazole-Trimethoprim Rash  . Ciprofloxacin     Other reaction(s): Headache  . Doxycycline Monohydrate     Other reaction(s): Unknown  . Sulfa Antibiotics Other (See Comments)  . Tetracycline Other (See Comments)  . Tetracyclines & Related     Other reaction(s): Unknown  . Bacitracin Rash  . Metronidazole Rash    Current Outpatient Medications  Medication Sig Dispense Refill  . albuterol (PROVENTIL HFA;VENTOLIN HFA) 108 (90 Base) MCG/ACT inhaler Inhale 2 puffs into the lungs every 6 (six) hours as needed for wheezing or shortness of breath. 1 Inhaler 0  . aspirin EC 81 MG tablet Take by mouth.    Marland Kitchen azelastine (OPTIVAR) 0.05 % ophthalmic solution 1 drop 2 (two) times daily.    . Calcium-Vitamin D 600-200 MG-UNIT per tablet Take by mouth.    . Cholecalciferol  (VITAMIN D3) 2000 UNITS capsule Take by mouth.    Marland Kitchen levobunolol (BETAGAN) 0.5 % ophthalmic solution Place 1 drop in affected eye three times a day    . lovastatin (MEVACOR) 40 MG tablet TAKE ONE TABLET BY MOUTH AT BEDTIME    . metoprolol tartrate (LOPRESSOR) 25 MG tablet TAKE ONE TABLET ONCE DAILY FOR BLOOD PRESSURE    . Multiple Vitamins-Minerals (CENTRUM SILVER) tablet Take by mouth.     No current facility-administered medications for this visit.     Review of Systems Review of Systems  Constitutional: Negative.   Respiratory: Negative.   Cardiovascular: Negative.     Blood pressure 136/72, pulse 76, resp. rate 12, height 5\' 4"  (1.626 m), weight 165 lb (74.8 kg).  Physical Exam Physical Exam  Constitutional: She is oriented to person, place, and time. She appears well-developed and well-nourished.  Eyes: Conjunctivae are normal. No scleral icterus.  Neck: Neck supple.  Cardiovascular: Normal rate, regular rhythm and normal heart sounds.  Pulmonary/Chest: Effort normal and breath sounds normal. Right breast exhibits no inverted nipple, no mass, no nipple discharge, no skin change and no tenderness. Left breast exhibits no inverted nipple, no mass, no nipple discharge, no skin change and no tenderness.    The right breast is less than 10% smaller than the left.  Lymphadenopathy:  She has no cervical adenopathy.    She has no axillary adenopathy.  Neurological: She is alert and oriented to person, place, and time.  Skin: Skin is warm and dry.    Data Reviewed 2014 through 2018 mammogram is reviewed.  Microcalcifications clearly evident back in 2016 with modest change from 8 mm diameter to 13 mm diameter over the next 2 years.  Biopsy of November 13, 2016: DIAGNOSIS:  A. BREAST CALCIFICATIONS, RIGHT UPPER MEDIAL; STEREOTACTIC BIOPSY:  - DUCTAL CARCINOMA IN SITU, MICROPAPILLARY TYPE, WITH ASSOCIATED  CALCIFICATIONS.  BREAST BIOMARKER TESTS  Estrogen Receptor (ER) Status:  Positive, greater than 90% nuclear  staining    Average intensity of staining: Strong  Progesterone Receptor (PgR) Status: Positive, 11-50% range of nuclear  staining    Average intensity of staining: Moderate   Assessment    Intermediate grade DCIS.    Plan    The patient is a candidate for the COMET trial.  The pros and cons of study participation reviewed.  Standard treatment protocol of surgery, radiation and antiestrogen therapy discussed.  Randomization to standard therapy versus antiestrogen therapy alone if study participant reviewed.  The patient reports she is still sexually active and had questions regarding this would affect her libido or comfort of intercourse.  This could certainly be addressed after the fact and antiestrogen therapy would be appropriate in either arm of the study.  The patient's sister had ovarian cancer, and she may well qualify for genetic testing.  If positive this would likely impact her ability to participate in a clinical trial.  The patient's sister has passed, and is not available for testing.       HPI, Physical Exam, Assessment and Plan have been scribed under the direction and in the presence of Makayla Ard, MD.  Makayla Rice, CMA  I have completed the exam and reviewed the above documentation for accuracy and completeness.  I agree with the above.  Haematologist has been used and any errors in dictation or transcription are unintentional.  Makayla Rice, M.D., F.A.C.S.    Makayla Rice 11/19/2016, 9:12 AM

## 2016-11-19 NOTE — Progress Notes (Signed)
  Oncology Nurse Navigator Documentation  Navigator Location: [facility]Med Onc (11/19/16 0800)   )Navigator Encounter Type: Clinic/MDC (11/19/16 0800)                     Patient Visit Type: Initial (11/19/16 0800)   Barriers/Navigation Needs: Coordination of Care (11/19/16 0800) Education: Newly Diagnosed Cancer Education;Coping with Diagnosis/ Prognosis (11/19/16 0800)                        Time Spent with Patient: 45 (11/19/16 0800)   Patient came by the cancer center looking for [place].  States Webb Silversmith had been looking for her.  Patient is an employee here at [employer].  Explained that Webb Silversmith was with a patient, but I could go over what Webb Silversmith wanted to discuss with her.  Reviewed patient's understanding of her new diagnosis.  Gave patient breast cancer educational literature, "My Breast Cancer Treatment Handbook" by Josephine Igo, RN.  Patient would like to see Dr. Bary Castilla.  Scheduled her to see him on Monday at 8:15.  States she has an appointment at [facility] on Monday at 11:30 for follow-up of her kidney cancer.  States she was diagnosed 5 years ago.  Denies any treatment for her renal cancer other than surgery.  Family history of ovarian cancer in her sister in her 70's.  Also states her son has some type of cancer.  Discussed that Dr. Bary Castilla or the oncologist may talk to her about the possibility of genetic testing since her sister had ovarian cancer.  She is not aware if her sister had genetic testing.  She is to call if she has any questions or needs.

## 2016-11-20 ENCOUNTER — Encounter: Payer: Self-pay | Admitting: Oncology

## 2016-11-20 ENCOUNTER — Encounter: Payer: Self-pay | Admitting: *Deleted

## 2016-11-20 ENCOUNTER — Inpatient Hospital Stay (HOSPITAL_BASED_OUTPATIENT_CLINIC_OR_DEPARTMENT_OTHER): Payer: 59 | Admitting: Oncology

## 2016-11-20 VITALS — BP 124/84 | HR 69 | Temp 97.1°F | Resp 18 | Ht 64.0 in | Wt 167.3 lb

## 2016-11-20 DIAGNOSIS — D0511 Intraductal carcinoma in situ of right breast: Secondary | ICD-10-CM

## 2016-11-20 DIAGNOSIS — Z90710 Acquired absence of both cervix and uterus: Secondary | ICD-10-CM | POA: Diagnosis not present

## 2016-11-20 DIAGNOSIS — Z170 Estrogen receptor positive status [ER+]: Secondary | ICD-10-CM | POA: Diagnosis not present

## 2016-11-20 DIAGNOSIS — I700 Atherosclerosis of aorta: Secondary | ICD-10-CM | POA: Diagnosis not present

## 2016-11-20 DIAGNOSIS — Z8041 Family history of malignant neoplasm of ovary: Secondary | ICD-10-CM

## 2016-11-20 DIAGNOSIS — I10 Essential (primary) hypertension: Secondary | ICD-10-CM

## 2016-11-20 DIAGNOSIS — Z90722 Acquired absence of ovaries, bilateral: Secondary | ICD-10-CM | POA: Diagnosis not present

## 2016-11-20 DIAGNOSIS — E785 Hyperlipidemia, unspecified: Secondary | ICD-10-CM | POA: Diagnosis not present

## 2016-11-20 DIAGNOSIS — Z8673 Personal history of transient ischemic attack (TIA), and cerebral infarction without residual deficits: Secondary | ICD-10-CM

## 2016-11-20 DIAGNOSIS — Z79899 Other long term (current) drug therapy: Secondary | ICD-10-CM | POA: Diagnosis not present

## 2016-11-20 DIAGNOSIS — N200 Calculus of kidney: Secondary | ICD-10-CM

## 2016-11-20 DIAGNOSIS — Z85528 Personal history of other malignant neoplasm of kidney: Secondary | ICD-10-CM

## 2016-11-20 NOTE — Progress Notes (Signed)
New pt with dcis

## 2016-11-20 NOTE — Progress Notes (Signed)
  Oncology Nurse Navigator Documentation  Navigator Location: [facility] (11/20/16 1100)   )Navigator Encounter Type: Initial MedOnc;Clinic/MDC (11/20/16 1100)                     Patient Visit Type: Initial;MedOnc (11/20/16 1100)   Barriers/Navigation Needs: Education;Coordination of Care (11/20/16 1100) Education: Newly Diagnosed Cancer Education;Coping with Diagnosis/ Prognosis;Understanding Cancer/ Treatment Options (11/20/16 1100)                        Time Spent with Patient: 75 (11/20/16 1100)   Met with patient at initial Med Onc visit with Dr. Janese Banks.  Dr. Janese Banks thosroughly explained treatment options with final plan to be determined based on genetic testing.  Yolande Jolly and Cornelia Copa discussed Comet trial with patient.  Genetic testing sent .  Patient to return 12/11/16 at 10:00 for results, and treatment plan.

## 2016-12-10 ENCOUNTER — Telehealth: Payer: Self-pay | Admitting: *Deleted

## 2016-12-10 NOTE — Telephone Encounter (Signed)
Called the pt to tell her the genetic test is negative.  Wanted to know if she was still interested in comet trial and she called back and said that she spoke toher children and they do not want her to do trial.  Since that is the answer thenDr. Janese Banks said that she does not need to come tom. And we will send message to Dr. Bary Castilla to see her and she already has an appt 12/12 and she does not want it moved up.  I did call cindy in research and let her know that pt declined the trial

## 2016-12-10 NOTE — Progress Notes (Deleted)
Hematology/Oncology Consult note [facility]  [phone] [phone]  Patient Care Team: Adin Hector, MD as PCP - General (Internal Medicine)   Name of the patient: Makayla Rice  [id number]  February 28, 1950   Date of visit: 12/10/16  Diagnosis- right breast DCIS  Chief complaint/ Reason for visit- discuss management options  Heme/Onc history: 1.  Patient is a 66 year old female who underwent a bilateral breast mammogram on 11/02/2016 which showed group of heterogeneous calcifications in the superior right breast for which DCIS cannot be excluded.  No evidence of malignancy in the left breast.  2.  Patient underwent biopsy of these calcifications which revealed DCIS micropapillary type with associated calcifications.  Tumor was greater than 90% ER positive and 11-50% PR positive.   3.  Patient has seen Dr. Bary Castilla.  She was deemed to be a candidate for, trial which is looking at standard of care treatment which would be surgery followed by adjuvant radiation and hormone therapy versus hormone therapy alone versus observation  4. She is G5P5L5. Used hormone contraception briefly in the remote past. Had hysterectomy for fibroids in 1990's. Family history significant for ovarian cancer in her sister in the 38's. Personal history of kidney cancer. No other h/o colon, pancreatic, stomach or breast cancer or melanoma     Interval history- ***  ECOG PS- *** Pain scale- *** Opioid associated constipation- ***  Review of systems- ROS   Current treatment- ***  Allergies  Allergen Reactions  . Sulfamethoxazole-Trimethoprim Rash  . Ciprofloxacin     Other reaction(s): Headache  . Doxycycline Monohydrate     Other reaction(s): Unknown  . Sulfa Antibiotics Other (See Comments)  . Tetracycline Other (See Comments)  . Tetracyclines & Related     Other reaction(s): Unknown  . Bacitracin Rash  . Metronidazole Rash     Past  Medical History:  Diagnosis Date  . Breast cancer ([place])    DCis  . Cancer [facility]) 2013   kidney cancer  . Hyperlipidemia   . Hypertension      Past Surgical History:  Procedure Laterality Date  . ABDOMINAL HYSTERECTOMY    . BREAST BIOPSY Right 11/13/2016   Affirm Bx-path pending  . COLONOSCOPY  2018  . EYE SURGERY  2003  . KIDNEY SURGERY  2013    Social History   Socioeconomic History  . Marital status: Single    Spouse name: Not on file  . Number of children: 5  . Years of education: Not on file  . Highest education level: GED or equivalent  Social Needs  . Financial resource strain: Not hard at all  . Food insecurity - worry: Never true  . Food insecurity - inability: Never true  . Transportation needs - medical: No  . Transportation needs - non-medical: No  Occupational History  . Occupation: Child psychotherapist: [employer]  Tobacco Use  . Smoking status: Never Smoker  . Smokeless tobacco: Never Used  Substance and Sexual Activity  . Alcohol use: No    Alcohol/week: 0.0 oz  . Drug use: No  . Sexual activity: Yes  Other Topics Concern  . Not on file  Social History Narrative  . Not on file    Family History  Problem Relation Age of Onset  . Ovarian cancer Sister   . Cancer Son   . Breast cancer Neg Hx      Current Outpatient Medications:  .  albuterol (PROVENTIL HFA;VENTOLIN HFA)  108 (90 Base) MCG/ACT inhaler, Inhale 2 puffs into the lungs every 6 (six) hours as needed for wheezing or shortness of breath., Disp: 1 Inhaler, Rfl: 0 .  aspirin EC 81 MG tablet, Take 81 mg daily by mouth. , Disp: , Rfl:  .  azelastine (OPTIVAR) 0.05 % ophthalmic solution, 1 drop 2 (two) times daily., Disp: , Rfl:  .  Calcium-Vitamin D 600-200 MG-UNIT per tablet, Take 1 tablet daily by mouth. , Disp: , Rfl:  .  Cholecalciferol (VITAMIN D3) 2000 UNITS capsule, Take 2,000 Units daily by mouth. , Disp: , Rfl:  .  levobunolol (BETAGAN) 0.5 % ophthalmic solution, Place  1 drop daily into both eyes. Place 1 drop in affected eye three times a day, Disp: , Rfl:  .  lovastatin (MEVACOR) 40 MG tablet, TAKE ONE TABLET BY MOUTH AT BEDTIME, Disp: , Rfl:  .  metoprolol tartrate (LOPRESSOR) 25 MG tablet, TAKE ONE TABLET ONCE DAILY FOR BLOOD PRESSURE, Disp: , Rfl:  .  Multiple Vitamins-Minerals (CENTRUM SILVER) tablet, Take 1 tablet daily by mouth. , Disp: , Rfl:   Physical exam: There were no vitals filed for this visit. Physical Exam   CMP Latest Ref Rng & Units 10/25/2016  Creatinine 0.44 - 1.00 mg/dL 0.90   No flowsheet data found.  No images are attached to the encounter.  Mm Clip Placement Right  Result Date: 11/13/2016 CLINICAL DATA:  Status post stereotactic core biopsy right breast calcifications. EXAM: DIAGNOSTIC RIGHT MAMMOGRAM POST STEREOTACTIC BIOPSY COMPARISON:  Previous exam(s). FINDINGS: Mammographic images were obtained following right breast stereotactic guided biopsy of calcifications in the medial upper right breast. CC and lateral views of the right breast demonstrate ribbon biopsy clip 1.5 cm inferior to the area biopsy. Residual calcifications are present. IMPRESSION: Post biopsy mammogram as described. Final Assessment: Post Procedure Mammograms for Marker Placement Electronically Signed   By: Abelardo Diesel M.D.   On: 11/13/2016 13:19     Assessment and plan- Patient is a 66 y.o. female ***   Visit Diagnosis No diagnosis found.   Dr. Randa Evens, MD, MPH [place] [facility] Pager- [phone] 12/10/2016 11:47 AM

## 2016-12-11 ENCOUNTER — Inpatient Hospital Stay: Payer: 59 | Admitting: Oncology

## 2016-12-11 NOTE — Progress Notes (Addendum)
  Oncology Nurse Navigator Documentation  Navigator Location: [facility] (12/11/16 0900)   )Navigator Encounter Type: Telephone (12/11/16 0900) Telephone: Incoming Call;Outgoing Call;Appt Confirmation/Clarification;Financial Assistance (12/11/16 0900)                                                  Time Spent with Patient: 30 (12/11/16 0900)   Patient called requesting clarification of treatment plan.  She does not have follow-up scheduled with Dr. Bary Castilla. Spoke to [employer] at [place], who will contact patient with appointment. Patient voiced financial concerns.  Advised patient if she needs assistance with household bills, or groceries, the [employer] is available to her.

## 2016-12-12 NOTE — Progress Notes (Signed)
Oncology Nurse Navigator Documentation      )                                                           Oncology Nurse Navigator Documentation      )                                                           Oncology Nurse Navigator Documentation  Navigator Location: [facility] (12/04/16 1030)   )Navigator Encounter Type: Lobby (12/04/16 1030)                     Patient Visit Type: Follow-up (12/04/16 1030)   Barriers/Navigation Needs: Coordination of Care (12/04/16 1030)                          Time Spent with Patient: 15 (12/04/16 1030)   Met patient in hallway.  She states she has discussed COMET trial with  her family, and has decided not to participate. Questions whether to keep appointment with Dr. Janese Banks on 12/11/16.  Informed patient that appointment may be to discuss genetic testing results, so she should keep appointment.  Responded to email from research leting them know, patient not interested in clinical trial.

## 2016-12-13 ENCOUNTER — Encounter: Payer: Self-pay | Admitting: Oncology

## 2016-12-19 DIAGNOSIS — M5442 Lumbago with sciatica, left side: Secondary | ICD-10-CM | POA: Diagnosis not present

## 2016-12-19 DIAGNOSIS — M5441 Lumbago with sciatica, right side: Secondary | ICD-10-CM | POA: Diagnosis not present

## 2016-12-19 DIAGNOSIS — Z9181 History of falling: Secondary | ICD-10-CM | POA: Diagnosis not present

## 2016-12-19 DIAGNOSIS — M545 Low back pain: Secondary | ICD-10-CM | POA: Diagnosis not present

## 2016-12-19 DIAGNOSIS — S6992XA Unspecified injury of left wrist, hand and finger(s), initial encounter: Secondary | ICD-10-CM | POA: Diagnosis not present

## 2016-12-25 ENCOUNTER — Encounter: Payer: Self-pay | Admitting: General Surgery

## 2016-12-25 ENCOUNTER — Ambulatory Visit (INDEPENDENT_AMBULATORY_CARE_PROVIDER_SITE_OTHER): Payer: 59 | Admitting: General Surgery

## 2016-12-25 ENCOUNTER — Inpatient Hospital Stay: Payer: Self-pay

## 2016-12-25 VITALS — BP 140/88 | HR 84 | Resp 14 | Ht 64.0 in | Wt 160.0 lb

## 2016-12-25 DIAGNOSIS — D0511 Intraductal carcinoma in situ of right breast: Secondary | ICD-10-CM

## 2016-12-25 NOTE — Progress Notes (Signed)
Patient ID: Makayla Rice, female   DOB: 21-Nov-1950, 66 y.o.   MRN: [id number]  Chief Complaint  Patient presents with  . Breast Cancer    HPI Makayla Rice is a 66 y.o. female here today to discuss breast surgery options.  HPI  Past Medical History:  Diagnosis Date  . Breast cancer ([place])    DCis  . Cancer [facility]) 2013   kidney cancer  . Hyperlipidemia   . Hypertension     Past Surgical History:  Procedure Laterality Date  . ABDOMINAL HYSTERECTOMY    . BREAST BIOPSY Right 11/13/2016   Affirm Bx-path pending  . COLONOSCOPY  2018  . EYE SURGERY  2003  . KIDNEY SURGERY  2013    Family History  Problem Relation Age of Onset  . Ovarian cancer Sister   . Cancer Son   . Breast cancer Neg Hx     Social History Social History   Tobacco Use  . Smoking status: Never Smoker  . Smokeless tobacco: Never Used  Substance Use Topics  . Alcohol use: No    Alcohol/week: 0.0 oz  . Drug use: No    Allergies  Allergen Reactions  . Sulfamethoxazole-Trimethoprim Rash  . Ciprofloxacin     Other reaction(s): Headache  . Doxycycline Monohydrate     Other reaction(s): Unknown  . Sulfa Antibiotics Other (See Comments)  . Tetracycline Other (See Comments)  . Tetracyclines & Related     Other reaction(s): Unknown  . Bacitracin Rash  . Metronidazole Rash    Current Outpatient Medications  Medication Sig Dispense Refill  . albuterol (PROVENTIL HFA;VENTOLIN HFA) 108 (90 Base) MCG/ACT inhaler Inhale 2 puffs into the lungs every 6 (six) hours as needed for wheezing or shortness of breath. 1 Inhaler 0  . aspirin EC 81 MG tablet Take 81 mg daily by mouth.     Marland Kitchen azelastine (OPTIVAR) 0.05 % ophthalmic solution 1 drop 2 (two) times daily.    . Calcium-Vitamin D 600-200 MG-UNIT per tablet Take 1 tablet daily by mouth.     . Cholecalciferol (VITAMIN D3) 2000 UNITS capsule Take 2,000 Units daily by mouth.     Marland Kitchen levobunolol (BETAGAN) 0.5 % ophthalmic solution Place 1 drop daily into both eyes.  Place 1 drop in affected eye three times a day    . lovastatin (MEVACOR) 40 MG tablet TAKE ONE TABLET BY MOUTH AT BEDTIME    . metoprolol tartrate (LOPRESSOR) 25 MG tablet TAKE ONE TABLET ONCE DAILY FOR BLOOD PRESSURE    . Multiple Vitamins-Minerals (CENTRUM SILVER) tablet Take 1 tablet daily by mouth.      No current facility-administered medications for this visit.     Review of Systems Review of Systems  Constitutional: Negative.   Cardiovascular: Negative.     Blood pressure 140/88, pulse 84, resp. rate 14, height 5\' 4"  (1.626 m), weight 160 lb (72.6 kg).  Physical Exam Physical Exam  Constitutional: She is oriented to person, place, and time. She appears well-developed and well-nourished.  HENT:  Mouth/Throat: Oropharynx is clear and moist.  Eyes: Conjunctivae are normal. No scleral icterus.  Neck: Neck supple.  Cardiovascular: Normal rate, regular rhythm and normal heart sounds.  Pulmonary/Chest: Effort normal and breath sounds normal.  Lymphadenopathy:    She has no cervical adenopathy.  Neurological: She is alert and oriented to person, place, and time.  Skin: Skin is warm and dry.  Psychiatric: Her behavior is normal.    Data Reviewed Limited breast ultrasound to determine if  the previous biopsy site is evident was completed. The biopsy site is not evident. Needle localization will be required for wide excision.  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    Plan    We spent about 40 minutes reviewing options for management including participation in the COMET trial versus standard therapy: Wide excision, radiation, antiestrogen therapy. Her family as opposed to her participating in the study, and at this time it appears she's been a defer to their desires.    Discussed surgery  HPI, Physical Exam, Assessment and Plan have been scribed under the direction and in the presence of [name], MD. Karie Fetch, RN  The patient is  scheduled for surgery at [facility] on 03/04/16. She will pre admit by phone. We will call the patient with arrival time for the day of surgery as well as review instructions.  Documented by Lesly Rubenstein LPN  I have completed the exam and reviewed the above documentation for accuracy and completeness.  I agree with the above.  Haematologist has been used and any errors in dictation or transcription are unintentional.  Hervey Ard, M.D., F.A.C.S.   [name] 12/25/2016, 8:06 PM

## 2016-12-25 NOTE — Patient Instructions (Addendum)
The patient is aware to call back for any questions or concerns.  The patient is scheduled for surgery at [facility] on 03/04/16. She will pre admit by phone. We will call the patient with arrival time for the day of surgery as well as review instructions.

## 2016-12-26 ENCOUNTER — Other Ambulatory Visit: Payer: Self-pay | Admitting: General Surgery

## 2016-12-26 DIAGNOSIS — D0511 Intraductal carcinoma in situ of right breast: Secondary | ICD-10-CM

## 2016-12-27 ENCOUNTER — Telehealth: Payer: Self-pay | Admitting: *Deleted

## 2016-12-27 NOTE — Telephone Encounter (Signed)
Message left for patient to call the office.   Surgery has been scheduled for 03-04-17 and we need to review instructions and arrival time day of surgery.

## 2016-12-27 NOTE — Telephone Encounter (Signed)
I have called patient's house and left a message on her voicemail.  Dr. Janese Banks wanted to let the patient know that she will need to see [place] after she has surgery so that Dr. Janese Banks can discuss the fact that she will need to go on some medication that she will take by mouth for several years as well as radiation treatments after surgery.  Asked patient to give a call back so I could talk to her in more detail.

## 2016-12-28 ENCOUNTER — Ambulatory Visit: Payer: Self-pay | Admitting: Nurse Practitioner

## 2016-12-28 VITALS — BP 120/69 | HR 84 | Temp 98.5°F | Resp 16

## 2016-12-28 DIAGNOSIS — J209 Acute bronchitis, unspecified: Secondary | ICD-10-CM

## 2016-12-28 DIAGNOSIS — Z947 Corneal transplant status: Secondary | ICD-10-CM | POA: Diagnosis not present

## 2016-12-28 MED ORDER — PREDNISONE 10 MG (21) PO TBPK: ORAL_TABLET | ORAL | 0 refills | Status: AC

## 2016-12-28 NOTE — Progress Notes (Signed)
Subjective:     Makayla Rice is a 66 y.o. female here for evaluation of a cough. Onset of symptoms was 7 days ago. Symptoms have been unchanged since that time. The cough is productive of clear sputum and is aggravated by nothing. Associated symptoms include: postnasal drip and sputum production. Patient does not have a history of asthma. Patient does not have a history of environmental allergens. Patient has not traveled recently. Patient does not have a history of smoking. Patient informed that she was just dxed with breast cancer.   The following portions of the patient's history were reviewed and updated as appropriate: allergies, current medications and past medical history.  Review of Systems Constitutional: negative Eyes: negative Ears, nose, mouth, throat, and face: positive for rhinorhea Respiratory: positive for cough Cardiovascular: negative Behavioral/Psych: negative    Objective:     BP 120/69   Pulse 84   Temp 98.5 F (36.9 C) (Oral)   Resp 16   SpO2 97%  General appearance: alert, cooperative and no distress Head: Normocephalic, without obvious abnormality, atraumatic Eyes: conjunctivae/corneas clear. PERRL, EOM's intact. Fundi benign. Ears: normal TM's and external ear canals both ears Nose: no sinus tenderness Throat: lips, mucosa, and tongue normal; teeth and gums normal and + post nasal drip Lungs: clear to auscultation bilaterally Heart: regular rate and rhythm, S1, S2 normal, no murmur, click, rub or gallop Skin: Skin color, texture, turgor normal. No rashes or lesions Lymph nodes: Cervical, supraclavicular, and axillary nodes normal. and cervical nodes normal    Assessment:    Acute Bronchitis    Plan:    Explained lack of efficacy of antibiotics in viral disease. Avoid exposure to tobacco smoke and fumes. Call if shortness of breath worsens, blood in sputum, change in character of cough, development of fever or chills, inability to maintain nutrition  and hydration. Avoid exposure to tobacco smoke and fumes. Sterapred dose pack.  Patient will use Ladona Ridgel that she has at home. Patient instructed to f/u if symptoms do not improve.

## 2017-01-03 NOTE — Telephone Encounter (Signed)
Another message left for patient to call the office.

## 2017-01-08 DIAGNOSIS — Z923 Personal history of irradiation: Secondary | ICD-10-CM

## 2017-01-08 HISTORY — DX: Personal history of irradiation: Z92.3

## 2017-01-15 DIAGNOSIS — R300 Dysuria: Secondary | ICD-10-CM | POA: Diagnosis not present

## 2017-01-15 DIAGNOSIS — N200 Calculus of kidney: Secondary | ICD-10-CM | POA: Diagnosis not present

## 2017-01-17 ENCOUNTER — Telehealth: Payer: Self-pay | Admitting: *Deleted

## 2017-01-17 NOTE — Telephone Encounter (Signed)
Called patient today that her voicemail.  Left a message that we wanted to talk to her about why the need for a follow-up appointment here at the cancer center.  It would be for radiation as well as a pill to take for her breast cancer.  Asked her to give me a call back when she gets this message so we can talk further about it and left my number

## 2017-01-17 NOTE — Telephone Encounter (Signed)
-----   Message from Festus Holts sent at 12/27/2016  1:34 PM EST ----- Regarding: Follow up appt Follow up appt schd and conf with patient for 03/12/17 @ 11:45 a.m. Patient would like to receive a call regarding her follow up appointment. ( why she need to be seen again?)     Makayla Rice ----- Message ----- From: Sindy Guadeloupe, MD Sent: 12/27/2016   8:15 AM To: Festus Holts  Please schedule follow up appointment for this patient with me 1st week of march 2019. No labs.  Thanks, Astrid Divine

## 2017-01-23 NOTE — Telephone Encounter (Signed)
Patient called the office and surgery date, time, and instructions were reviewed. She verbalizes understanding.   This patient is aware to call the office if she has further questions.

## 2017-01-29 DIAGNOSIS — R21 Rash and other nonspecific skin eruption: Secondary | ICD-10-CM | POA: Diagnosis not present

## 2017-01-29 DIAGNOSIS — T300 Burn of unspecified body region, unspecified degree: Secondary | ICD-10-CM | POA: Diagnosis not present

## 2017-02-05 DIAGNOSIS — M1712 Unilateral primary osteoarthritis, left knee: Secondary | ICD-10-CM | POA: Diagnosis not present

## 2017-02-08 ENCOUNTER — Telehealth: Payer: Self-pay | Admitting: *Deleted

## 2017-02-08 HISTORY — PX: BREAST LUMPECTOMY: SHX2

## 2017-02-08 NOTE — Telephone Encounter (Signed)
Called pt and told her that I wanted to talk to her about her appt we have scheduled. The patient states that she did talk to someone and she does know about the appt but not sure if it interferes with her dental appt. I told her that she can call me back and let me know if I need to change the time for a later time if it interferes.  She will call if she needs me to change it

## 2017-02-12 DIAGNOSIS — J069 Acute upper respiratory infection, unspecified: Secondary | ICD-10-CM | POA: Diagnosis not present

## 2017-02-25 ENCOUNTER — Encounter
Admission: RE | Admit: 2017-02-25 | Discharge: 2017-02-25 | Disposition: A | Discharge status: Home / Self Care | Payer: 59 | Source: Ambulatory Visit | Attending: General Surgery | Admitting: General Surgery

## 2017-02-25 ENCOUNTER — Other Ambulatory Visit: Payer: Self-pay

## 2017-02-25 DIAGNOSIS — I10 Essential (primary) hypertension: Secondary | ICD-10-CM | POA: Diagnosis not present

## 2017-02-25 HISTORY — DX: Cerebral infarction, unspecified: I63.9

## 2017-02-25 HISTORY — DX: Other complications of anesthesia, initial encounter: T88.59XA

## 2017-02-25 HISTORY — DX: Unspecified osteoarthritis, unspecified site: M19.90

## 2017-02-25 HISTORY — DX: Personal history of urinary calculi: Z87.442

## 2017-02-25 HISTORY — DX: Nausea with vomiting, unspecified: R11.2

## 2017-02-25 HISTORY — DX: Adverse effect of unspecified anesthetic, initial encounter: T41.45XA

## 2017-02-25 HISTORY — DX: Other specified postprocedural states: Z98.890

## 2017-02-25 NOTE — Patient Instructions (Addendum)
Your procedure is scheduled on: 03-04-17 MONDAY Report to [place] @ 7:45 AM  Remember: Instructions that are not followed completely may result in serious medical risk, up to and including death, or upon the discretion of your surgeon and anesthesiologist your surgery may need to be rescheduled.    _x___ 1. Do not eat food after midnight the night before your procedure. NO GUM OR CANDY AFTER MIDNIGHT.  You may drink clear liquids up to 2 hours before you are scheduled to arrive at the hospital for your procedure.  Do not drink clear liquids within 2 hours of your scheduled arrival to the hospital.  Clear liquids include  --Water or Apple juice without pulp  --Clear carbohydrate beverage such as ClearFast or Gatorade  --Black Coffee or Clear Tea (No milk, no creamers, do not add anything to  the coffee or Tea    __x__ 2. No Alcohol for 24 hours before or after surgery.   __x__3. No Smoking or e-cigarettes for 24 prior to surgery.  Do not use any chewable tobacco products for at least 6 hour prior to surgery   ____  4. Bring all medications with you on the day of surgery if instructed.    __x__ 5. Notify your doctor if there is any change in your medical condition     (cold, fever, infections).    x___6. On the morning of surgery brush your teeth with toothpaste and water.  You may rinse your mouth with mouth wash if you wish.  Do not swallow any toothpaste or mouthwash.   Do not wear jewelry, make-up, hairpins, clips or nail polish.  Do not wear lotions, powders, or perfumes. You may wear deodorant.  Do not shave 48 hours prior to surgery. Men may shave face and neck.  Do not bring valuables to the hospital.    [facility] is not responsible for any belongings or valuables.               Contacts, dentures or bridgework may not be worn into surgery.  Leave your suitcase in the car. After surgery it may be brought to your room.  For patients admitted to the hospital,  discharge time is determined by your treatment team.  _  Patients discharged the day of surgery will not be allowed to drive home.  You will need someone to drive you home and stay with you the night of your procedure.    Please read over the following fact sheets that you were given:   [facility] Preparing for Surgery and or MRSA Information   _x___ TAKE THE FOLLOWING MEDICATION THE MORNING OF SURGERY WITH A SMALL SIP OF WATER. These include:  1. METOPROLOL  2.  3.  4.  5.  6.  ____Fleets enema or Magnesium Citrate as directed.   _x___ Use CHG Soap or sage wipes as directed on instruction sheet   _X___ Use inhalers on the day of surgery and bring to hospital day of surgery-USE YOUR ALBUTEROL INHALER AM OF SURGERY AND [place]  ____ Stop Metformin and Janumet 2 days prior to surgery.    ____ Take 1/2 of usual insulin dose the night before surgery and none on the morning surgery.   ____ Follow recommendations from Cardiologist, Pulmonologist or PCP regarding stopping Aspirin, Coumadin, Plavix ,Eliquis, Effient, or Pradaxa, and Pletal-OK TO CONTINUE 81 MG ASPIRIN-DO NOT TAKE AM OF SURGERY  ____Stop Anti-inflammatories such as Advil, Aleve, Ibuprofen, Motrin, Naproxen, Naprosyn, Goodies  powders or aspirin products. OK to take Tylenol   ____ Stop supplements until after surgery   ____ Bring C-Pap to the hospital.

## 2017-02-28 DIAGNOSIS — G8929 Other chronic pain: Secondary | ICD-10-CM | POA: Diagnosis not present

## 2017-02-28 DIAGNOSIS — I10 Essential (primary) hypertension: Secondary | ICD-10-CM | POA: Diagnosis not present

## 2017-02-28 DIAGNOSIS — M25562 Pain in left knee: Secondary | ICD-10-CM | POA: Diagnosis not present

## 2017-02-28 DIAGNOSIS — E785 Hyperlipidemia, unspecified: Secondary | ICD-10-CM | POA: Diagnosis not present

## 2017-02-28 DIAGNOSIS — Z85528 Personal history of other malignant neoplasm of kidney: Secondary | ICD-10-CM | POA: Diagnosis not present

## 2017-03-01 ENCOUNTER — Encounter: Payer: Self-pay | Admitting: *Deleted

## 2017-03-02 ENCOUNTER — Telehealth: Payer: Self-pay | Admitting: General Surgery

## 2017-03-02 NOTE — Telephone Encounter (Signed)
Patient has been battling an upper respiratory infection on and off since December.  Night time coughing, clear sputum. Has seen IM x 2. No real change with po antibiotics after first month. No SOB at rest. Continues to work full time in [employer].  I anticipate she will be OK for Surgery on Feb 25. She should use her regular inhalers that AM.  If she has too much coughing to sit comfortably for needle localization, will need to reschedule.

## 2017-03-04 ENCOUNTER — Ambulatory Visit
Admission: RE | Admit: 2017-03-04 | Discharge: 2017-03-04 | Disposition: A | Discharge status: Home / Self Care | Payer: 59 | Source: Ambulatory Visit | Attending: General Surgery | Admitting: General Surgery

## 2017-03-04 ENCOUNTER — Ambulatory Visit: Payer: 59 | Admitting: Anesthesiology

## 2017-03-04 ENCOUNTER — Other Ambulatory Visit: Payer: Self-pay

## 2017-03-04 ENCOUNTER — Encounter
Admission: RE | Disposition: A | Discharge status: Home / Self Care | Payer: Self-pay | Source: Ambulatory Visit | Attending: General Surgery

## 2017-03-04 DIAGNOSIS — Z85528 Personal history of other malignant neoplasm of kidney: Secondary | ICD-10-CM | POA: Insufficient documentation

## 2017-03-04 DIAGNOSIS — Z8673 Personal history of transient ischemic attack (TIA), and cerebral infarction without residual deficits: Secondary | ICD-10-CM | POA: Diagnosis not present

## 2017-03-04 DIAGNOSIS — D0511 Intraductal carcinoma in situ of right breast: Secondary | ICD-10-CM

## 2017-03-04 DIAGNOSIS — Z905 Acquired absence of kidney: Secondary | ICD-10-CM | POA: Diagnosis not present

## 2017-03-04 DIAGNOSIS — Z79899 Other long term (current) drug therapy: Secondary | ICD-10-CM | POA: Diagnosis not present

## 2017-03-04 DIAGNOSIS — Z86000 Personal history of in-situ neoplasm of breast: Secondary | ICD-10-CM | POA: Diagnosis not present

## 2017-03-04 DIAGNOSIS — C50211 Malignant neoplasm of upper-inner quadrant of right female breast: Secondary | ICD-10-CM | POA: Diagnosis not present

## 2017-03-04 DIAGNOSIS — E785 Hyperlipidemia, unspecified: Secondary | ICD-10-CM | POA: Diagnosis not present

## 2017-03-04 DIAGNOSIS — I10 Essential (primary) hypertension: Secondary | ICD-10-CM | POA: Diagnosis not present

## 2017-03-04 DIAGNOSIS — Z7982 Long term (current) use of aspirin: Secondary | ICD-10-CM | POA: Insufficient documentation

## 2017-03-04 DIAGNOSIS — D0591 Unspecified type of carcinoma in situ of right breast: Secondary | ICD-10-CM | POA: Insufficient documentation

## 2017-03-04 HISTORY — PX: BREAST EXCISIONAL BIOPSY: SUR124

## 2017-03-04 HISTORY — PX: BREAST LUMPECTOMY WITH NEEDLE LOCALIZATION: SHX5759

## 2017-03-04 SURGERY — BREAST LUMPECTOMY WITH NEEDLE LOCALIZATION
Anesthesia: General | Laterality: Right | Wound class: Clean Contaminated

## 2017-03-04 MED ORDER — DEXAMETHASONE SODIUM PHOSPHATE 10 MG/ML IJ SOLN
INTRAMUSCULAR | Status: DC | PRN
  Administered 2017-03-04: 10 mg via INTRAVENOUS

## 2017-03-04 MED ORDER — EPHEDRINE SULFATE 50 MG/ML IJ SOLN
INTRAMUSCULAR | Status: AC
  Filled 2017-03-04: qty 1

## 2017-03-04 MED ORDER — ONDANSETRON HCL 4 MG/2ML IJ SOLN: 4.0000 mg | Freq: Once | INTRAMUSCULAR | Status: DC | PRN

## 2017-03-04 MED ORDER — PHENYLEPHRINE HCL 10 MG/ML IJ SOLN
INTRAMUSCULAR | Status: AC
  Filled 2017-03-04: qty 1

## 2017-03-04 MED ORDER — ONDANSETRON HCL 4 MG/2ML IJ SOLN
INTRAMUSCULAR | Status: AC
  Filled 2017-03-04: qty 2

## 2017-03-04 MED ORDER — HYDROMORPHONE HCL 1 MG/ML IJ SOLN
INTRAMUSCULAR | Status: AC
  Filled 2017-03-04: qty 1

## 2017-03-04 MED ORDER — ACETAMINOPHEN 10 MG/ML IV SOLN
INTRAVENOUS | Status: AC
  Filled 2017-03-04: qty 100

## 2017-03-04 MED ORDER — DEXAMETHASONE SODIUM PHOSPHATE 10 MG/ML IJ SOLN
INTRAMUSCULAR | Status: AC
  Filled 2017-03-04: qty 1

## 2017-03-04 MED ORDER — FAMOTIDINE 20 MG PO TABS
20.0000 mg | ORAL_TABLET | Freq: Once | ORAL | Status: AC
  Administered 2017-03-04: 20 mg via ORAL

## 2017-03-04 MED ORDER — FENTANYL CITRATE (PF) 100 MCG/2ML IJ SOLN: 25.0000 ug | INTRAMUSCULAR | Status: DC | PRN

## 2017-03-04 MED ORDER — GLYCOPYRROLATE 0.2 MG/ML IJ SOLN
INTRAMUSCULAR | Status: AC
  Filled 2017-03-04: qty 1

## 2017-03-04 MED ORDER — BUPIVACAINE-EPINEPHRINE (PF) 0.5% -1:200000 IJ SOLN
INTRAMUSCULAR | Status: DC | PRN
  Administered 2017-03-04: 30 mL via PERINEURAL

## 2017-03-04 MED ORDER — LIDOCAINE HCL (PF) 2 % IJ SOLN
INTRAMUSCULAR | Status: AC
  Filled 2017-03-04: qty 10

## 2017-03-04 MED ORDER — MIDAZOLAM HCL 2 MG/2ML IJ SOLN
INTRAMUSCULAR | Status: DC | PRN
  Administered 2017-03-04: 2 mg via INTRAVENOUS

## 2017-03-04 MED ORDER — ONDANSETRON HCL 4 MG/2ML IJ SOLN
INTRAMUSCULAR | Status: DC | PRN
  Administered 2017-03-04: 4 mg via INTRAVENOUS

## 2017-03-04 MED ORDER — LIDOCAINE HCL (CARDIAC) 20 MG/ML IV SOLN
INTRAVENOUS | Status: DC | PRN
  Administered 2017-03-04: 80 mg via INTRAVENOUS

## 2017-03-04 MED ORDER — FAMOTIDINE 20 MG PO TABS
ORAL_TABLET | ORAL | Status: AC
Start: 2017-03-04 — End: 2017-03-04
  Administered 2017-03-04: 20 mg via ORAL
  Filled 2017-03-04: qty 1

## 2017-03-04 MED ORDER — MIDAZOLAM HCL 2 MG/2ML IJ SOLN
INTRAMUSCULAR | Status: AC
  Filled 2017-03-04: qty 2

## 2017-03-04 MED ORDER — ACETAMINOPHEN 10 MG/ML IV SOLN
INTRAVENOUS | Status: DC | PRN
  Administered 2017-03-04: 1000 mg via INTRAVENOUS

## 2017-03-04 MED ORDER — HYDROCODONE-ACETAMINOPHEN 5-325 MG PO TABS: 1.0000 | ORAL_TABLET | ORAL | 0 refills | Status: DC | PRN

## 2017-03-04 MED ORDER — PROPOFOL 10 MG/ML IV BOLUS
INTRAVENOUS | Status: DC | PRN
  Administered 2017-03-04: 150 mg via INTRAVENOUS

## 2017-03-04 MED ORDER — PHENYLEPHRINE HCL 10 MG/ML IJ SOLN
INTRAMUSCULAR | Status: DC | PRN
  Administered 2017-03-04: 200 ug via INTRAVENOUS
  Administered 2017-03-04 (×2): 100 ug via INTRAVENOUS

## 2017-03-04 MED ORDER — HYDROMORPHONE HCL 1 MG/ML IJ SOLN
INTRAMUSCULAR | Status: DC | PRN
  Administered 2017-03-04: 1 mg via INTRAVENOUS

## 2017-03-04 MED ORDER — PROPOFOL 10 MG/ML IV BOLUS
INTRAVENOUS | Status: AC
  Filled 2017-03-04: qty 20

## 2017-03-04 MED ORDER — EPHEDRINE SULFATE 50 MG/ML IJ SOLN
INTRAMUSCULAR | Status: DC | PRN
  Administered 2017-03-04: 10 mg via INTRAVENOUS

## 2017-03-04 MED ORDER — GLYCOPYRROLATE 0.2 MG/ML IJ SOLN
INTRAMUSCULAR | Status: DC | PRN
  Administered 2017-03-04 (×2): 0.1 mg via INTRAVENOUS

## 2017-03-04 MED ORDER — LACTATED RINGERS IV SOLN
INTRAVENOUS | Status: DC
  Administered 2017-03-04: 09:00:00 via INTRAVENOUS

## 2017-03-04 SURGICAL SUPPLY — 40 items
BINDER BREAST LRG (GAUZE/BANDAGES/DRESSINGS) IMPLANT
BINDER BREAST MEDIUM (GAUZE/BANDAGES/DRESSINGS) IMPLANT
BINDER BREAST XLRG (GAUZE/BANDAGES/DRESSINGS) IMPLANT
BINDER BREAST XXLRG (GAUZE/BANDAGES/DRESSINGS) IMPLANT
BLADE SURG 15 STRL SS SAFETY (BLADE) ×4 IMPLANT
CANISTER SUCT 1200ML W/VALVE (MISCELLANEOUS) ×2 IMPLANT
CHLORAPREP W/TINT 26ML (MISCELLANEOUS) ×4 IMPLANT
CNTNR SPEC 2.5X3XGRAD LEK (MISCELLANEOUS) ×1
CONT SPEC 4OZ STER OR WHT (MISCELLANEOUS) ×1
CONTAINER SPEC 2.5X3XGRAD LEK (MISCELLANEOUS) ×1 IMPLANT
COVER PROBE FLX POLY STRL (MISCELLANEOUS) ×2 IMPLANT
DEVICE DUBIN SPECIMEN MAMMOGRA (MISCELLANEOUS) ×2 IMPLANT
DRAPE CHEST BREAST 77X106 FENE (MISCELLANEOUS) ×2 IMPLANT
DRAPE LAPAROTOMY 100X77 ABD (DRAPES) ×2 IMPLANT
DRSG TELFA 4X3 1S NADH ST (GAUZE/BANDAGES/DRESSINGS) ×4 IMPLANT
ELECT CAUTERY BLADE TIP 2.5 (TIP) ×2
ELECT REM PT RETURN 9FT ADLT (ELECTROSURGICAL) ×2
ELECTRODE CAUTERY BLDE TIP 2.5 (TIP) ×1 IMPLANT
ELECTRODE REM PT RTRN 9FT ADLT (ELECTROSURGICAL) ×1 IMPLANT
GAUZE FLUFF 18X24 1PLY STRL (GAUZE/BANDAGES/DRESSINGS) ×2 IMPLANT
GLOVE BIO SURGEON STRL SZ7.5 (GLOVE) ×2 IMPLANT
GLOVE INDICATOR 8.0 STRL GRN (GLOVE) ×2 IMPLANT
GOWN STRL REUS W/ TWL LRG LVL3 (GOWN DISPOSABLE) ×2 IMPLANT
GOWN STRL REUS W/TWL LRG LVL3 (GOWN DISPOSABLE) ×2
KIT TURNOVER KIT A (KITS) ×2 IMPLANT
LABEL OR SOLS (LABEL) ×2 IMPLANT
MARGIN MAP 10MM (MISCELLANEOUS) ×2 IMPLANT
NEEDLE HYPO 22GX1.5 SAFETY (NEEDLE) ×2 IMPLANT
NEEDLE HYPO 25X1 1.5 SAFETY (NEEDLE) ×2 IMPLANT
PACK BASIN MINOR ARMC (MISCELLANEOUS) ×2 IMPLANT
STRIP CLOSURE SKIN 1/2X4 (GAUZE/BANDAGES/DRESSINGS) ×4 IMPLANT
SUT ETHILON 3-0 FS-10 30 BLK (SUTURE) ×2
SUT VIC AB 2-0 CT1 27 (SUTURE) ×1
SUT VIC AB 2-0 CT1 TAPERPNT 27 (SUTURE) ×1 IMPLANT
SUT VIC AB 4-0 FS2 27 (SUTURE) ×2 IMPLANT
SUTURE EHLN 3-0 FS-10 30 BLK (SUTURE) ×1 IMPLANT
SWABSTK COMLB BENZOIN TINCTURE (MISCELLANEOUS) ×4 IMPLANT
SYR CONTROL 10ML (SYRINGE) ×2 IMPLANT
TAPE TRANSPORE STRL 2 31045 (GAUZE/BANDAGES/DRESSINGS) ×2 IMPLANT
WATER STERILE IRR 1000ML POUR (IV SOLUTION) ×2 IMPLANT

## 2017-03-04 NOTE — Anesthesia Preprocedure Evaluation (Addendum)
Anesthesia Evaluation  Patient identified by MRN, date of birth, ID band Patient awake    Reviewed: Allergy & Precautions, NPO status , Patient's Chart, lab work & pertinent test results  History of Anesthesia Complications (+) PONV  Airway Mallampati: II       Dental   Pulmonary neg sleep apnea, neg COPD,           Cardiovascular hypertension, Pt. on medications (-) Past MI and (-) CHF (-) dysrhythmias (-) Valvular Problems/Murmurs     Neuro/Psych neg Seizures CVA (speech difficulties), No Residual Symptoms    GI/Hepatic Neg liver ROS, neg GERD  ,  Endo/Other  neg diabetes  Renal/GU Renal disease (renal cell carcinoma)     Musculoskeletal   Abdominal   Peds  Hematology   Anesthesia Other Findings   Reproductive/Obstetrics                            Anesthesia Physical Anesthesia Plan  ASA: II  Anesthesia Plan: General   Post-op Pain Management:    Induction: Intravenous  PONV Risk Score and Plan: 3 and Dexamethasone, Ondansetron and Midazolam  Airway Management Planned: LMA  Additional Equipment:   Intra-op Plan:   Post-operative Plan:   Informed Consent: I have reviewed the patients History and Physical, chart, labs and discussed the procedure including the risks, benefits and alternatives for the proposed anesthesia with the patient or authorized representative who has indicated his/her understanding and acceptance.     Plan Discussed with:   Anesthesia Plan Comments:         Anesthesia Quick Evaluation

## 2017-03-04 NOTE — H&P (Signed)
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 For wire localized wide excision.   Medications Prior to Admission  Medication Sig Dispense Refill Last Dose  . albuterol (PROVENTIL HFA;VENTOLIN HFA) 108 (90 Base) MCG/ACT inhaler Inhale 2 puffs into the lungs every 6 (six) hours as needed for wheezing or shortness of breath. 1 Inhaler 0 03/04/2017 at Unknown time  . aspirin EC 81 MG tablet Take 81 mg daily by mouth.    03/03/2017 at Unknown time  . azelastine (ASTELIN) 0.1 % nasal spray Place 1 spray into both nostrils daily as needed. For cold symptoms  5 Past Week at Unknown time  . benzonatate (TESSALON) 200 MG capsule Take 200 mg by mouth 3 (three) times daily as needed for cough.  0 03/02/2017  . Calcium Carbonate-Vit D-Min (CALCIUM 600+D3 PLUS MINERALS PO) Take 1 tablet by mouth daily.   Past Week at Unknown time  . Cholecalciferol (VITAMIN D3) 2000 UNITS capsule Take 2,000 Units daily by mouth.    Past Week at Unknown time  . hydrocortisone 2.5 % ointment Apply 1 application topically 2 (two) times daily as needed. For affected areas of skin  2 Past Month at Unknown time  . levobunolol (BETAGAN) 0.5 % ophthalmic solution Place 1 drop into both eyes daily.    03/04/2017 at Unknown time  . lovastatin (MEVACOR) 40 MG tablet Take 80 mg by mouth at bedtime.    Past Week at Unknown time  . metoprolol tartrate (LOPRESSOR) 25 MG tablet Take 25 mg by mouth every morning. TAKE ONE TABLET ONCE DAILY FOR BLOOD PRESSURE   03/04/2017 at 0630  . Multiple Vitamin (MULTIVITAMIN WITH MINERALS) TABS tablet Take 1 tablet by mouth daily. Centrum Silver   Past Week at Unknown time  . prednisoLONE acetate (PRED FORTE) 1 % ophthalmic suspension Place 1 drop into the left eye daily.   03/04/2017 at Unknown time  . azithromycin (ZITHROMAX) 250 MG tablet Take 250-500 mg by mouth daily. Take 2 tablets (500 mg) by mouth on day 1, then take 1 tablet (250 mg) by mouth on days 2-5.   Completed  Course at Unknown time  . loratadine (CLARITIN) 10 MG tablet Take 10 mg by mouth daily as needed for allergies.   Not Taking at Unknown time   Allergies  Allergen Reactions  . Sulfamethoxazole-Trimethoprim Rash  . Ciprofloxacin     Other reaction(s): Headache  . Doxycycline Monohydrate     Other reaction(s): Unknown  . Sulfa Antibiotics Other (See Comments)  . Tetracycline Other (See Comments)  . Tetracyclines & Related     Other reaction(s): Unknown  . Bacitracin Rash  . Metronidazole Rash   Past Medical History:  Diagnosis Date  . Arthritis   . Breast cancer ([place])    DCis  . Cancer [facility]) 2013   kidney cancer  . Complication of anesthesia   . History of kidney stones   . Hyperlipidemia   . Hypertension   . PONV (postoperative nausea and vomiting)    AFTER ROBOTIC KIDNEY SURGERY  . 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 ABDOMINAL HYSTERECTOMY    . BREAST BIOPSY Right 11/13/2016   Affirm Bx-path DCIS  . BREAST EXCISIONAL BIOPSY Right 03/04/2017   lumpectomy wit np DCIS   . COLONOSCOPY  2018  . EYE SURGERY  2003  . KIDNEY SURGERY  2013   ROBOTIC SURGERY  .  PARTIAL NEPHRECTOMY  2013   Social History   Socioeconomic History  . Marital status: Single    Spouse name: Not on file  . Number of children: 5  . Years of education: Not on file  . Highest education level: GED or equivalent  Social Needs  . Financial resource strain: Not hard at all  . Food insecurity - worry: Never true  . Food insecurity - inability: Never true  . Transportation needs - medical: No  . Transportation needs - non-medical: No  Occupational History  . Occupation: Child psychotherapist: [employer]  Tobacco Use  . Smoking status: Never Smoker  . Smokeless tobacco: Never Used  Substance and Sexual Activity  . Alcohol use: Yes    Alcohol/week: 0.0 oz    Comment: WINE RARELY  . Drug use: No  . Sexual activity: Yes  Other  Topics Concern  . Not on file  Social History Narrative  . Not on file   Social History   Social History Narrative  . Not on file     ROS:  Recent cough productive of clear sputum. No fever/ chills.  PE: HEENT: Negative. Lungs: Clear. Cardio: RR.  Assessment/Plan:  Proceed with planned right breast wide excision.   Forest Gleason [facility] [address] 03/04/2017

## 2017-03-04 NOTE — Anesthesia Procedure Notes (Signed)
Procedure Name: LMA Insertion Date/Time: 03/04/2017 10:50 AM Performed by: Nile Riggs, CRNA Pre-anesthesia Checklist: Patient identified, Emergency Drugs available, Suction available, Patient being monitored and Timeout performed Patient Re-evaluated:Patient Re-evaluated prior to induction Oxygen Delivery Method: Circle system utilized Preoxygenation: Pre-oxygenation with 100% oxygen Induction Type: IV induction Ventilation: Mask ventilation without difficulty LMA: LMA inserted LMA Size: 3.5 Number of attempts: 1 Placement Confirmation: positive ETCO2,  CO2 detector and breath sounds checked- equal and bilateral Tube secured with: Tape Dental Injury: Teeth and Oropharynx as per pre-operative assessment

## 2017-03-04 NOTE — Transfer of Care (Signed)
Immediate Anesthesia Transfer of Care Note  Patient: Makayla Rice  Procedure(s) Performed: BREAST LUMPECTOMY WITH NEEDLE LOCALIZATION (Right )  Patient Location: PACU  Anesthesia Type:General  Level of Consciousness: drowsy and patient cooperative  Airway & Oxygen Therapy: Patient Spontanous Breathing and Patient connected to face mask oxygen  Post-op Assessment: Report given to RN, Post -op Vital signs reviewed and stable and Patient moving all extremities X 4  Post vital signs: Reviewed and stable  Last Vitals:  Vitals:   03/04/17 0839 03/04/17 1200  BP: (!) 162/80 (!) 158/80  Pulse: 69 77  Resp: 16 11  Temp: (!) 36.4 C (!) 36.3 C  SpO2: 99% 100%    Last Pain:  Vitals:   03/04/17 0839  TempSrc: Temporal         Complications: No apparent anesthesia complications

## 2017-03-04 NOTE — Anesthesia Post-op Follow-up Note (Signed)
Anesthesia QCDR form completed.        

## 2017-03-04 NOTE — Anesthesia Postprocedure Evaluation (Signed)
Anesthesia Post Note  Patient: Makayla Rice  Procedure(s) Performed: BREAST LUMPECTOMY WITH NEEDLE LOCALIZATION (Right )  Patient location during evaluation: PACU Anesthesia Type: General Level of consciousness: awake and alert Pain management: pain level controlled Vital Signs Assessment: post-procedure vital signs reviewed and stable Respiratory status: spontaneous breathing and respiratory function stable Cardiovascular status: stable Anesthetic complications: no     Last Vitals:  Vitals:   03/04/17 1233 03/04/17 1242  BP: (!) 166/99 (!) 165/86  Pulse: 78 77  Resp: 11 16  Temp:  36.4 C  SpO2: 98% 97%    Last Pain:  Vitals:   03/04/17 1242  TempSrc: Temporal                 KEPHART,WILLIAM K

## 2017-03-04 NOTE — Op Note (Signed)
Preoperative diagnosis: High-grade DCIS right breast.  Postoperative diagnosis: Same.  Operative procedure: Wide excision with wire localization and ultrasound guidance.  Operating Surgeon: Hervey Ard, MD.  Anesthesia: General by LMA, Marcaine 0.5% with 1-200,000 units of epinephrine, 30 cc.  Estimated blood loss: Less than 5 cc.  Clinical note: This 67 year old woman had mammographic evidence of new calcifications underwent stereotactic biopsy showing evidence of DCIS.  She is felt to be a candidate for wide excision.  Location does not preclude later sentinel node biopsy if invasive malignancy is identified.  The patient underwent wire localization with the radiology department early this morning.  The radiologist put the thickened portion of the wire at the biopsy clip with excellent positioning noted.  Operative note: The patient had SCD stockings for DVT prevention.  She underwent general anesthesia without difficulty.  The right breast chest and neck was cleansed with ChloraPrep and draped.  Ultrasound was used to identify the thickened portion of the wire and this was outlined.  Local anesthesia was infiltrated well away from the planned site of incision.  A curvilinear incision at the 12 o'clock position was made and carried down through skin subtendinous tissue with hemostasis achieved by electrocautery. This was about 1.5 cm in thickness including all of the subcutaneous fat.  The localizing wire was brought into the wound followed by placement of an Cedar wound protector.  A block of tissue 3 x 4 x 3 cm in diameter was orientated and specimen radiograph confirmed a the clip and be the wire tip.  Calcifications is noted on the prebiopsy films were included as well.  The specimen was sent fresh to pathology for routine processing.  After achieving final hemostasis with electrocautery the adipose layer was approximated the deep level with interrupted 2-0 Vicryl figure-of-eight sutures.   The skin was closed with a running 4-0 Vicryl subcuticular suture.  Benzoin and Steri-Strips were applied followed by a Telfa pad.  Fluff gauze and a compressive wrap applied.  The patient tolerated the procedure well and was taken to recovery room in stable condition.

## 2017-03-05 ENCOUNTER — Telehealth: Payer: Self-pay | Admitting: General Surgery

## 2017-03-05 LAB — SURGICAL PATHOLOGY

## 2017-03-05 NOTE — Telephone Encounter (Signed)
Message left that pathology report was fine.  Phone rang directly to voicemail.  Encouraged to call tomorrow for details, or we will review at her office follow-up.  Pathology showed the margins of less than 1 mm posteriorly.  This was on the pectoralis fascial level.  I see little benefit from re-excising this area for an intermediate grade DCIS.  Patient will be a candidate for postoperative radiation and hormone suppressive therapy.Marland Kitchen

## 2017-03-07 DIAGNOSIS — I10 Essential (primary) hypertension: Secondary | ICD-10-CM | POA: Diagnosis not present

## 2017-03-07 DIAGNOSIS — E785 Hyperlipidemia, unspecified: Secondary | ICD-10-CM | POA: Diagnosis not present

## 2017-03-07 DIAGNOSIS — Z8673 Personal history of transient ischemic attack (TIA), and cerebral infarction without residual deficits: Secondary | ICD-10-CM | POA: Diagnosis not present

## 2017-03-07 DIAGNOSIS — Z85528 Personal history of other malignant neoplasm of kidney: Secondary | ICD-10-CM | POA: Diagnosis not present

## 2017-03-08 ENCOUNTER — Telehealth: Payer: Self-pay | Admitting: *Deleted

## 2017-03-08 NOTE — Telephone Encounter (Signed)
Patient called stated that she had surgery on 03/04/17 and she wants to know when she can take her bandages off. She had a breast lumpectomy.

## 2017-03-08 NOTE — Telephone Encounter (Signed)
Left message on patient's voicemail. She may remove her wrap and fluff dressing. She can leave the waterproof dressing in place if she likes or remove it too, but to leave the steri strips in place. She may shower and just to not scrub the area and pat dry.

## 2017-03-12 ENCOUNTER — Ambulatory Visit: Payer: Self-pay | Admitting: Oncology

## 2017-03-12 ENCOUNTER — Other Ambulatory Visit: Payer: Self-pay | Admitting: *Deleted

## 2017-03-13 ENCOUNTER — Other Ambulatory Visit: Payer: Self-pay

## 2017-03-14 ENCOUNTER — Ambulatory Visit
Admission: RE | Admit: 2017-03-14 | Discharge: 2017-03-14 | Disposition: A | Discharge status: Home / Self Care | Payer: 59 | Source: Ambulatory Visit | Attending: Radiation Oncology | Admitting: Radiation Oncology

## 2017-03-14 ENCOUNTER — Encounter: Payer: Self-pay | Admitting: Oncology

## 2017-03-14 ENCOUNTER — Inpatient Hospital Stay: Payer: Self-pay

## 2017-03-14 ENCOUNTER — Inpatient Hospital Stay: Payer: 59 | Attending: Oncology | Admitting: Oncology

## 2017-03-14 ENCOUNTER — Ambulatory Visit (INDEPENDENT_AMBULATORY_CARE_PROVIDER_SITE_OTHER): Payer: 59 | Admitting: General Surgery

## 2017-03-14 ENCOUNTER — Encounter: Payer: Self-pay | Admitting: General Surgery

## 2017-03-14 VITALS — BP 142/78 | HR 75 | Resp 11 | Ht 64.0 in | Wt 161.0 lb

## 2017-03-14 VITALS — BP 147/90 | HR 61 | Temp 97.3°F | Resp 18 | Ht 64.0 in | Wt 161.4 lb

## 2017-03-14 DIAGNOSIS — Z170 Estrogen receptor positive status [ER+]: Secondary | ICD-10-CM | POA: Insufficient documentation

## 2017-03-14 DIAGNOSIS — C50111 Malignant neoplasm of central portion of right female breast: Secondary | ICD-10-CM

## 2017-03-14 DIAGNOSIS — D0511 Intraductal carcinoma in situ of right breast: Secondary | ICD-10-CM | POA: Insufficient documentation

## 2017-03-14 NOTE — Progress Notes (Signed)
Pt  Sore from surgery 2/25 to breast

## 2017-03-14 NOTE — Consult Note (Signed)
NEW PATIENT EVALUATION  Name: Makayla Rice  MRN: [id number]  Date:   03/14/2017     DOB: 1950-07-05   This 67 y.o. 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  REFERRING PHYSICIAN: Adin Hector, MD  CHIEF COMPLAINT: No chief complaint on file.   DIAGNOSIS: There were no encounter diagnoses.   PREVIOUS INVESTIGATIONS:  Pathology reports reviewed Mammograms and ultrasound reviewed Clinical notes reviewed  HPI: Patient is a 67 year old female did not perform self-examination presented with an abnormal mammogram of her right breast showing heterogeneous calcifications in the upper central slightly inner right breast for which ductal carcinoma cannot be excluded. They spanned approximately 1.5 cm. This prompt ultrasound-guided biopsy which was positive for ER/PR positive ductal carcinoma in situ. She went on to have a wide local excision of the right breast showing 2 cm of grade 2 ductal carcinoma in situ with focal necrosis present. Margins were uninvolved but were less than 1 mm on the posterior margin which according to surgeon was up against the chest wall. She's been seen by medical oncology and plans for adjuvant aromatase inhibitor. She seen today for radiation oncology consultation she is doing well she specifically denies breast tenderness cough or bone pain.  PLANNED TREATMENT REGIMEN: Right whole breast radiation  PAST MEDICAL HISTORY:  has a past medical history of Arthritis, Breast cancer ([place]), Cancer ([place]) (4098), Complication of anesthesia, History of kidney stones, Hyperlipidemia, Hypertension, PONV (postoperative nausea and vomiting), and Stroke ([place]) (2009).    PAST SURGICAL HISTORY:  Past Surgical History:  Procedure Laterality Date  . ABDOMINAL HYSTERECTOMY    . BREAST BIOPSY Right 11/13/2016   Affirm Bx-path DCIS  . BREAST EXCISIONAL BIOPSY Right 03/04/2017    lumpectomy wit np DCIS   . BREAST LUMPECTOMY WITH NEEDLE LOCALIZATION Right 03/04/2017   Procedure: BREAST LUMPECTOMY WITH NEEDLE LOCALIZATION;  Surgeon: [name], MD;  Location: [facility] ORS;  Service: General;  Laterality: Right;  . COLONOSCOPY  2018  . EYE SURGERY  2003  . KIDNEY SURGERY  2013   ROBOTIC SURGERY  . PARTIAL NEPHRECTOMY  2013    FAMILY HISTORY: family history includes Cancer in her son; Ovarian cancer in her sister.  SOCIAL HISTORY:  reports that  has never smoked. she has never used smokeless tobacco. She reports that she drinks alcohol. She reports that she does not use drugs.  ALLERGIES: Sulfamethoxazole-trimethoprim; Ciprofloxacin; Doxycycline monohydrate; Sulfa antibiotics; Tetracycline; Tetracyclines & related; Bacitracin; and Metronidazole  MEDICATIONS:  Current Outpatient Medications  Medication Sig Dispense Refill  . albuterol (PROVENTIL HFA;VENTOLIN HFA) 108 (90 Base) MCG/ACT inhaler Inhale 2 puffs into the lungs every 6 (six) hours as needed for wheezing or shortness of breath. 1 Inhaler 0  . aspirin EC 81 MG tablet Take 81 mg daily by mouth.     Marland Kitchen azelastine (ASTELIN) 0.1 % nasal spray Place 1 spray into both nostrils daily as needed. For cold symptoms  5  . benzonatate (TESSALON) 200 MG capsule Take 200 mg by mouth 3 (three) times daily as needed for cough.  0  . Calcium Carbonate-Vit D-Min (CALCIUM 600+D3 PLUS MINERALS PO) Take 1 tablet by mouth daily.    . Cholecalciferol (VITAMIN D3) 2000 UNITS capsule Take 2,000 Units daily by mouth.     Marland Kitchen HYDROcodone-acetaminophen (NORCO/VICODIN) 5-325 MG tablet Take 1 tablet by mouth every 4 (four) hours as needed for moderate pain. (Patient not  taking: Reported on 03/14/2017) 20 tablet 0  . hydrocortisone 2.5 % ointment Apply 1 application topically 2 (two) times daily as needed. For affected areas of skin  2  . levobunolol (BETAGAN) 0.5 % ophthalmic solution Place 1 drop into both eyes daily.     Marland Kitchen loratadine  (CLARITIN) 10 MG tablet Take 10 mg by mouth daily as needed for allergies.    Marland Kitchen lovastatin (MEVACOR) 40 MG tablet Take 80 mg by mouth at bedtime.     . metoprolol tartrate (LOPRESSOR) 25 MG tablet Take 25 mg by mouth every morning. TAKE ONE TABLET ONCE DAILY FOR BLOOD PRESSURE    . Multiple Vitamin (MULTIVITAMIN WITH MINERALS) TABS tablet Take 1 tablet by mouth daily. Centrum Silver    . prednisoLONE acetate (PRED FORTE) 1 % ophthalmic suspension Place 1 drop into the left eye daily.     No current facility-administered medications for this encounter.     ECOG PERFORMANCE STATUS:  0 - Asymptomatic  REVIEW OF SYSTEMS:  Patient denies any weight loss, fatigue, weakness, fever, chills or night sweats. Patient denies any loss of vision, blurred vision. Patient denies any ringing  of the ears or hearing loss. No irregular heartbeat. Patient denies heart murmur or history of fainting. Patient denies any chest pain or pain radiating to her upper extremities. Patient denies any shortness of breath, difficulty breathing at night, cough or hemoptysis. Patient denies any swelling in the lower legs. Patient denies any nausea vomiting, vomiting of blood, or coffee ground material in the vomitus. Patient denies any stomach pain. Patient states has had normal bowel movements no significant constipation or diarrhea. Patient denies any dysuria, hematuria or significant nocturia. Patient denies any problems walking, swelling in the joints or loss of balance. Patient denies any skin changes, loss of hair or loss of weight. Patient denies any excessive worrying or anxiety or significant depression. Patient denies any problems with insomnia. Patient denies excessive thirst, polyuria, polydipsia. Patient denies any swollen glands, patient denies easy bruising or easy bleeding. Patient denies any recent infections, allergies or URI. Patient "s visual fields have not changed significantly in recent time.    PHYSICAL  EXAM: There were no vitals taken for this visit. Patient is status post wide local excision of the right breast which is healing well. No dominant mass or nodularity is noted in either breast in 2 positions examined. No axillary or supraclavicular adenopathy is appreciated. Well-developed well-nourished patient in NAD. HEENT reveals PERLA, EOMI, discs not visualized.  Oral cavity is clear. No oral mucosal lesions are identified. Neck is clear without evidence of cervical or supraclavicular adenopathy. Lungs are clear to A&P. Cardiac examination is essentially unremarkable with regular rate and rhythm without murmur rub or thrill. Abdomen is benign with no organomegaly or masses noted. Motor sensory and DTR levels are equal and symmetric in the upper and lower extremities. Cranial nerves II through XII are grossly intact. Proprioception is intact. No peripheral adenopathy or edema is identified. No motor or sensory levels are noted. Crude visual fields are within normal range.  LABORATORY DATA: Pathology reports reviewed    RADIOLOGY RESULTS: Mammogram and ultrasound reviewed. Follow-up ultrasound after lumpectomy also reviewed showing a seroma cavity close to the chest wall.   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 I would favor whole breast radiation the hypofractionated regiment over 4 weeks. Would also boost her posterior scar 1600 cGy using electron beam based on the close margin. Risks and benefits of treatment including skin reaction fatigue alteration of blood counts possible inclusion of superficial lung all were discussed in detail with the patient. She seems to comprehend my treatment plan well. Patient also be candidate for aromatase  inhibitor therapy after completion of radiation. I've personally set up and ordered CT simulation for next week.  I would like to take this opportunity to thank you for allowing me to participate in the care of your patient.Noreene Filbert, MD

## 2017-03-14 NOTE — Progress Notes (Signed)
Hematology/Oncology Consult note [facility]  [phone] [phone]  Patient Care Team: Adin Hector, MD as PCP - General (Internal Medicine)   Name of the patient: Makayla Rice  [id number]  [birth year]   Date of visit: 03/14/17  Diagnosis- right breast DCIS  Chief complaint/ Reason for visit- routine f/u of DCIS  Heme/Onc history: 1.  Patient is a 67 year old female who underwent a bilateral breast mammogram on 11/02/2016 which showed group of heterogeneous calcifications in the superior right breast for which DCIS cannot be excluded.  No evidence of malignancy in the left breast.  2.  Patient underwent biopsy of these calcifications which revealed DCIS micropapillary type with associated calcifications.  Tumor was greater than 90% ER positive and 11-50% PR positive.   3.  Patient has seen Dr. Bary Castilla.  She was deemed to be a candidate for, trial which is looking at standard of care treatment which would be surgery followed by adjuvant radiation and hormone therapy versus hormone therapy alone versus observation  4. She is G5P5L5. Used hormone contraception briefly in the remote past. Had hysterectomy for fibroids in 1990's. Family history significant for ovarian cancer in her sister in the 69's. Personal history of kidney cancer. No other h/o colon, pancreatic, stomach or breast cancer or melanoma  5. Patient underwent lumpectomy on with wide excision on 03/04/17. Pathology showed: DCIS atleast 2 cm, grade 2 with micropapillary features. Margins were close (posterior margin <1 mm). ER >90% positive, PR 11-50% positive. Per Dr. Dwyane Luo note- The posterior margin was pectoralis fascia and this tissue not removed in the specimen. Re excision therefore not planned   Interval history- she is doing well post surgery. Denies any pain or other symptoms  ECOG PS- 0 Pain scale- 0   Review of systems- Review of Systems    Constitutional: Negative for chills, fever, malaise/fatigue and weight loss.  HENT: Negative for congestion, ear discharge and nosebleeds.   Eyes: Negative for blurred vision.  Respiratory: Negative for cough, hemoptysis, sputum production, shortness of breath and wheezing.   Cardiovascular: Negative for chest pain, palpitations, orthopnea and claudication.  Gastrointestinal: Negative for abdominal pain, blood in stool, constipation, diarrhea, heartburn, melena, nausea and vomiting.  Genitourinary: Negative for dysuria, flank pain, frequency, hematuria and urgency.  Musculoskeletal: Negative for back pain, joint pain and myalgias.  Skin: Negative for rash.  Neurological: Negative for dizziness, tingling, focal weakness, seizures, weakness and headaches.  Endo/Heme/Allergies: Does not bruise/bleed easily.  Psychiatric/Behavioral: Negative for depression and suicidal ideas. The patient does not have insomnia.      Allergies  Allergen Reactions  . Sulfamethoxazole-Trimethoprim Rash  . Ciprofloxacin     Other reaction(s): Headache  . Doxycycline Monohydrate     Other reaction(s): Unknown  . Sulfa Antibiotics Other (See Comments)  . Tetracycline Other (See Comments)  . Tetracyclines & Related     Other reaction(s): Unknown  . Bacitracin Rash  . Metronidazole Rash     Past Medical History:  Diagnosis Date  . Arthritis   . Breast cancer ([place])    DCis  . Cancer [facility]) 2013   kidney cancer  . Complication of anesthesia   . History of kidney stones   . Hyperlipidemia   . Hypertension   . PONV (postoperative nausea and vomiting)    AFTER ROBOTIC KIDNEY SURGERY  . 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 ABDOMINAL HYSTERECTOMY    .  BREAST BIOPSY Right 11/13/2016   Affirm Bx-path DCIS  . BREAST EXCISIONAL BIOPSY Right 03/04/2017   lumpectomy wit np DCIS   . BREAST LUMPECTOMY WITH NEEDLE LOCALIZATION Right 03/04/2017    Procedure: BREAST LUMPECTOMY WITH NEEDLE LOCALIZATION;  Surgeon: [name], MD;  Location: [facility] ORS;  Service: General;  Laterality: Right;  . COLONOSCOPY  2018  . EYE SURGERY  2003  . KIDNEY SURGERY  2013   ROBOTIC SURGERY  . PARTIAL NEPHRECTOMY  2013    Social History   Socioeconomic History  . Marital status: Single    Spouse name: Not on file  . Number of children: 5  . Years of education: Not on file  . Highest education level: GED or equivalent  Social Needs  . Financial resource strain: Not hard at all  . Food insecurity - worry: Never true  . Food insecurity - inability: Never true  . Transportation needs - medical: No  . Transportation needs - non-medical: No  Occupational History  . Occupation: Child psychotherapist: [employer]  Tobacco Use  . Smoking status: Never Smoker  . Smokeless tobacco: Never Used  Substance and Sexual Activity  . Alcohol use: Yes    Alcohol/week: 0.0 oz    Comment: WINE RARELY  . Drug use: No  . Sexual activity: Yes  Other Topics Concern  . Not on file  Social History Narrative  . Not on file    Family History  Problem Relation Age of Onset  . Ovarian cancer Sister   . Cancer Son   . Breast cancer Neg Hx      Current Outpatient Medications:  .  albuterol (PROVENTIL HFA;VENTOLIN HFA) 108 (90 Base) MCG/ACT inhaler, Inhale 2 puffs into the lungs every 6 (six) hours as needed for wheezing or shortness of breath., Disp: 1 Inhaler, Rfl: 0 .  aspirin EC 81 MG tablet, Take 81 mg daily by mouth. , Disp: , Rfl:  .  azelastine (ASTELIN) 0.1 % nasal spray, Place 1 spray into both nostrils daily as needed. For cold symptoms, Disp: , Rfl: 5 .  benzonatate (TESSALON) 200 MG capsule, Take 200 mg by mouth 3 (three) times daily as needed for cough., Disp: , Rfl: 0 .  Calcium Carbonate-Vit D-Min (CALCIUM 600+D3 PLUS MINERALS PO), Take 1 tablet by mouth daily., Disp: , Rfl:  .  Cholecalciferol (VITAMIN D3) 2000 UNITS capsule, Take  2,000 Units daily by mouth. , Disp: , Rfl:  .  HYDROcodone-acetaminophen (NORCO/VICODIN) 5-325 MG tablet, Take 1 tablet by mouth every 4 (four) hours as needed for moderate pain., Disp: 20 tablet, Rfl: 0 .  hydrocortisone 2.5 % ointment, Apply 1 application topically 2 (two) times daily as needed. For affected areas of skin, Disp: , Rfl: 2 .  levobunolol (BETAGAN) 0.5 % ophthalmic solution, Place 1 drop into both eyes daily. , Disp: , Rfl:  .  loratadine (CLARITIN) 10 MG tablet, Take 10 mg by mouth daily as needed for allergies., Disp: , Rfl:  .  lovastatin (MEVACOR) 40 MG tablet, Take 80 mg by mouth at bedtime. , Disp: , Rfl:  .  metoprolol tartrate (LOPRESSOR) 25 MG tablet, Take 25 mg by mouth every morning. TAKE ONE TABLET ONCE DAILY FOR BLOOD PRESSURE, Disp: , Rfl:  .  Multiple Vitamin (MULTIVITAMIN WITH MINERALS) TABS tablet, Take 1 tablet by mouth daily. Centrum Silver, Disp: , Rfl:  .  prednisoLONE acetate (PRED FORTE) 1 % ophthalmic suspension, Place 1 drop into the left  eye daily., Disp: , Rfl:   Physical exam:  Vitals:   03/14/17 1104  BP: (!) 147/90  Pulse: 61  Resp: 18  Temp: (!) 97.3 F (36.3 C)  TempSrc: Tympanic  Weight: 161 lb 6.4 oz (73.2 kg)  Height: 5' 4" (1.626 m)   Physical Exam  Constitutional: She is oriented to person, place, and time and well-developed, well-nourished, and in no distress.  HENT:  Head: Normocephalic and atraumatic.  Eyes: EOM are normal. Pupils are equal, round, and reactive to light.  Neck: Normal range of motion.  Cardiovascular: Normal rate, regular rhythm and normal heart sounds.  Pulmonary/Chest: Effort normal and breath sounds normal.  Abdominal: Soft. Bowel sounds are normal.  Neurological: She is alert and oriented to person, place, and time.  Skin: Skin is warm and dry.   Breast exam was performed in seated and lying down position. Patient is status post right lumpectomy with a well-healed surgical scar.   CMP Latest Ref Rng &  Units 10/25/2016  Creatinine 0.44 - 1.00 mg/dL 0.90   No flowsheet data found.  No images are attached to the encounter.  Mm Breast Surgical Specimen  Result Date: 03/04/2017 CLINICAL DATA:  Right breast lumpectomy for biopsy-proven DCIS. Needle/wire localization was performed earlier today. EXAM: SPECIMEN RADIOGRAPH OF THE RIGHT BREAST COMPARISON:  Previous exam(s). FINDINGS: Status post excision of the right breast. The intact localization wire, the ribbon shaped tissue marker clip and the calcifications associated with the DCIS are present in the specimen. Results were discussed directly with Dr. Bary Castilla at the time of interpretation on 03/04/2017 at 11:45 a.m. IMPRESSION: Specimen radiograph of the right breast. Electronically Signed   By: Evangeline Dakin M.D.   On: 03/04/2017 11:47   Mm Right Plc Breast Loc Dev   1st Lesion  Inc Mammo Guide  Result Date: 03/04/2017 CLINICAL DATA:  Biopsy-proven DCIS involving the upper inner quadrant of the right breast. Needle/wire localization prior to lumpectomy. EXAM: NEEDLE LOCALIZATION OF THE RIGHT BREAST WITH MAMMO GUIDANCE COMPARISON:  Previous exams. FINDINGS: Patient presents for needle localization prior to right breast lumpectomy. I met with the patient and we discussed the procedure of needle localization including benefits and alternatives. We discussed the high likelihood of a successful procedure. We discussed the risks of the procedure, including infection, bleeding, tissue injury, and further surgery. Informed, written consent was given. The usual time-out protocol was performed immediately prior to the procedure. Using mammographic guidance, sterile technique with chlorhexidine as skin antisepsis, 1% lidocaine as local anesthetic, a 7 cm modified Kopans needle was used to localize the calcifications and the associated ribbon shaped tissue marker clip using a medial approach. The images were marked for Dr. Bary Castilla. IMPRESSION: Needle localization  of biopsy-proven DCIS involving the upper inner quadrant of the right breast. No apparent complications. Electronically Signed   By: Evangeline Dakin M.D.   On: 03/04/2017 08:27     Assessment and plan- Patient is a 67 y.o. female with right breast DCIS ER + s/p lumpectomy with close margins  I have reviewed final DCIS pathology in detail and discussed findings with the patient. Posterior close margin cannot be re exvised as it involves pectoralis fascia. I will refer her to Radiation Oncology for radiation posst lumpectomy for DCIS. She may need radiation boost for posterior margin  Given that her DCIS was ER PR positive hormone therapy is indicated at this time for 5 years.  I discussed the role for hormone therapy. Given that she is  postmenopausal I would favor 5 years of adjuvant hormone therapy with aromatase inhibitor. I discussed the risks and benefits of Arimidex including all but not limited to fatigue, hypercholesterolemia, hot flashes, arthralgias and worsening bone health.  Patient will also need to be on calcium 1200 mg along with vitamin D 800 international units.  We will obtain a baseline bone density scan written information about Arimidex given to the patient. I would like her to finish radiation therapy and start hormone therapy thereafter. Patient verbalized understanding and agrees to proceed  We will prescribe arimidex early may 2019. I will see her 3 months from now to see how she is tolerating her arimidex and discuss results of bone density scan as well. Check cmp at that time. Recent cbc/ cmp from 02/28/17 was normal  Cancer Staging Ductal carcinoma in situ (DCIS) of right breast Staging form: Breast, AJCC 8th Edition - Clinical stage from 03/14/2017: Stage Unknown (cTis (DCIS), cNX, cM0, G2, ER: Positive, PR: Positive, HER2: Not assessed ) - Signed by Sindy Guadeloupe, MD on 03/14/2017      Visit Diagnosis 1. Ductal carcinoma in situ (DCIS) of right breast      Dr.  Randa Evens, MD, MPH [facility] Pager- [phone] 03/14/2017 11:59 AM

## 2017-03-14 NOTE — Patient Instructions (Addendum)
The patient is aware to call back for any questions or new concerns.  

## 2017-03-14 NOTE — Progress Notes (Signed)
Patient ID: Makayla Rice, female   DOB: 1950-07-23, 67 y.o.   MRN: [id number]  Chief Complaint  Patient presents with  . Routine Post Op    HPI Makayla Rice is a 67 y.o. female here today for her post op right breast lumpectomy done on 03/04/2017. She states she is still tender especially when she takes her bra off.  HPI  Past Medical History:  Diagnosis Date  . Arthritis   . Breast cancer ([place])    DCis  . Cancer [facility]) 2013   kidney cancer  . Complication of anesthesia   . History of kidney stones   . Hyperlipidemia   . Hypertension   . PONV (postoperative nausea and vomiting)    AFTER ROBOTIC KIDNEY SURGERY  . 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 ABDOMINAL HYSTERECTOMY    . BREAST BIOPSY Right 11/13/2016   Affirm Bx-path DCIS  . BREAST EXCISIONAL BIOPSY Right 03/04/2017   lumpectomy wit np DCIS   . BREAST LUMPECTOMY WITH NEEDLE LOCALIZATION Right 03/04/2017   Procedure: BREAST LUMPECTOMY WITH NEEDLE LOCALIZATION;  Surgeon: [name], MD;  Location: [facility] ORS;  Service: General;  Laterality: Right;  . COLONOSCOPY  2018  . EYE SURGERY  2003  . KIDNEY SURGERY  2013   ROBOTIC SURGERY  . PARTIAL NEPHRECTOMY  2013    Family History  Problem Relation Age of Onset  . Ovarian cancer Sister   . Cancer Son   . Breast cancer Neg Hx     Social History Social History   Tobacco Use  . Smoking status: Never Smoker  . Smokeless tobacco: Never Used  Substance Use Topics  . Alcohol use: Yes    Alcohol/week: 0.0 oz    Comment: WINE RARELY  . Drug use: No    Allergies  Allergen Reactions  . Sulfamethoxazole-Trimethoprim Rash  . Ciprofloxacin     Other reaction(s): Headache  . Doxycycline Monohydrate     Other reaction(s): Unknown  . Sulfa Antibiotics Other (See Comments)  . Tetracycline Other (See Comments)  . Tetracyclines & Related     Other reaction(s): Unknown  . Bacitracin Rash  . Metronidazole  Rash    Current Outpatient Medications  Medication Sig Dispense Refill  . albuterol (PROVENTIL HFA;VENTOLIN HFA) 108 (90 Base) MCG/ACT inhaler Inhale 2 puffs into the lungs every 6 (six) hours as needed for wheezing or shortness of breath. 1 Inhaler 0  . aspirin EC 81 MG tablet Take 81 mg daily by mouth.     Marland Kitchen azelastine (ASTELIN) 0.1 % nasal spray Place 1 spray into both nostrils daily as needed. For cold symptoms  5  . benzonatate (TESSALON) 200 MG capsule Take 200 mg by mouth 3 (three) times daily as needed for cough.  0  . Calcium Carbonate-Vit D-Min (CALCIUM 600+D3 PLUS MINERALS PO) Take 1 tablet by mouth daily.    . Cholecalciferol (VITAMIN D3) 2000 UNITS capsule Take 2,000 Units daily by mouth.     . hydrocortisone 2.5 % ointment Apply 1 application topically 2 (two) times daily as needed. For affected areas of skin  2  . levobunolol (BETAGAN) 0.5 % ophthalmic solution Place 1 drop into both eyes daily.     Marland Kitchen loratadine (CLARITIN) 10 MG tablet Take 10 mg by mouth daily as needed for allergies.    Marland Kitchen lovastatin (MEVACOR) 40 MG tablet Take 80 mg by mouth at bedtime.     Marland Kitchen  metoprolol tartrate (LOPRESSOR) 25 MG tablet Take 25 mg by mouth every morning. TAKE ONE TABLET ONCE DAILY FOR BLOOD PRESSURE    . Multiple Vitamin (MULTIVITAMIN WITH MINERALS) TABS tablet Take 1 tablet by mouth daily. Centrum Silver    . prednisoLONE acetate (PRED FORTE) 1 % ophthalmic suspension Place 1 drop into the left eye daily.    Marland Kitchen HYDROcodone-acetaminophen (NORCO/VICODIN) 5-325 MG tablet Take 1 tablet by mouth every 4 (four) hours as needed for moderate pain. (Patient not taking: Reported on 03/14/2017) 20 tablet 0   No current facility-administered medications for this visit.     Review of Systems Review of Systems  Constitutional: Negative.   Respiratory: Negative.   Cardiovascular: Negative.     Blood pressure (!) 142/78, pulse 75, resp. rate 11, height 5\' 4"  (1.626 m), weight 161 lb (73 kg), SpO2 98  %.  Physical Exam Physical Exam  Constitutional: She is oriented to person, place, and time. She appears well-developed and well-nourished.  HENT:  Mouth/Throat: Oropharynx is clear and moist.  Eyes: Conjunctivae are normal. No scleral icterus.  Neck: Neck supple.  Pulmonary/Chest: Right breast exhibits no inverted nipple, no mass, no nipple discharge, no skin change and no tenderness.  Incision clean right breast  Neurological: She is alert and oriented to person, place, and time.  Skin: Skin is warm and dry.  Psychiatric: Her behavior is normal.    Data Reviewed DIAGNOSIS:  A. RIGHT BREAST, 12:00; NEEDLE LOCALIZED WIDE EXCISION:  - DUCTAL CARCINOMA IN SITU WITH MICROPAPILLARY FEATURES, NUCLEAR GRADE  II.  - THE SURGICAL MARGINS ARE CLOSE BUT NEGATIVE, CLOSEST MARGIN IS  POSTERIOR (LESS THAN 1 MM).  - BACKGROUND FIBROCYSTIC CHANGE.  - SMALL INTRADUCTAL PAPILLOMA (3 MM).  - FIBROADENOMATOUS CHANGE.  - BIOPSY SITE CHANGES, MARKER CLIP PRESENT.  - SEE SUMMARY BELOW.    Surgical Pathology Cancer Case Summary   DUCTAL CARCINOMA IN SITU OF THE BREAST:  Procedure: Needle localized wide excision  Specimen Laterality: Right  Size (Extent) of DCIS: at least 20 mm  Histologic Type: Ductal carcinoma in situ (DCIS)  Nuclear Grade: Intermediate (II)  Necrosis: Present, focal  Margins: Uninvolved, distance from closest margin <1 mm (posterior)  Regional Lymph nodes: Lymph nodes not submitted or found  Pathologic Stage Classification (pTNM, AJCC 8th Edition): pTis pNx  TNM Descriptors: Not applicable   Biomarkers were performed on a separate specimen and in summary:  Estrogen Receptor (ER) Status: Positive, greater than 90% nuclear  staining    Average intensity of staining: Strong  Progesterone Receptor (PgR) Status: Positive, 11-50% range of nuclear  staining    Average intensity of staining: Moderate   The posterior margin was the pectoralis fascia, although this  tissue was not removed with the specimen. The likelihood of benefit from reexcision even though the margin is less than 2 mm.  Ultrasound examination of the right breast was undertaken to determine if the patient would be a candidate for accelerated partial breast radiation.  There is a small seroma cavity adjacent to the pectoralis muscle.  This is likely to close to the chest wall to allow sufficient spacing for MammoSite balloon placement.  This information was relayed by phone to the radiation oncology service.  Assessment    Doing well post excision DCIS.  Technically positive posterior margin is the pectoralis fascia, no anticipated benefit from reexcision.    Plan  The patient has had an appointment scheduled with medical oncology for later this morning.  She can decide  after that visit if she wants to proceed seeing 3 groups of physicians or only 2.  Her choice.  We will plan for a follow-up examination in about 3 weeks which would likely be just before she begins accelerated partial breast radiation. May return to work 03-25-17 Discussed antihormone therapy Keep appointment with Dr Janese Banks today Appointment with Dr Donella Stade for breast radiation today as well   HPI, Physical Exam, Assessment and Plan have been scribed under the direction and in the presence of [name], MD. Karie Fetch, RN  I have completed the exam and reviewed the above documentation for accuracy and completeness.  I agree with the above.  Haematologist has been used and any errors in dictation or transcription are unintentional.  Hervey Ard, M.D., F.A.C.S.  Forest Gleason Delores Edelstein 03/16/2017, 9:29 AM

## 2017-03-18 ENCOUNTER — Telehealth: Payer: Self-pay

## 2017-03-18 NOTE — Telephone Encounter (Signed)
-----   Message from [name], MD sent at 03/16/2017  9:35 AM EST ----- Please arrange a follow-up appointment in about 3 weeks.  Thank you

## 2017-03-18 NOTE — Telephone Encounter (Signed)
Message left for the patient to call back to schedule follow up in 3 weeks for post op breast lumpectomy.

## 2017-03-19 ENCOUNTER — Ambulatory Visit
Admission: RE | Admit: 2017-03-19 | Discharge: 2017-03-19 | Disposition: A | Discharge status: Home / Self Care | Payer: 59 | Source: Ambulatory Visit | Attending: Radiation Oncology | Admitting: Radiation Oncology

## 2017-03-19 DIAGNOSIS — Z510 Encounter for antineoplastic radiation therapy: Secondary | ICD-10-CM | POA: Diagnosis not present

## 2017-03-19 DIAGNOSIS — D0511 Intraductal carcinoma in situ of right breast: Secondary | ICD-10-CM | POA: Insufficient documentation

## 2017-03-19 DIAGNOSIS — Z170 Estrogen receptor positive status [ER+]: Secondary | ICD-10-CM | POA: Diagnosis not present

## 2017-03-21 ENCOUNTER — Ambulatory Visit: Payer: 59 | Admitting: General Surgery

## 2017-03-26 ENCOUNTER — Telehealth: Payer: Self-pay

## 2017-03-26 NOTE — Telephone Encounter (Signed)
The patient called and had some questions for Dr Bary Castilla about her treatment for breast cancer. She is asking that he call her back.

## 2017-03-27 DIAGNOSIS — Z510 Encounter for antineoplastic radiation therapy: Secondary | ICD-10-CM | POA: Diagnosis not present

## 2017-03-27 DIAGNOSIS — Z170 Estrogen receptor positive status [ER+]: Secondary | ICD-10-CM | POA: Diagnosis not present

## 2017-03-27 DIAGNOSIS — D0511 Intraductal carcinoma in situ of right breast: Secondary | ICD-10-CM | POA: Diagnosis not present

## 2017-04-01 ENCOUNTER — Ambulatory Visit
Admission: RE | Admit: 2017-04-01 | Discharge: 2017-04-01 | Disposition: A | Discharge status: Home / Self Care | Payer: 59 | Source: Ambulatory Visit | Attending: Radiation Oncology | Admitting: Radiation Oncology

## 2017-04-01 DIAGNOSIS — D0511 Intraductal carcinoma in situ of right breast: Secondary | ICD-10-CM | POA: Diagnosis not present

## 2017-04-01 DIAGNOSIS — Z170 Estrogen receptor positive status [ER+]: Secondary | ICD-10-CM | POA: Diagnosis not present

## 2017-04-01 DIAGNOSIS — Z510 Encounter for antineoplastic radiation therapy: Secondary | ICD-10-CM | POA: Diagnosis not present

## 2017-04-02 ENCOUNTER — Ambulatory Visit
Admission: RE | Admit: 2017-04-02 | Discharge: 2017-04-02 | Disposition: A | Discharge status: Home / Self Care | Payer: 59 | Source: Ambulatory Visit | Attending: Radiation Oncology | Admitting: Radiation Oncology

## 2017-04-02 ENCOUNTER — Other Ambulatory Visit: Payer: Self-pay | Admitting: *Deleted

## 2017-04-02 DIAGNOSIS — Z170 Estrogen receptor positive status [ER+]: Principal | ICD-10-CM

## 2017-04-02 DIAGNOSIS — Z510 Encounter for antineoplastic radiation therapy: Secondary | ICD-10-CM | POA: Diagnosis not present

## 2017-04-02 DIAGNOSIS — D0511 Intraductal carcinoma in situ of right breast: Secondary | ICD-10-CM | POA: Diagnosis not present

## 2017-04-02 DIAGNOSIS — C50111 Malignant neoplasm of central portion of right female breast: Secondary | ICD-10-CM

## 2017-04-03 ENCOUNTER — Telehealth: Payer: Self-pay | Admitting: *Deleted

## 2017-04-03 ENCOUNTER — Telehealth: Payer: Self-pay | Admitting: General Surgery

## 2017-04-03 ENCOUNTER — Ambulatory Visit
Admission: RE | Admit: 2017-04-03 | Discharge: 2017-04-03 | Disposition: A | Discharge status: Home / Self Care | Payer: 59 | Source: Ambulatory Visit | Attending: Radiation Oncology | Admitting: Radiation Oncology

## 2017-04-03 DIAGNOSIS — Z170 Estrogen receptor positive status [ER+]: Secondary | ICD-10-CM | POA: Diagnosis not present

## 2017-04-03 DIAGNOSIS — D0511 Intraductal carcinoma in situ of right breast: Secondary | ICD-10-CM | POA: Diagnosis not present

## 2017-04-03 DIAGNOSIS — Z510 Encounter for antineoplastic radiation therapy: Secondary | ICD-10-CM | POA: Diagnosis not present

## 2017-04-03 NOTE — Telephone Encounter (Signed)
Patient called with her daughter on the phone, Makayla Rice, she just wanted to know the specifics regarding radiation. [name] states she was very nervous when she went for her first radiation treatment. She just was feeling overwhelmed. I discussed in detail whole breast radiation, standard of treatment for DCIS, and future hormone therapy pill. Appreciates call and taking the time to explain what her treatment process would look like.

## 2017-04-03 NOTE — Telephone Encounter (Signed)
Cleared up few remaining questions about radiation. She thought she would be taking a "chemo" pill after radiation. Clarified this is a medication to suppress her hormone levels. F/U as scheduled.

## 2017-04-04 ENCOUNTER — Ambulatory Visit
Admission: RE | Admit: 2017-04-04 | Discharge: 2017-04-04 | Disposition: A | Discharge status: Home / Self Care | Payer: 59 | Source: Ambulatory Visit | Attending: Radiation Oncology | Admitting: Radiation Oncology

## 2017-04-04 DIAGNOSIS — D0511 Intraductal carcinoma in situ of right breast: Secondary | ICD-10-CM | POA: Diagnosis not present

## 2017-04-04 DIAGNOSIS — Z510 Encounter for antineoplastic radiation therapy: Secondary | ICD-10-CM | POA: Diagnosis not present

## 2017-04-04 DIAGNOSIS — Z170 Estrogen receptor positive status [ER+]: Secondary | ICD-10-CM | POA: Diagnosis not present

## 2017-04-05 ENCOUNTER — Ambulatory Visit
Admission: RE | Admit: 2017-04-05 | Discharge: 2017-04-05 | Disposition: A | Discharge status: Home / Self Care | Payer: 59 | Source: Ambulatory Visit | Attending: Radiation Oncology | Admitting: Radiation Oncology

## 2017-04-05 DIAGNOSIS — D0511 Intraductal carcinoma in situ of right breast: Secondary | ICD-10-CM | POA: Diagnosis not present

## 2017-04-05 DIAGNOSIS — Z510 Encounter for antineoplastic radiation therapy: Secondary | ICD-10-CM | POA: Diagnosis not present

## 2017-04-05 DIAGNOSIS — Z170 Estrogen receptor positive status [ER+]: Secondary | ICD-10-CM | POA: Diagnosis not present

## 2017-04-08 ENCOUNTER — Ambulatory Visit
Admission: RE | Admit: 2017-04-08 | Discharge: 2017-04-08 | Disposition: A | Discharge status: Home / Self Care | Payer: 59 | Source: Ambulatory Visit | Attending: Radiation Oncology | Admitting: Radiation Oncology

## 2017-04-08 DIAGNOSIS — D0511 Intraductal carcinoma in situ of right breast: Secondary | ICD-10-CM | POA: Insufficient documentation

## 2017-04-08 DIAGNOSIS — Z170 Estrogen receptor positive status [ER+]: Secondary | ICD-10-CM | POA: Diagnosis not present

## 2017-04-08 DIAGNOSIS — Z510 Encounter for antineoplastic radiation therapy: Secondary | ICD-10-CM | POA: Insufficient documentation

## 2017-04-09 ENCOUNTER — Ambulatory Visit
Admission: RE | Admit: 2017-04-09 | Discharge: 2017-04-09 | Disposition: A | Discharge status: Home / Self Care | Payer: 59 | Source: Ambulatory Visit | Attending: Radiation Oncology | Admitting: Radiation Oncology

## 2017-04-09 ENCOUNTER — Ambulatory Visit (INDEPENDENT_AMBULATORY_CARE_PROVIDER_SITE_OTHER): Payer: 59 | Admitting: General Surgery

## 2017-04-09 ENCOUNTER — Inpatient Hospital Stay: Payer: 59 | Attending: Radiation Oncology

## 2017-04-09 ENCOUNTER — Encounter (HOSPITAL_COMMUNITY): Payer: Self-pay

## 2017-04-09 ENCOUNTER — Encounter: Payer: Self-pay | Admitting: General Surgery

## 2017-04-09 VITALS — BP 142/80 | HR 99 | Resp 14 | Ht 64.0 in | Wt 161.0 lb

## 2017-04-09 DIAGNOSIS — D0511 Intraductal carcinoma in situ of right breast: Secondary | ICD-10-CM | POA: Diagnosis not present

## 2017-04-09 DIAGNOSIS — Z510 Encounter for antineoplastic radiation therapy: Secondary | ICD-10-CM | POA: Diagnosis not present

## 2017-04-09 DIAGNOSIS — C50111 Malignant neoplasm of central portion of right female breast: Secondary | ICD-10-CM

## 2017-04-09 DIAGNOSIS — Z170 Estrogen receptor positive status [ER+]: Secondary | ICD-10-CM

## 2017-04-09 LAB — CBC
HCT: 41.6 % (ref 35.0–47.0)
HEMOGLOBIN: 14 g/dL (ref 12.0–16.0)
MCH: 32.1 pg (ref 26.0–34.0)
MCHC: 33.7 g/dL (ref 32.0–36.0)
MCV: 95.1 fL (ref 80.0–100.0)
Platelets: 295 10*3/uL (ref 150–440)
RBC: 4.38 MIL/uL (ref 3.80–5.20)
RDW: 14.1 % (ref 11.5–14.5)
WBC: 9 10*3/uL (ref 3.6–11.0)

## 2017-04-09 NOTE — Patient Instructions (Addendum)
The patient is aware to call back for any questions or new concerns.  Patient to have a bilateral diagnostic mammogram and follow up in 8 months.

## 2017-04-09 NOTE — Progress Notes (Signed)
Patient ID: Makayla Rice, female   DOB: 29-Jan-1950, 67 y.o.   MRN: [id number]  Chief Complaint  Patient presents with  . Follow-up    HPI Makayla Rice is a 67 y.o. female.  here today for her post op right breast lumpectomy done on 03/04/2017. She states she has mild tenderness, occasionally. She states radiation of going well.  HPI  Past Medical History:  Diagnosis Date  . Arthritis   . 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 Cancer [facility]) 2013   kidney cancer  . Complication of anesthesia   . History of kidney stones   . Hyperlipidemia   . Hypertension   . PONV (postoperative nausea and vomiting)    AFTER ROBOTIC KIDNEY SURGERY  . 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 ABDOMINAL HYSTERECTOMY    . BREAST BIOPSY Right 11/13/2016   Affirm Bx-path DCIS  . BREAST EXCISIONAL BIOPSY Right 03/04/2017   lumpectomy wit np DCIS   . BREAST LUMPECTOMY WITH NEEDLE LOCALIZATION Right 03/04/2017   Procedure: BREAST LUMPECTOMY WITH NEEDLE LOCALIZATION;  Surgeon: [name], MD;  Location: [facility] ORS;  Service: General;  Laterality: Right;  . COLONOSCOPY  2018  . EYE SURGERY  2003  . KIDNEY SURGERY  2013   ROBOTIC SURGERY  . PARTIAL NEPHRECTOMY  2013    Family History  Problem Relation Age of Onset  . Ovarian cancer Sister   . Cancer Son   . Breast cancer Neg Hx     Social History Social History   Tobacco Use  . Smoking status: Never Smoker  . Smokeless tobacco: Never Used  Substance Use Topics  . Alcohol use: Yes    Alcohol/week: 0.0 oz    Comment: WINE RARELY  . Drug use: No    Allergies  Allergen Reactions  . Sulfamethoxazole-Trimethoprim Rash  . Ciprofloxacin     Other reaction(s): Headache  . Doxycycline Monohydrate     Other reaction(s): Unknown  . Sulfa Antibiotics Other (See Comments)  . Tetracycline Other (See Comments)  . Tetracyclines & Related     Other reaction(s): Unknown  . Bacitracin Rash  .  Metronidazole Rash    Current Outpatient Medications  Medication Sig Dispense Refill  . albuterol (PROVENTIL HFA;VENTOLIN HFA) 108 (90 Base) MCG/ACT inhaler Inhale 2 puffs into the lungs every 6 (six) hours as needed for wheezing or shortness of breath. 1 Inhaler 0  . aspirin EC 81 MG tablet Take 81 mg daily by mouth.     Marland Kitchen azelastine (ASTELIN) 0.1 % nasal spray Place 1 spray into both nostrils daily as needed. For cold symptoms  5  . benzonatate (TESSALON) 200 MG capsule Take 200 mg by mouth 3 (three) times daily as needed for cough.  0  . Calcium Carbonate-Vit D-Min (CALCIUM 600+D3 PLUS MINERALS PO) Take 1 tablet by mouth daily.    . Cholecalciferol (VITAMIN D3) 2000 UNITS capsule Take 2,000 Units daily by mouth.     . hydrocortisone 2.5 % ointment Apply 1 application topically 2 (two) times daily as needed. For affected areas of skin  2  . levobunolol (BETAGAN) 0.5 % ophthalmic solution Place 1 drop into both eyes daily.     Marland Kitchen loratadine (CLARITIN) 10 MG tablet Take 10 mg by mouth daily as needed for allergies.    Marland Kitchen lovastatin (MEVACOR) 40 MG tablet Take 80 mg by mouth at bedtime.     . metoprolol  tartrate (LOPRESSOR) 25 MG tablet Take 25 mg by mouth every morning. TAKE ONE TABLET ONCE DAILY FOR BLOOD PRESSURE    . Multiple Vitamin (MULTIVITAMIN WITH MINERALS) TABS tablet Take 1 tablet by mouth daily. Centrum Silver    . prednisoLONE acetate (PRED FORTE) 1 % ophthalmic suspension Place 1 drop into the left eye daily.     No current facility-administered medications for this visit.     Review of Systems Review of Systems  Constitutional: Negative.   Respiratory: Positive for cough.   Cardiovascular: Negative.     Blood pressure (!) 142/80, pulse 99, resp. rate 14, height 5\' 4"  (1.626 m), weight 161 lb (73 kg).  Physical Exam Physical Exam  Constitutional: She is oriented to person, place, and time. She appears well-developed and well-nourished.  Pulmonary/Chest: Right breast  exhibits no inverted nipple, no mass, no nipple discharge, no skin change and no tenderness.    Right breast incision healing well.  Neurological: She is alert and oriented to person, place, and time.  Skin: Skin is warm and dry.  Psychiatric: Her behavior is normal.    Data Reviewed Immediate grade DCIS.  Assessment    Patient has been seen by medical oncology with plans to begin Aromasin post radiation therapy.    Plan    She does not plan on starting the Arimidex until after her [place] trip.  Her daughter is graduating from [employer] major school in the [employer]  Patient to have a bilateral diagnostic mammogram and follow up in 8 months.       HPI, Physical Exam, Assessment and Plan have been scribed under the direction and in the presence of [name], MD. Makayla Fetch, RN  I have completed the exam and reviewed the above documentation for accuracy and completeness.  I agree with the above.  Haematologist has been used and any errors in dictation or transcription are unintentional.  Hervey Ard, M.D., F.A.C.S.   Makayla Rice 04/09/2017, 10:10 AM

## 2017-04-09 NOTE — Progress Notes (Signed)
Social worker met with patient and provided counseling services.

## 2017-04-10 ENCOUNTER — Ambulatory Visit
Admission: RE | Admit: 2017-04-10 | Discharge: 2017-04-10 | Disposition: A | Discharge status: Home / Self Care | Payer: 59 | Source: Ambulatory Visit | Attending: Radiation Oncology | Admitting: Radiation Oncology

## 2017-04-10 DIAGNOSIS — Z170 Estrogen receptor positive status [ER+]: Secondary | ICD-10-CM | POA: Diagnosis not present

## 2017-04-10 DIAGNOSIS — Z510 Encounter for antineoplastic radiation therapy: Secondary | ICD-10-CM | POA: Diagnosis not present

## 2017-04-10 DIAGNOSIS — D0511 Intraductal carcinoma in situ of right breast: Secondary | ICD-10-CM | POA: Diagnosis not present

## 2017-04-11 ENCOUNTER — Ambulatory Visit
Admission: RE | Admit: 2017-04-11 | Discharge: 2017-04-11 | Disposition: A | Discharge status: Home / Self Care | Payer: 59 | Source: Ambulatory Visit | Attending: Radiation Oncology | Admitting: Radiation Oncology

## 2017-04-11 DIAGNOSIS — Z510 Encounter for antineoplastic radiation therapy: Secondary | ICD-10-CM | POA: Diagnosis not present

## 2017-04-11 DIAGNOSIS — D0511 Intraductal carcinoma in situ of right breast: Secondary | ICD-10-CM | POA: Diagnosis not present

## 2017-04-11 DIAGNOSIS — Z170 Estrogen receptor positive status [ER+]: Secondary | ICD-10-CM | POA: Diagnosis not present

## 2017-04-12 ENCOUNTER — Ambulatory Visit
Admission: RE | Admit: 2017-04-12 | Discharge: 2017-04-12 | Disposition: A | Discharge status: Home / Self Care | Payer: 59 | Source: Ambulatory Visit | Attending: Radiation Oncology | Admitting: Radiation Oncology

## 2017-04-12 DIAGNOSIS — Z170 Estrogen receptor positive status [ER+]: Secondary | ICD-10-CM | POA: Diagnosis not present

## 2017-04-12 DIAGNOSIS — Z510 Encounter for antineoplastic radiation therapy: Secondary | ICD-10-CM | POA: Diagnosis not present

## 2017-04-12 DIAGNOSIS — D0511 Intraductal carcinoma in situ of right breast: Secondary | ICD-10-CM | POA: Diagnosis not present

## 2017-04-15 ENCOUNTER — Ambulatory Visit
Admission: RE | Admit: 2017-04-15 | Discharge: 2017-04-15 | Disposition: A | Discharge status: Home / Self Care | Payer: 59 | Source: Ambulatory Visit | Attending: Radiation Oncology | Admitting: Radiation Oncology

## 2017-04-15 DIAGNOSIS — Z510 Encounter for antineoplastic radiation therapy: Secondary | ICD-10-CM | POA: Diagnosis not present

## 2017-04-15 DIAGNOSIS — D0511 Intraductal carcinoma in situ of right breast: Secondary | ICD-10-CM | POA: Diagnosis not present

## 2017-04-15 DIAGNOSIS — Z170 Estrogen receptor positive status [ER+]: Secondary | ICD-10-CM | POA: Diagnosis not present

## 2017-04-16 ENCOUNTER — Ambulatory Visit
Admission: RE | Admit: 2017-04-16 | Discharge: 2017-04-16 | Disposition: A | Discharge status: Home / Self Care | Payer: 59 | Source: Ambulatory Visit | Attending: Radiation Oncology | Admitting: Radiation Oncology

## 2017-04-16 DIAGNOSIS — R8290 Unspecified abnormal findings in urine: Secondary | ICD-10-CM | POA: Diagnosis not present

## 2017-04-16 DIAGNOSIS — Z510 Encounter for antineoplastic radiation therapy: Secondary | ICD-10-CM | POA: Diagnosis not present

## 2017-04-16 DIAGNOSIS — D0511 Intraductal carcinoma in situ of right breast: Secondary | ICD-10-CM | POA: Diagnosis not present

## 2017-04-16 DIAGNOSIS — Z170 Estrogen receptor positive status [ER+]: Secondary | ICD-10-CM | POA: Diagnosis not present

## 2017-04-17 ENCOUNTER — Ambulatory Visit
Admission: RE | Admit: 2017-04-17 | Discharge: 2017-04-17 | Disposition: A | Discharge status: Home / Self Care | Payer: 59 | Source: Ambulatory Visit | Attending: Radiation Oncology | Admitting: Radiation Oncology

## 2017-04-17 DIAGNOSIS — D0511 Intraductal carcinoma in situ of right breast: Secondary | ICD-10-CM | POA: Diagnosis not present

## 2017-04-17 DIAGNOSIS — Z510 Encounter for antineoplastic radiation therapy: Secondary | ICD-10-CM | POA: Diagnosis not present

## 2017-04-17 DIAGNOSIS — Z170 Estrogen receptor positive status [ER+]: Secondary | ICD-10-CM | POA: Diagnosis not present

## 2017-04-18 ENCOUNTER — Ambulatory Visit
Admission: RE | Admit: 2017-04-18 | Discharge: 2017-04-18 | Disposition: A | Discharge status: Home / Self Care | Payer: 59 | Source: Ambulatory Visit | Attending: Radiation Oncology | Admitting: Radiation Oncology

## 2017-04-18 DIAGNOSIS — D0511 Intraductal carcinoma in situ of right breast: Secondary | ICD-10-CM | POA: Diagnosis not present

## 2017-04-18 DIAGNOSIS — Z170 Estrogen receptor positive status [ER+]: Secondary | ICD-10-CM | POA: Diagnosis not present

## 2017-04-18 DIAGNOSIS — Z510 Encounter for antineoplastic radiation therapy: Secondary | ICD-10-CM | POA: Diagnosis not present

## 2017-04-19 ENCOUNTER — Ambulatory Visit
Admission: RE | Admit: 2017-04-19 | Discharge: 2017-04-19 | Disposition: A | Discharge status: Home / Self Care | Payer: 59 | Source: Ambulatory Visit | Attending: Radiation Oncology | Admitting: Radiation Oncology

## 2017-04-19 DIAGNOSIS — Z170 Estrogen receptor positive status [ER+]: Secondary | ICD-10-CM | POA: Diagnosis not present

## 2017-04-19 DIAGNOSIS — D0511 Intraductal carcinoma in situ of right breast: Secondary | ICD-10-CM | POA: Diagnosis not present

## 2017-04-19 DIAGNOSIS — Z510 Encounter for antineoplastic radiation therapy: Secondary | ICD-10-CM | POA: Diagnosis not present

## 2017-04-22 ENCOUNTER — Ambulatory Visit
Admission: RE | Admit: 2017-04-22 | Discharge: 2017-04-22 | Disposition: A | Discharge status: Home / Self Care | Payer: 59 | Source: Ambulatory Visit | Attending: Radiation Oncology | Admitting: Radiation Oncology

## 2017-04-22 DIAGNOSIS — Z170 Estrogen receptor positive status [ER+]: Secondary | ICD-10-CM | POA: Diagnosis not present

## 2017-04-22 DIAGNOSIS — D0511 Intraductal carcinoma in situ of right breast: Secondary | ICD-10-CM | POA: Diagnosis not present

## 2017-04-22 DIAGNOSIS — Z510 Encounter for antineoplastic radiation therapy: Secondary | ICD-10-CM | POA: Diagnosis not present

## 2017-04-23 ENCOUNTER — Ambulatory Visit
Admission: RE | Admit: 2017-04-23 | Discharge: 2017-04-23 | Disposition: A | Discharge status: Home / Self Care | Payer: 59 | Source: Ambulatory Visit | Attending: Radiation Oncology | Admitting: Radiation Oncology

## 2017-04-23 ENCOUNTER — Inpatient Hospital Stay: Payer: 59

## 2017-04-23 DIAGNOSIS — C50111 Malignant neoplasm of central portion of right female breast: Secondary | ICD-10-CM

## 2017-04-23 DIAGNOSIS — Z510 Encounter for antineoplastic radiation therapy: Secondary | ICD-10-CM | POA: Diagnosis not present

## 2017-04-23 DIAGNOSIS — D0511 Intraductal carcinoma in situ of right breast: Secondary | ICD-10-CM | POA: Diagnosis not present

## 2017-04-23 DIAGNOSIS — Z170 Estrogen receptor positive status [ER+]: Secondary | ICD-10-CM | POA: Diagnosis not present

## 2017-04-23 LAB — CBC
HCT: 40.7 % (ref 35.0–47.0)
HEMOGLOBIN: 13.7 g/dL (ref 12.0–16.0)
MCH: 31.6 pg (ref 26.0–34.0)
MCHC: 33.6 g/dL (ref 32.0–36.0)
MCV: 94.1 fL (ref 80.0–100.0)
PLATELETS: 288 10*3/uL (ref 150–440)
RBC: 4.33 MIL/uL (ref 3.80–5.20)
RDW: 13.9 % (ref 11.5–14.5)
WBC: 6.1 10*3/uL (ref 3.6–11.0)

## 2017-04-24 ENCOUNTER — Ambulatory Visit
Admission: RE | Admit: 2017-04-24 | Discharge: 2017-04-24 | Disposition: A | Discharge status: Home / Self Care | Payer: 59 | Source: Ambulatory Visit | Attending: Radiation Oncology | Admitting: Radiation Oncology

## 2017-04-24 DIAGNOSIS — Z170 Estrogen receptor positive status [ER+]: Secondary | ICD-10-CM | POA: Diagnosis not present

## 2017-04-24 DIAGNOSIS — D0511 Intraductal carcinoma in situ of right breast: Secondary | ICD-10-CM | POA: Diagnosis not present

## 2017-04-24 DIAGNOSIS — Z510 Encounter for antineoplastic radiation therapy: Secondary | ICD-10-CM | POA: Diagnosis not present

## 2017-04-25 ENCOUNTER — Ambulatory Visit
Admission: RE | Admit: 2017-04-25 | Discharge: 2017-04-25 | Disposition: A | Discharge status: Home / Self Care | Payer: 59 | Source: Ambulatory Visit | Attending: Radiation Oncology | Admitting: Radiation Oncology

## 2017-04-25 DIAGNOSIS — D0511 Intraductal carcinoma in situ of right breast: Secondary | ICD-10-CM | POA: Diagnosis not present

## 2017-04-25 DIAGNOSIS — Z510 Encounter for antineoplastic radiation therapy: Secondary | ICD-10-CM | POA: Diagnosis not present

## 2017-04-25 DIAGNOSIS — Z170 Estrogen receptor positive status [ER+]: Secondary | ICD-10-CM | POA: Diagnosis not present

## 2017-04-26 ENCOUNTER — Ambulatory Visit
Admission: RE | Admit: 2017-04-26 | Discharge: 2017-04-26 | Disposition: A | Discharge status: Home / Self Care | Payer: 59 | Source: Ambulatory Visit | Attending: Radiation Oncology | Admitting: Radiation Oncology

## 2017-04-26 DIAGNOSIS — Z170 Estrogen receptor positive status [ER+]: Secondary | ICD-10-CM | POA: Diagnosis not present

## 2017-04-26 DIAGNOSIS — D0511 Intraductal carcinoma in situ of right breast: Secondary | ICD-10-CM | POA: Diagnosis not present

## 2017-04-26 DIAGNOSIS — Z510 Encounter for antineoplastic radiation therapy: Secondary | ICD-10-CM | POA: Diagnosis not present

## 2017-04-29 ENCOUNTER — Ambulatory Visit
Admission: RE | Admit: 2017-04-29 | Discharge: 2017-04-29 | Disposition: A | Discharge status: Home / Self Care | Payer: 59 | Source: Ambulatory Visit | Attending: Radiation Oncology | Admitting: Radiation Oncology

## 2017-04-29 DIAGNOSIS — D0511 Intraductal carcinoma in situ of right breast: Secondary | ICD-10-CM | POA: Diagnosis not present

## 2017-04-29 DIAGNOSIS — Z170 Estrogen receptor positive status [ER+]: Secondary | ICD-10-CM | POA: Diagnosis not present

## 2017-04-29 DIAGNOSIS — Z510 Encounter for antineoplastic radiation therapy: Secondary | ICD-10-CM | POA: Diagnosis not present

## 2017-04-30 ENCOUNTER — Ambulatory Visit
Admission: RE | Admit: 2017-04-30 | Discharge: 2017-04-30 | Disposition: A | Discharge status: Home / Self Care | Payer: 59 | Source: Ambulatory Visit | Attending: Radiation Oncology | Admitting: Radiation Oncology

## 2017-04-30 DIAGNOSIS — D0511 Intraductal carcinoma in situ of right breast: Secondary | ICD-10-CM | POA: Diagnosis not present

## 2017-04-30 DIAGNOSIS — Z510 Encounter for antineoplastic radiation therapy: Secondary | ICD-10-CM | POA: Diagnosis not present

## 2017-04-30 DIAGNOSIS — Z170 Estrogen receptor positive status [ER+]: Secondary | ICD-10-CM | POA: Diagnosis not present

## 2017-05-01 ENCOUNTER — Ambulatory Visit
Admission: RE | Admit: 2017-05-01 | Discharge: 2017-05-01 | Disposition: A | Discharge status: Home / Self Care | Payer: 59 | Source: Ambulatory Visit | Attending: Radiation Oncology | Admitting: Radiation Oncology

## 2017-05-01 DIAGNOSIS — D0511 Intraductal carcinoma in situ of right breast: Secondary | ICD-10-CM | POA: Diagnosis not present

## 2017-05-01 DIAGNOSIS — Z170 Estrogen receptor positive status [ER+]: Secondary | ICD-10-CM | POA: Diagnosis not present

## 2017-05-01 DIAGNOSIS — Z510 Encounter for antineoplastic radiation therapy: Secondary | ICD-10-CM | POA: Diagnosis not present

## 2017-05-02 ENCOUNTER — Ambulatory Visit
Admission: RE | Admit: 2017-05-02 | Discharge: 2017-05-02 | Disposition: A | Discharge status: Home / Self Care | Payer: 59 | Source: Ambulatory Visit | Attending: Radiation Oncology | Admitting: Radiation Oncology

## 2017-05-02 DIAGNOSIS — D0511 Intraductal carcinoma in situ of right breast: Secondary | ICD-10-CM | POA: Diagnosis not present

## 2017-05-02 DIAGNOSIS — Z510 Encounter for antineoplastic radiation therapy: Secondary | ICD-10-CM | POA: Diagnosis not present

## 2017-05-02 DIAGNOSIS — Z170 Estrogen receptor positive status [ER+]: Secondary | ICD-10-CM | POA: Diagnosis not present

## 2017-05-03 ENCOUNTER — Ambulatory Visit
Admission: RE | Admit: 2017-05-03 | Discharge: 2017-05-03 | Disposition: A | Discharge status: Home / Self Care | Payer: 59 | Source: Ambulatory Visit | Attending: Radiation Oncology | Admitting: Radiation Oncology

## 2017-05-03 DIAGNOSIS — D0511 Intraductal carcinoma in situ of right breast: Secondary | ICD-10-CM | POA: Diagnosis not present

## 2017-05-03 DIAGNOSIS — Z510 Encounter for antineoplastic radiation therapy: Secondary | ICD-10-CM | POA: Diagnosis not present

## 2017-05-03 DIAGNOSIS — Z170 Estrogen receptor positive status [ER+]: Secondary | ICD-10-CM | POA: Diagnosis not present

## 2017-05-08 DIAGNOSIS — R399 Unspecified symptoms and signs involving the genitourinary system: Secondary | ICD-10-CM | POA: Diagnosis not present

## 2017-05-16 DIAGNOSIS — R05 Cough: Secondary | ICD-10-CM | POA: Diagnosis not present

## 2017-05-16 DIAGNOSIS — J069 Acute upper respiratory infection, unspecified: Secondary | ICD-10-CM | POA: Diagnosis not present

## 2017-05-17 DIAGNOSIS — R9389 Abnormal findings on diagnostic imaging of other specified body structures: Secondary | ICD-10-CM | POA: Diagnosis not present

## 2017-05-31 DIAGNOSIS — R918 Other nonspecific abnormal finding of lung field: Secondary | ICD-10-CM | POA: Diagnosis not present

## 2017-06-05 ENCOUNTER — Ambulatory Visit
Admission: RE | Admit: 2017-06-05 | Discharge: 2017-06-05 | Disposition: A | Discharge status: Home / Self Care | Payer: 59 | Source: Ambulatory Visit | Attending: Oncology | Admitting: Oncology

## 2017-06-05 ENCOUNTER — Telehealth: Payer: Self-pay | Admitting: *Deleted

## 2017-06-05 DIAGNOSIS — M85851 Other specified disorders of bone density and structure, right thigh: Secondary | ICD-10-CM | POA: Diagnosis not present

## 2017-06-05 DIAGNOSIS — D0511 Intraductal carcinoma in situ of right breast: Secondary | ICD-10-CM | POA: Diagnosis not present

## 2017-06-05 HISTORY — DX: Personal history of irradiation: Z92.3

## 2017-06-05 NOTE — Telephone Encounter (Signed)
Pt had bone density today and -1.6 and reviewed the last one which was 08/03/2013 and it was -1.6.  Patient is on calcium and vitamin D and the results have not changed in 4 years. She will stay on same medication.

## 2017-06-10 ENCOUNTER — Other Ambulatory Visit: Payer: Self-pay

## 2017-06-10 ENCOUNTER — Ambulatory Visit
Admission: RE | Admit: 2017-06-10 | Discharge: 2017-06-10 | Disposition: A | Discharge status: Home / Self Care | Payer: 59 | Source: Ambulatory Visit | Attending: Radiation Oncology | Admitting: Radiation Oncology

## 2017-06-10 ENCOUNTER — Encounter: Payer: Self-pay | Admitting: Radiation Oncology

## 2017-06-10 VITALS — BP 149/83 | HR 84 | Temp 96.2°F | Resp 18 | Wt 165.7 lb

## 2017-06-10 DIAGNOSIS — C50111 Malignant neoplasm of central portion of right female breast: Secondary | ICD-10-CM

## 2017-06-10 DIAGNOSIS — R05 Cough: Secondary | ICD-10-CM | POA: Diagnosis not present

## 2017-06-10 DIAGNOSIS — Z923 Personal history of irradiation: Secondary | ICD-10-CM | POA: Diagnosis not present

## 2017-06-10 DIAGNOSIS — Z170 Estrogen receptor positive status [ER+]: Secondary | ICD-10-CM | POA: Insufficient documentation

## 2017-06-10 DIAGNOSIS — D0511 Intraductal carcinoma in situ of right breast: Secondary | ICD-10-CM | POA: Diagnosis not present

## 2017-06-10 NOTE — Progress Notes (Signed)
Radiation Oncology Follow up Note  Name: Makayla Rice   Date:   06/10/2017 MRN:  [id number] DOB: 07-Dec-1950    This 67 y.o. female presents to the clinic today for ne-month follow-up status post whole breast radiation to her right breast for ER/PR positive ductal carcinoma in situ.  REFERRING PROVIDER: Adin Hector, MD  HPI: Patient is a 67 year old female now seen out 1 month having completed whole breast radiation to her right breast for ER/PR positive ductal carcinoma in situ status post wide local excision seen today in routine follow-up she is doing well she still has a persistent cough that predates her radiation treatments. It is nonproductive.she stated she has spoken to medical oncology although has not been started on tamoxifen. She has some slight tenderness in her breast specifically denies  Or bone pain .  COMPLICATIONS OF TREATMENT: none  FOLLOW UP COMPLIANCE: keeps appointments   PHYSICAL EXAM:  BP (!) 149/83   Pulse 84   Temp (!) 96.2 F (35.7 C)   Resp 18   Wt 165 lb 10.8 oz (75.1 kg)   BMI 28.44 kg/m  Lungs are clear to A&P cardiac examination essentially unremarkable with regular rate and rhythm. No dominant mass or nodularity is noted in either breast in 2 positions examined. Incision is well-healed. No axillary or supraclavicular adenopathy is appreciated. Cosmetic result is excellent.still some slight hyperpigmentation of her skin. Well-developed well-nourished patient in NAD. HEENT reveals PERLA, EOMI, discs not visualized.  Oral cavity is clear. No oral mucosal lesions are identified. Neck is clear without evidence of cervical or supraclavicular adenopathy. Lungs are clear to A&P. Cardiac examination is essentially unremarkable with regular rate and rhythm without murmur rub or thrill. Abdomen is benign with no organomegaly or masses noted. Motor sensory and DTR levels are equal and symmetric in the upper and lower extremities. Cranial nerves II through XII are  grossly intact. Proprioception is intact. No peripheral adenopathy or edema is identified. No motor or sensory levels are noted. Crude visual fields are within normal range.  RADIOLOGY RESULTS: no current films for review  PLAN: present time patient is doing well I've explained to her she probably developed some sort of virus several months ago and is resolving with chronic persistent cough. Her lungs are clear we'll continue to observe that. I've also asked her to contact Dr. Tollie Pizza about tamoxifen therapy for medical oncology. I've asked to see her back in 4-5 months for follow-up. Patient is to call sooner with any concerns.  I would like to take this opportunity to thank you for allowing me to participate in the care of your patient.Noreene Filbert, MD

## 2017-06-14 ENCOUNTER — Ambulatory Visit (INDEPENDENT_AMBULATORY_CARE_PROVIDER_SITE_OTHER): Payer: Self-pay | Admitting: Nurse Practitioner

## 2017-06-14 VITALS — BP 145/90 | HR 82 | Temp 97.8°F | Resp 16

## 2017-06-14 DIAGNOSIS — R059 Cough, unspecified: Secondary | ICD-10-CM

## 2017-06-14 DIAGNOSIS — R05 Cough: Secondary | ICD-10-CM

## 2017-06-14 MED ORDER — MONTELUKAST SODIUM 10 MG PO TABS: 10.0000 mg | ORAL_TABLET | Freq: Every day | ORAL | 0 refills | Status: DC

## 2017-06-14 MED ORDER — ALBUTEROL SULFATE HFA 108 (90 BASE) MCG/ACT IN AERS: 2.0000 | INHALATION_SPRAY | Freq: Four times a day (QID) | RESPIRATORY_TRACT | 0 refills | Status: DC | PRN

## 2017-06-14 MED ORDER — BENZONATATE 200 MG PO CAPS: 200.0000 mg | ORAL_CAPSULE | Freq: Two times a day (BID) | ORAL | 0 refills | Status: AC | PRN

## 2017-06-14 NOTE — Patient Instructions (Signed)
Cough, Adult  Coughing is a reflex that clears your throat and your airways. Coughing helps to heal and protect your lungs. It is normal to cough occasionally, but a cough that happens with other symptoms or lasts a long time may be a sign of a condition that needs treatment. A cough may last only 2-3 weeks (acute), or it may last longer than 8 weeks (chronic).  What are the causes?  Coughing is commonly caused by:   Breathing in substances that irritate your lungs.   A viral or bacterial respiratory infection.   Allergies.   Asthma.   Postnasal drip.   Smoking.   Acid backing up from the stomach into the esophagus (gastroesophageal reflux).   Certain medicines.   Chronic lung problems, including COPD (or rarely, lung cancer).   Other medical conditions such as heart failure.    Follow these instructions at home:  Pay attention to any changes in your symptoms. Take these actions to help with your discomfort:   Take medicines only as told by your health care provider.  ? If you were prescribed an antibiotic medicine, take it as told by your health care provider. Do not stop taking the antibiotic even if you start to feel better.  ? Talk with your health care provider before you take a cough suppressant medicine.   Drink enough fluid to keep your urine clear or pale yellow.   If the air is dry, use a cold steam vaporizer or humidifier in your bedroom or your home to help loosen secretions.   Avoid anything that causes you to cough at work or at home.   If your cough is worse at night, try sleeping in a semi-upright position.   Avoid cigarette smoke. If you smoke, quit smoking. If you need help quitting, ask your health care provider.   Avoid caffeine.   Avoid alcohol.   Rest as needed.    Contact a health care provider if:   You have new symptoms.   You cough up pus.   Your cough does not get better after 2-3 weeks, or your cough gets worse.   You cannot control your cough with suppressant  medicines and you are losing sleep.   You develop pain that is getting worse or pain that is not controlled with pain medicines.   You have a fever.   You have unexplained weight loss.   You have night sweats.  Get help right away if:   You cough up blood.   You have difficulty breathing.   Your heartbeat is very fast.  This information is not intended to replace advice given to you by your health care provider. Make sure you discuss any questions you have with your health care provider.  Document Released: 06/23/2010 Document Revised: 06/02/2015 Document Reviewed: 03/03/2014  Elsevier Interactive Patient Education  2018 [employer]

## 2017-06-14 NOTE — Progress Notes (Signed)
Subjective:     Makayla Rice is a 67 y.o. female here for evaluation of a cough. Onset of symptoms was 5 months ago. Symptoms have been unchanged since that time. The cough is productive of clear sputum and is aggravated by nothing. Associated symptoms include: sputum production and wheezing. Patient does not have a history of asthma. Patient does not have a history of environmental allergens. Patient has not traveled recently. Patient does not have a history of smoking. Patient has had a previous chest x-ray. Patient has not had a PPD done.  The patient was recently treated for pneumonia within the past 2-3 weeks.  The patient states her cough has persisted.  Patient had an x-ray at that time with results of early pneumonia.  Patient had a repeat chest x-ray 2 weeks after her treatment which resulted normal.  The patient has been taking Tessalon Perles and Albuterol for her cough and wheezing.    The following portions of the patient's history were reviewed and updated as appropriate: allergies, current medications and past medical history.  Review of Systems Constitutional: negative Eyes: negative Ears, nose, mouth, throat, and face: negative Respiratory: positive for cough, pneumonia, sputum and wheezing, negative for dyspnea on exertion, emphysema and hemoptysis Cardiovascular: negative Allergic/Immunologic: negative    Objective:   BP (!) 145/90 (BP Location: Right Arm, Patient Position: Sitting, Cuff Size: Normal)   Pulse 82   Temp 97.8 F (36.6 C) (Oral)   Resp 16   SpO2 97%  General appearance: alert, cooperative, fatigued and no distress Head: Normocephalic, without obvious abnormality, atraumatic Eyes: conjunctivae/corneas clear. PERRL, EOM's intact. Fundi benign. Ears: normal TM's and external ear canals both ears Nose: Nares normal. Septum midline. Mucosa normal. No drainage or sinus tenderness. Throat: lips, mucosa, and tongue normal; teeth and gums normal Lungs: clear to  auscultation bilaterally Heart: regular rate and rhythm, S1, S2 normal, no murmur, click, rub or gallop Abdomen: soft, non-tender; bowel sounds normal; no masses,  no organomegaly Pulses: 2+ and symmetric Skin: Skin color, texture, turgor normal. No rashes or lesions Lymph nodes: cervical and submandibular nodes normal Neurologic: Grossly normal    Assessment:    Cough    Plan:    Explained lack of efficacy of antibiotics in viral disease. Antitussives per medication orders. Avoid exposure to tobacco smoke and fumes. B-agonist inhaler. Call if shortness of breath worsens, blood in sputum, change in character of cough, development of fever or chills, inability to maintain nutrition and hydration. Avoid exposure to tobacco smoke and fumes. Discussed with patient the follow up with pulmonologist due the the persistent cough.  Discussed with patient that cough may related to asthma, but would like follow up.  Patient instructed to increease fluids.  Patient prescribed Albuterol, Tessalon Perles and Singulair for symptomatic treatment.  Patient verbalizes understanding and has no questions at time of discharge.     Meds ordered this encounter  Medications  . benzonatate (TESSALON) 200 MG capsule    Sig: Take 1 capsule (200 mg total) by mouth 2 (two) times daily as needed for up to 10 days for cough.    Dispense:  30 capsule    Refill:  0    Order Specific Question:   Supervising Provider    Answer:   Ricard Dillon [[id number]]  . albuterol (PROVENTIL HFA;VENTOLIN HFA) 108 (90 Base) MCG/ACT inhaler    Sig: Inhale 2 puffs into the lungs every 6 (six) hours as needed for wheezing or shortness of breath.  Dispense:  1 Inhaler    Refill:  0    Order Specific Question:   Supervising Provider    Answer:   Ricard Dillon [[id number]]  . montelukast (SINGULAIR) 10 MG tablet    Sig: Take 1 tablet (10 mg total) by mouth at bedtime.    Dispense:  30 tablet    Refill:  0    Order Specific Question:    Supervising Provider    Answer:   Ricard Dillon [phone]

## 2017-06-20 DIAGNOSIS — Z760 Encounter for issue of repeat prescription: Secondary | ICD-10-CM | POA: Diagnosis not present

## 2017-06-25 ENCOUNTER — Inpatient Hospital Stay: Payer: 59

## 2017-06-25 ENCOUNTER — Encounter: Payer: Self-pay | Admitting: Oncology

## 2017-06-25 ENCOUNTER — Inpatient Hospital Stay: Payer: 59 | Attending: Oncology | Admitting: Oncology

## 2017-06-25 ENCOUNTER — Other Ambulatory Visit: Payer: Self-pay

## 2017-06-25 VITALS — BP 149/80 | HR 71 | Temp 97.7°F | Resp 18 | Ht 64.0 in | Wt 163.2 lb

## 2017-06-25 DIAGNOSIS — D0511 Intraductal carcinoma in situ of right breast: Secondary | ICD-10-CM | POA: Diagnosis not present

## 2017-06-25 DIAGNOSIS — M8580 Other specified disorders of bone density and structure, unspecified site: Secondary | ICD-10-CM | POA: Diagnosis not present

## 2017-06-25 DIAGNOSIS — H401131 Primary open-angle glaucoma, bilateral, mild stage: Secondary | ICD-10-CM | POA: Diagnosis not present

## 2017-06-25 DIAGNOSIS — Z170 Estrogen receptor positive status [ER+]: Secondary | ICD-10-CM | POA: Insufficient documentation

## 2017-06-25 DIAGNOSIS — Z923 Personal history of irradiation: Secondary | ICD-10-CM | POA: Insufficient documentation

## 2017-06-25 LAB — COMPREHENSIVE METABOLIC PANEL
ALBUMIN: 3.9 g/dL (ref 3.5–5.0)
ALT: 19 U/L (ref 14–54)
ANION GAP: 7 (ref 5–15)
AST: 26 U/L (ref 15–41)
Alkaline Phosphatase: 55 U/L (ref 38–126)
BILIRUBIN TOTAL: 0.6 mg/dL (ref 0.3–1.2)
BUN: 15 mg/dL (ref 6–20)
CHLORIDE: 109 mmol/L (ref 101–111)
CO2: 23 mmol/L (ref 22–32)
Calcium: 9.3 mg/dL (ref 8.9–10.3)
Creatinine, Ser: 0.78 mg/dL (ref 0.44–1.00)
GFR calc Af Amer: >60 mL/min (ref >60)
GFR calc non Af Amer: >60 mL/min (ref >60)
GLUCOSE: 121 mg/dL — AB (ref 65–99)
POTASSIUM: 4.1 mmol/L (ref 3.5–5.1)
SODIUM: 139 mmol/L (ref 135–145)
Total Protein: 7.1 g/dL (ref 6.5–8.1)

## 2017-06-25 NOTE — Progress Notes (Signed)
Nervous about starting the AI

## 2017-06-25 NOTE — Progress Notes (Signed)
Hematology/Oncology Consult note [facility]  [phone] [phone]  Patient Care Team: Adin Hector, MD as PCP - General (Internal Medicine)   Name of the patient: Makayla Rice  [id number]  [birth year]   Date of visit: 06/25/17  Diagnosis- right breast DCIS    Chief complaint/ Reason for visit- routine f/u of dcis  Heme/Onc history: 1.Patient is a 67 year old female who underwent a bilateral breast mammogram on 11/02/2016 which showed group of heterogeneous calcifications in the superior right breast for which DCIS cannot be excluded. No evidence of malignancy in the left breast.  2.Patient underwent biopsy of these calcifications which revealed DCIS micropapillary type with associated calcifications. Tumor was greater than 90% ER positive and 11-50% PR positive.   3.Patient has seen Dr. Bary Castilla.She was deemed to be a candidate for, trial which is looking at standard of care treatment which would be surgery followed by adjuvant radiation and hormone therapy versus hormone therapy alone versus observation  4. She is G5P5L5. Used hormone contraception briefly in the remote past. Had hysterectomy for fibroids in 1990's. Family history significant for ovarian cancer in her sister in the 13's. Personal history of kidney cancer. No other h/o colon, pancreatic, stomach or breast cancer or melanoma  5. Patient underwent lumpectomy on with wide excision on 03/04/17. Pathology showed: DCIS atleast 2 cm, grade 2 with micropapillary features. Margins were close (posterior margin <1 mm). ER >90% positive, PR 11-50% positive. Per Dr. Dwyane Luo note- The posterior margin was pectoralis fascia and this tissue not removed in the specimen. Re excision therefore not planned  Interval history- she has recovered well from her radiation. She has not yet started her arimidex. Denies any complaints today  ECOG PS- 0 Pain scale- 0  Review of  systems- Review of Systems  Constitutional: Negative for chills, fever, malaise/fatigue and weight loss.  HENT: Negative for congestion, ear discharge and nosebleeds.   Eyes: Negative for blurred vision.  Respiratory: Negative for cough, hemoptysis, sputum production, shortness of breath and wheezing.   Cardiovascular: Negative for chest pain, palpitations, orthopnea and claudication.  Gastrointestinal: Negative for abdominal pain, blood in stool, constipation, diarrhea, heartburn, melena, nausea and vomiting.  Genitourinary: Negative for dysuria, flank pain, frequency, hematuria and urgency.  Musculoskeletal: Negative for back pain, joint pain and myalgias.  Skin: Negative for rash.  Neurological: Negative for dizziness, tingling, focal weakness, seizures, weakness and headaches.  Endo/Heme/Allergies: Does not bruise/bleed easily.  Psychiatric/Behavioral: Negative for depression and suicidal ideas. The patient does not have insomnia.       Allergies  Allergen Reactions  . Sulfamethoxazole-Trimethoprim Rash  . Ciprofloxacin     Other reaction(s): Headache  . Doxycycline Monohydrate     Other reaction(s): Unknown  . Sulfa Antibiotics Other (See Comments)  . Sulfasalazine Other (See Comments)  . Tetracycline Other (See Comments)  . Tetracyclines & Related     Other reaction(s): Unknown  . Bacitracin Rash  . Metronidazole Rash     Past Medical History:  Diagnosis Date  . Arthritis   . Breast cancer ([place]) 11/2016   DCIS  . Cancer [facility]) 2013   kidney cancer  . Complication of anesthesia   . History of kidney stones   . Hyperlipidemia   . Hypertension   . Personal history of radiation therapy 2019   F/u right breast ca  . PONV (postoperative nausea and vomiting)    AFTER ROBOTIC KIDNEY SURGERY  . 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 ABDOMINAL HYSTERECTOMY    . BREAST BIOPSY Right 11/13/2016   Affirm Bx-path DCIS    . BREAST EXCISIONAL BIOPSY Right 03/04/2017   lumpectomy wit np DCIS   . BREAST LUMPECTOMY Right 02/2017   DCIS  . BREAST LUMPECTOMY WITH NEEDLE LOCALIZATION Right 03/04/2017   Procedure: BREAST LUMPECTOMY WITH NEEDLE LOCALIZATION;  Surgeon: [name], MD;  Location: [facility] ORS;  Service: General;  Laterality: Right;  . COLONOSCOPY  2018  . EYE SURGERY  2003  . KIDNEY SURGERY  2013   ROBOTIC SURGERY  . PARTIAL NEPHRECTOMY  2013    Social History   Socioeconomic History  . Marital status: Single    Spouse name: Not on file  . Number of children: 5  . Years of education: Not on file  . Highest education level: GED or equivalent  Occupational History  . Occupation: Child psychotherapist: [employer]  . Financial resource strain: Not hard at all  . Food insecurity:    Worry: Never true    Inability: Never true  . Transportation needs:    Medical: No    Non-medical: No  Tobacco Use  . Smoking status: Never Smoker  . Smokeless tobacco: Never Used  Substance and Sexual Activity  . Alcohol use: Yes    Alcohol/week: 0.0 oz    Comment: WINE RARELY  . Drug use: No  . Sexual activity: Yes  Lifestyle  . Physical activity:    Days per week: 5 days    Minutes per session: 70 min  . Stress: Not at all  Relationships  . Social connections:    Talks on phone: Twice a week    Gets together: Once a week    Attends religious service: More than 4 times per year    Active member of club or organization: Yes    Attends meetings of clubs or organizations: Never    Relationship status: Divorced  . Intimate partner violence:    Fear of current or ex partner: No    Emotionally abused: No    Physically abused: No    Forced sexual activity: No  Other Topics Concern  . Not on file  Social History Narrative  . Not on file    Family History  Problem Relation Age of Onset  . Ovarian cancer Sister   . Cancer Son   . Breast cancer Neg Hx      Current  Outpatient Medications:  .  albuterol (PROVENTIL HFA;VENTOLIN HFA) 108 (90 Base) MCG/ACT inhaler, Inhale 2 puffs into the lungs every 6 (six) hours as needed for wheezing or shortness of breath., Disp: 1 Inhaler, Rfl: 0 .  albuterol (PROVENTIL HFA;VENTOLIN HFA) 108 (90 Base) MCG/ACT inhaler, Inhale 2 puffs into the lungs every 6 (six) hours as needed for wheezing or shortness of breath., Disp: 1 Inhaler, Rfl: 0 .  aspirin EC 81 MG tablet, Take 81 mg daily by mouth. , Disp: , Rfl:  .  azelastine (ASTELIN) 0.1 % nasal spray, Place 1 spray into both nostrils daily as needed. For cold symptoms, Disp: , Rfl: 5 .  benzonatate (TESSALON) 200 MG capsule, Take 200 mg by mouth 3 (three) times daily as needed for cough., Disp: , Rfl: 0 .  Calcium Carbonate-Vit D-Min (CALCIUM 600+D3 PLUS MINERALS PO), Take 1 tablet by mouth daily., Disp: , Rfl:  .  hydrocortisone 2.5 % ointment, Apply  1 application topically 2 (two) times daily as needed. For affected areas of skin, Disp: , Rfl: 2 .  levobunolol (BETAGAN) 0.5 % ophthalmic solution, Place 1 drop into both eyes daily. , Disp: , Rfl:  .  loratadine (CLARITIN) 10 MG tablet, Take 10 mg by mouth daily as needed for allergies., Disp: , Rfl:  .  lovastatin (MEVACOR) 40 MG tablet, Take 80 mg by mouth at bedtime. , Disp: , Rfl:  .  metoprolol tartrate (LOPRESSOR) 25 MG tablet, Take 25 mg by mouth every morning. TAKE ONE TABLET ONCE DAILY FOR BLOOD PRESSURE, Disp: , Rfl:  .  montelukast (SINGULAIR) 10 MG tablet, Take 1 tablet (10 mg total) by mouth at bedtime., Disp: 30 tablet, Rfl: 0 .  Multiple Vitamin (MULTIVITAMIN WITH MINERALS) TABS tablet, Take 1 tablet by mouth daily. Centrum Silver, Disp: , Rfl:  .  prednisoLONE acetate (PRED FORTE) 1 % ophthalmic suspension, Place 1 drop into the left eye daily., Disp: , Rfl:  .  cefixime (SUPRAX) 400 MG CAPS capsule, Take 400 mg by mouth daily. , Disp: , Rfl:  .  cephALEXin (KEFLEX) 500 MG capsule, , Disp: , Rfl: 0 .   Cholecalciferol (VITAMIN D3) 2000 UNITS capsule, Take 2,000 Units daily by mouth. , Disp: , Rfl:  .  ciprofloxacin (CIPRO) 500 MG tablet, , Disp: , Rfl: 0 .  MONUROL 3 g PACK, , Disp: , Rfl: 0 .  predniSONE (DELTASONE) 10 MG tablet, , Disp: , Rfl: 0  Physical exam:  Vitals:   06/25/17 1400  BP: (!) 149/80  Pulse: 71  Resp: 18  Temp: 97.7 F (36.5 C)  TempSrc: Oral  Weight: 163 lb 3.2 oz (74 kg)  Height: 5\' 4"  (1.626 m)   Physical Exam  Constitutional: She is oriented to person, place, and time.  HENT:  Head: Normocephalic and atraumatic.  Eyes: Pupils are equal, round, and reactive to light. EOM are normal.  Neck: Normal range of motion.  Cardiovascular: Normal rate, regular rhythm and normal heart sounds.  Pulmonary/Chest: Effort normal and breath sounds normal.  Abdominal: Soft. Bowel sounds are normal.  Neurological: She is alert and oriented to person, place, and time.  Skin: Skin is warm and dry.     CMP Latest Ref Rng & Units 06/25/2017  Glucose 65 - 99 mg/dL 121(H)  BUN 6 - 20 mg/dL 15  Creatinine 0.44 - 1.00 mg/dL 0.78  Sodium 135 - 145 mmol/L 139  Potassium 3.5 - 5.1 mmol/L 4.1  Chloride 101 - 111 mmol/L 109  CO2 22 - 32 mmol/L 23  Calcium 8.9 - 10.3 mg/dL 9.3  Total Protein 6.5 - 8.1 g/dL 7.1  Total Bilirubin 0.3 - 1.2 mg/dL 0.6  Alkaline Phos 38 - 126 U/L 55  AST 15 - 41 U/L 26  ALT 14 - 54 U/L 19   CBC Latest Ref Rng & Units 04/23/2017  WBC 3.6 - 11.0 K/uL 6.1  Hemoglobin 12.0 - 16.0 g/dL 13.7  Hematocrit 35.0 - 47.0 % 40.7  Platelets 150 - 440 K/uL 288    No images are attached to the encounter.  Dg Bone Density  Result Date: 06/05/2017 EXAM: DUAL X-RAY ABSORPTIOMETRY (DXA) FOR BONE MINERAL DENSITY IMPRESSION: Dear Dr Janese Banks, Your patient Marka Treloar completed a FRAX assessment on 06/05/2017 using the [place] (analysis version: 14.10) manufactured by [employer]. The following summarizes the results of our evaluation. PATIENT  BIOGRAPHICAL: Name: Mahima, Hottle Patient ID: [id number] Birth Date: 09-22-1950 Height:  63.5 in. Gender:     Female    [age]       Weight:    163.2 lbs. Ethnicity:  Black                            Exam Date: 06/05/2017 FRAX* RESULTS:  (version: 3.5) 10-year Probability of Fracture1 Major Osteoporotic Fracture2 Hip Fracture 4.1% 0.5% Population: [place] (Black) Risk Factors: None Based on Femur (Right) Neck BMD 1 -The 10-year probability of fracture may be lower than reported if the patient has received treatment. 2 -Major Osteoporotic Fracture: Clinical Spine, Forearm, Hip or Shoulder *FRAX is a Materials engineer of the [employer] for Metabolic Bone Disease, a [place] (WHO) [facility]. ASSESSMENT: The probability of a major osteoporotic fracture is 4.1% within the next ten years. The probability of a hip fracture is 0.5% within the next ten years. . Dear Dr Janese Banks, Your patient Tonja Jezewski completed a BMD test on 06/05/2017 using the [place] (analysis version: 14.10) manufactured by [employer]. The following summarizes the results of our evaluation. PATIENT BIOGRAPHICAL: Name: Meliza, Kage Patient ID: [id number] Birth Date: [birth year] Height: 63.5 in. Gender: Female Exam Date: 06/05/2017 Weight: 163.2 lbs. Indications: Breast CA, Height Loss, History of Radiation, Hysterectomy, Surgical Induced Menopause Fractures: Treatments: calcium w/ vit D, Claritin, Multi-Vitamin, Vitamin D ASSESSMENT: The BMD measured at Femur Neck Right is 0.809 g/cm2 with a T-score of -1.6. This patient is considered osteopenic according to [facility]) criteria. Site Region Measured Measured WHO Young Adult BMD Date       Age      Classification T-score AP Spine L1-L4 06/05/2017 64.6 Normal -0.4 1.151 g/cm2 DualFemur Neck Right 06/05/2017 64.6 Osteopenia -1.6 0.809 g/cm2 World Health Organization [facility]) criteria for post-menopausal,  Caucasian Women: Normal:       T-score at or above -1 SD Osteopenia:   T-score between -1 and -2.5 SD Osteoporosis: T-score at or below -2.5 SD RECOMMENDATIONS: 1. All patients should optimize calcium and vitamin D intake. 2. Consider FDA-approved medical therapies in postmenopausal women and men aged 31 years and older, based on the following: a. A hip or vertebral(clinical or morphometric) fracture b. T-score < -2.5 at the femoral neck or spine after appropriate evaluation to exclude secondary causes c. Low bone mass (T-score between -1.0 and -2.5 at the femoral neck or spine) and a 10-year probability of a hip fracture > 3% or a 10-year probability of a major osteoporosis-related fracture > 20% based on the [place]-adapted WHO algorithm d. Clinician judgment and/or patient preferences may indicate treatment for people with 10-year fracture probabilities above or below these levels FOLLOW-UP: People with diagnosed cases of osteoporosis or at high risk for fracture should have regular bone mineral density tests. For patients eligible for Medicare, routine testing is allowed once every 2 years. The testing frequency can be increased to one year for patients who have rapidly progressing disease, those who are receiving or discontinuing medical therapy to restore bone mass, or have additional risk factors. I have reviewed this report, and agree with the above findings. [facility] Radiology Electronically Signed   By: Lowella Grip III M.D.   On: 06/05/2017 11:32     Assessment and plan- Patient is a 67 y.o. female with right breast DCIS ER + s/p lumpectomy and adjuvant RT  Patient has recovered well from RT  and will proceed with arimidex at this time. Also reviewed results of bone density scan which revealed osteopenia. She will therefore be on calcium 1200 gm and vit D 800 IU daily. I will see her back in 6 weeks and see how she is tolerating her medication   Visit Diagnosis 1. Ductal carcinoma in situ (DCIS)  of right breast      Dr. Randa Evens, MD, MPH [facility] [phone] 06/25/2017 3:53 PM

## 2017-06-26 ENCOUNTER — Other Ambulatory Visit: Payer: Self-pay | Admitting: *Deleted

## 2017-06-26 MED ORDER — ANASTROZOLE 1 MG PO TABS: 1.0000 mg | ORAL_TABLET | Freq: Every day | ORAL | 1 refills | Status: DC

## 2017-07-02 DIAGNOSIS — Z947 Corneal transplant status: Secondary | ICD-10-CM | POA: Diagnosis not present

## 2017-07-10 ENCOUNTER — Telehealth: Payer: Self-pay

## 2017-07-10 NOTE — Telephone Encounter (Signed)
Makayla Rice came into the office today. she is c/o she  started taking the Arimidex yesterday and she has had increase coughing with lower abdomen pain with tingling to bilateral finger tips. I have consult with Dr. B he says she need to go to her PCP for further care. Dr. B states that the Arimidex does not cause coughing. The patient was not so understanding and states she is going to stop taking the medication. I asked if she will like to be seen on Friday by Dr Janese Banks or symptom management clinic today.The patient refused to be seen and states she will be ok until Dr.Rao is back in the office.will consult with Dr. Janese Banks when she is back in office on Friday.

## 2017-07-16 ENCOUNTER — Telehealth: Payer: Self-pay

## 2017-07-16 ENCOUNTER — Ambulatory Visit
Admission: RE | Admit: 2017-07-16 | Discharge: 2017-07-16 | Disposition: A | Discharge status: Home / Self Care | Payer: 59 | Source: Ambulatory Visit | Attending: Oncology | Admitting: Oncology

## 2017-07-16 ENCOUNTER — Other Ambulatory Visit: Payer: Self-pay

## 2017-07-16 DIAGNOSIS — D0511 Intraductal carcinoma in situ of right breast: Secondary | ICD-10-CM | POA: Diagnosis not present

## 2017-07-16 NOTE — Telephone Encounter (Signed)
Spoke with the patient per Dr. Janese Banks the patient has been c/o increase coughing after starting Arimidex. Dr Janese Banks has asked for the patient to get a chest x/ray . The patient has agreed to go in tomorrow and get chest xay.

## 2017-07-16 NOTE — Telephone Encounter (Signed)
Left message for the patient to get in touch with me at this number [phone] and for her to hold the Arimidex at this time and if she could give me a call back to see if we can get her schedule for a Chest X ray.

## 2017-07-16 NOTE — Telephone Encounter (Signed)
-----   Message from Sindy Guadeloupe, MD sent at 07/16/2017  8:02 AM EDT ----- Regarding: FW: Coughing Contact: [phone] Please call this patient and ask her to hold arimidex at this time. Lets get a CXR and decide about further management based on that  Thanks, Archana ----- Message ----- From: Festus Holts Sent: 07/15/2017   4:07 PM To: Sindy Guadeloupe, MD, Vito Berger, CMA Subject: Coughing                                       Hi Dr. Janese Banks,  The above mentioned patient, stopped by to inform you that she has been coughing non stop since the 1st day that she started taking the new medication ARIMIDEX, that you recently prescribed for her. Patient also stated that she had been having lower abdominal pain as well, near her Ovaries. Patient is at my desk and coughing constantly.  Patient is requesting a call back regarding this matter.   Verdis Frederickson

## 2017-07-18 ENCOUNTER — Telehealth: Payer: Self-pay

## 2017-07-18 NOTE — Telephone Encounter (Signed)
-----   Message from Sindy Guadeloupe, MD sent at 07/18/2017  2:29 PM EDT ----- CXR shows no pneumonia. She should hold off on taking arimidex for 2-3 weeks and see if her cough is getting better. If it does, we can switch her to some other medication. Cough is likely unrelated to arimidex. Thanks, Astrid Divine

## 2017-07-18 NOTE — Telephone Encounter (Signed)
I have called and left message on the patient voice mail, per Dr Janese Banks the patient can hold Arimidex x 2-3 weeks. If the coughing has decrease, Dr Janese Banks states she can start her on some other medication when the patient is back in the office.

## 2017-07-30 DIAGNOSIS — S66912A Strain of unspecified muscle, fascia and tendon at wrist and hand level, left hand, initial encounter: Secondary | ICD-10-CM | POA: Diagnosis not present

## 2017-07-30 DIAGNOSIS — S161XXA Strain of muscle, fascia and tendon at neck level, initial encounter: Secondary | ICD-10-CM | POA: Diagnosis not present

## 2017-07-30 DIAGNOSIS — R350 Frequency of micturition: Secondary | ICD-10-CM | POA: Diagnosis not present

## 2017-07-30 DIAGNOSIS — W06XXXA Fall from bed, initial encounter: Secondary | ICD-10-CM | POA: Diagnosis not present

## 2017-08-01 DIAGNOSIS — H16002 Unspecified corneal ulcer, left eye: Secondary | ICD-10-CM | POA: Diagnosis not present

## 2017-08-02 DIAGNOSIS — H16002 Unspecified corneal ulcer, left eye: Secondary | ICD-10-CM | POA: Diagnosis not present

## 2017-08-05 NOTE — Progress Notes (Deleted)
Hematology/Oncology Consult note [facility]  Telephone:([phone] [phone]  Patient Care Team: Adin Hector, MD as PCP - General (Internal Medicine)   Name of the patient: Makayla Rice  [id number]  10/31/1950   Date of visit: 08/05/17  Diagnosis- right breast DCIS   Chief complaint/ Reason for visit- assess tolerance to arimidex  Heme/Onc history: 1.Patient is a 67 year old female who underwent a bilateral breast mammogram on 11/02/2016 which showed group of heterogeneous calcifications in the superior right breast for which DCIS cannot be excluded. No evidence of malignancy in the left breast.  2.Patient underwent biopsy of these calcifications which revealed DCIS micropapillary type with associated calcifications. Tumor was greater than 90% ER positive and 11-50% PR positive.   3.Patient has seen Dr. Bary Castilla.She was deemed to be a candidate for, trial which is looking at standard of care treatment which would be surgery followed by adjuvant radiation and hormone therapy versus hormone therapy alone versus observation  4. She is G5P5L5. Used hormone contraception briefly in the remote past. Had hysterectomy for fibroids in 1990's. Family history significant for ovarian cancer in her sister in the 57's. Personal history of kidney cancer. No other h/o colon, pancreatic, stomach or breast cancer or melanoma  5. Patient underwent lumpectomy on with wide excision on 03/04/17. Pathology showed: DCIS atleast 2 cm, grade 2 with micropapillary features. Margins were close (posterior margin <1 mm). ER >90% positive, PR 11-50% positive. Per Dr. Dwyane Luo note- The posterior margin was pectoralis fascia and this tissue not removed in the specimen. Re excision therefore not planned  6. Patient completed adjuvant RT and was started on arimidex in June 2019  Interval history- ***  ECOG PS- *** Pain scale- *** Opioid associated  constipation- ***  Review of systems- ROS   Current treatment- ***  Allergies  Allergen Reactions  . Sulfamethoxazole-Trimethoprim Rash  . Ciprofloxacin     Other reaction(s): Headache  . Doxycycline Monohydrate     Other reaction(s): Unknown  . Sulfa Antibiotics Other (See Comments)  . Sulfasalazine Other (See Comments)  . Tetracycline Other (See Comments)  . Tetracyclines & Related     Other reaction(s): Unknown  . Bacitracin Rash  . Metronidazole Rash     Past Medical History:  Diagnosis Date  . Arthritis   . Breast cancer ([employer]) 11/2016   DCIS  . Cancer [facility]) 2013   kidney cancer  . Complication of anesthesia   . History of kidney stones   . Hyperlipidemia   . Hypertension   . Personal history of radiation therapy 2019   F/u right breast ca  . PONV (postoperative nausea and vomiting)    AFTER ROBOTIC KIDNEY SURGERY  . 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 ABDOMINAL HYSTERECTOMY    . BREAST BIOPSY Right 11/13/2016   Affirm Bx-path DCIS  . BREAST EXCISIONAL BIOPSY Right 03/04/2017   lumpectomy wit np DCIS   . BREAST LUMPECTOMY Right 02/2017   DCIS  . BREAST LUMPECTOMY WITH NEEDLE LOCALIZATION Right 03/04/2017   Procedure: BREAST LUMPECTOMY WITH NEEDLE LOCALIZATION;  Surgeon: [name], MD;  Location: [facility] ORS;  Service: General;  Laterality: Right;  . COLONOSCOPY  2018  . EYE SURGERY  2003  . KIDNEY SURGERY  2013   ROBOTIC SURGERY  . PARTIAL NEPHRECTOMY  2013    Social History   Socioeconomic History  . Marital status: Single  Spouse name: Not on file  . Number of children: 5  . Years of education: Not on file  . Highest education level: GED or equivalent  Occupational History  . Occupation: Child psychotherapist: [employer]  . Financial resource strain: Not hard at all  . Food insecurity:    Worry: Never true    Inability: Never true  . Transportation  needs:    Medical: No    Non-medical: No  Tobacco Use  . Smoking status: Never Smoker  . Smokeless tobacco: Never Used  Substance and Sexual Activity  . Alcohol use: Yes    Alcohol/week: 0.0 oz    Comment: WINE RARELY  . Drug use: No  . Sexual activity: Yes  Lifestyle  . Physical activity:    Days per week: 5 days    Minutes per session: 70 min  . Stress: Not at all  Relationships  . Social connections:    Talks on phone: Twice a week    Gets together: Once a week    Attends religious service: More than 4 times per year    Active member of club or organization: Yes    Attends meetings of clubs or organizations: Never    Relationship status: Divorced  . Intimate partner violence:    Fear of current or ex partner: No    Emotionally abused: No    Physically abused: No    Forced sexual activity: No  Other Topics Concern  . Not on file  Social History Narrative  . Not on file    Family History  Problem Relation Age of Onset  . Ovarian cancer Sister   . Cancer Son   . Breast cancer Neg Hx      Current Outpatient Medications:  .  albuterol (PROVENTIL HFA;VENTOLIN HFA) 108 (90 Base) MCG/ACT inhaler, Inhale 2 puffs into the lungs every 6 (six) hours as needed for wheezing or shortness of breath., Disp: 1 Inhaler, Rfl: 0 .  albuterol (PROVENTIL HFA;VENTOLIN HFA) 108 (90 Base) MCG/ACT inhaler, Inhale 2 puffs into the lungs every 6 (six) hours as needed for wheezing or shortness of breath., Disp: 1 Inhaler, Rfl: 0 .  anastrozole (ARIMIDEX) 1 MG tablet, Take 1 tablet (1 mg total) by mouth daily., Disp: 30 tablet, Rfl: 1 .  aspirin EC 81 MG tablet, Take 81 mg daily by mouth. , Disp: , Rfl:  .  azelastine (ASTELIN) 0.1 % nasal spray, Place 1 spray into both nostrils daily as needed. For cold symptoms, Disp: , Rfl: 5 .  benzonatate (TESSALON) 200 MG capsule, Take 200 mg by mouth 3 (three) times daily as needed for cough., Disp: , Rfl: 0 .  Calcium Carbonate-Vit D-Min (CALCIUM  600+D3 PLUS MINERALS PO), Take 1 tablet by mouth daily., Disp: , Rfl:  .  cefixime (SUPRAX) 400 MG CAPS capsule, Take 400 mg by mouth daily. , Disp: , Rfl:  .  cephALEXin (KEFLEX) 500 MG capsule, , Disp: , Rfl: 0 .  Cholecalciferol (VITAMIN D3) 2000 UNITS capsule, Take 2,000 Units daily by mouth. , Disp: , Rfl:  .  ciprofloxacin (CIPRO) 500 MG tablet, , Disp: , Rfl: 0 .  hydrocortisone 2.5 % ointment, Apply 1 application topically 2 (two) times daily as needed. For affected areas of skin, Disp: , Rfl: 2 .  levobunolol (BETAGAN) 0.5 % ophthalmic solution, Place 1 drop into both eyes daily. , Disp: , Rfl:  .  loratadine (CLARITIN) 10 MG tablet, Take 10  mg by mouth daily as needed for allergies., Disp: , Rfl:  .  lovastatin (MEVACOR) 40 MG tablet, Take 80 mg by mouth at bedtime. , Disp: , Rfl:  .  metoprolol tartrate (LOPRESSOR) 25 MG tablet, Take 25 mg by mouth every morning. TAKE ONE TABLET ONCE DAILY FOR BLOOD PRESSURE, Disp: , Rfl:  .  montelukast (SINGULAIR) 10 MG tablet, Take 1 tablet (10 mg total) by mouth at bedtime., Disp: 30 tablet, Rfl: 0 .  MONUROL 3 g PACK, , Disp: , Rfl: 0 .  Multiple Vitamin (MULTIVITAMIN WITH MINERALS) TABS tablet, Take 1 tablet by mouth daily. Centrum Silver, Disp: , Rfl:  .  prednisoLONE acetate (PRED FORTE) 1 % ophthalmic suspension, Place 1 drop into the left eye daily., Disp: , Rfl:  .  predniSONE (DELTASONE) 10 MG tablet, , Disp: , Rfl: 0  Physical exam: There were no vitals filed for this visit. Physical Exam   CMP Latest Ref Rng & Units 06/25/2017  Glucose 65 - 99 mg/dL 121(H)  BUN 6 - 20 mg/dL 15  Creatinine 0.44 - 1.00 mg/dL 0.78  Sodium 135 - 145 mmol/L 139  Potassium 3.5 - 5.1 mmol/L 4.1  Chloride 101 - 111 mmol/L 109  CO2 22 - 32 mmol/L 23  Calcium 8.9 - 10.3 mg/dL 9.3  Total Protein 6.5 - 8.1 g/dL 7.1  Total Bilirubin 0.3 - 1.2 mg/dL 0.6  Alkaline Phos 38 - 126 U/L 55  AST 15 - 41 U/L 26  ALT 14 - 54 U/L 19   CBC Latest Ref Rng & Units  04/23/2017  WBC 3.6 - 11.0 K/uL 6.1  Hemoglobin 12.0 - 16.0 g/dL 13.7  Hematocrit 35.0 - 47.0 % 40.7  Platelets 150 - 440 K/uL 288    No images are attached to the encounter.  Dg Chest 2 View  Result Date: 07/17/2017 CLINICAL DATA:  Ductal carcinoma in situ right breast EXAM: CHEST - 2 VIEW COMPARISON:  None. FINDINGS: The heart size and mediastinal contours are within normal limits. Both lungs are clear. The visualized skeletal structures are unremarkable. IMPRESSION: No active cardiopulmonary disease. Electronically Signed   By: Franchot Gallo M.D.   On: 07/17/2017 08:17     Assessment and plan- Patient is a 67 y.o. female with right breast DCIS ER + s/p lumpectomy and adjuvant RT. She is here to assess tolerance to arimidex     Visit Diagnosis 1. Ductal carcinoma in situ (DCIS) of right breast      Dr. Randa Evens, MD, MPH [facility] [phone] 08/05/2017 4:28 PM

## 2017-08-06 ENCOUNTER — Inpatient Hospital Stay: Payer: 59

## 2017-08-06 ENCOUNTER — Inpatient Hospital Stay: Payer: 59 | Admitting: Oncology

## 2017-08-06 DIAGNOSIS — H16002 Unspecified corneal ulcer, left eye: Secondary | ICD-10-CM | POA: Diagnosis not present

## 2017-08-06 DIAGNOSIS — M1712 Unilateral primary osteoarthritis, left knee: Secondary | ICD-10-CM | POA: Diagnosis not present

## 2017-08-09 DIAGNOSIS — H16002 Unspecified corneal ulcer, left eye: Secondary | ICD-10-CM | POA: Diagnosis not present

## 2017-08-13 ENCOUNTER — Encounter: Payer: Self-pay | Admitting: General Surgery

## 2017-08-13 DIAGNOSIS — N63 Unspecified lump in unspecified breast: Secondary | ICD-10-CM | POA: Diagnosis not present

## 2017-08-13 DIAGNOSIS — D0511 Intraductal carcinoma in situ of right breast: Secondary | ICD-10-CM | POA: Diagnosis not present

## 2017-08-13 DIAGNOSIS — Z79899 Other long term (current) drug therapy: Secondary | ICD-10-CM | POA: Diagnosis not present

## 2017-08-13 DIAGNOSIS — I10 Essential (primary) hypertension: Secondary | ICD-10-CM | POA: Diagnosis not present

## 2017-08-13 DIAGNOSIS — E7800 Pure hypercholesterolemia, unspecified: Secondary | ICD-10-CM | POA: Diagnosis not present

## 2017-08-14 ENCOUNTER — Telehealth: Payer: Self-pay | Admitting: *Deleted

## 2017-08-14 DIAGNOSIS — D0511 Intraductal carcinoma in situ of right breast: Secondary | ICD-10-CM | POA: Diagnosis not present

## 2017-08-14 NOTE — Telephone Encounter (Signed)
-----   Message from [name], MD sent at 08/14/2017  7:10 AM EDT ----- Patient seen at [facility] for second opinion earlier this month. See if she has any questions/ concerns about her surgery site.  Original f/u was not until next spring. Arrange earlier f/u if she is interested or has any questions. Thanks.

## 2017-08-15 DIAGNOSIS — H16002 Unspecified corneal ulcer, left eye: Secondary | ICD-10-CM | POA: Diagnosis not present

## 2017-08-15 NOTE — Telephone Encounter (Signed)
Patient stopped by and is doing well. No problems with surgery site. Seeing [facility] again next week for follow up and will give [place] an update then. She is very appreciative of the concern for her.

## 2017-08-20 ENCOUNTER — Ambulatory Visit: Payer: 59 | Admitting: Oncology

## 2017-08-20 ENCOUNTER — Other Ambulatory Visit: Payer: 59

## 2017-08-21 DIAGNOSIS — D0511 Intraductal carcinoma in situ of right breast: Secondary | ICD-10-CM | POA: Diagnosis not present

## 2017-08-29 DIAGNOSIS — H16002 Unspecified corneal ulcer, left eye: Secondary | ICD-10-CM | POA: Diagnosis not present

## 2017-09-10 DIAGNOSIS — D0511 Intraductal carcinoma in situ of right breast: Secondary | ICD-10-CM | POA: Diagnosis not present

## 2017-09-10 DIAGNOSIS — Z85528 Personal history of other malignant neoplasm of kidney: Secondary | ICD-10-CM | POA: Diagnosis not present

## 2017-09-10 DIAGNOSIS — E785 Hyperlipidemia, unspecified: Secondary | ICD-10-CM | POA: Diagnosis not present

## 2017-09-10 DIAGNOSIS — I10 Essential (primary) hypertension: Secondary | ICD-10-CM | POA: Diagnosis not present

## 2017-09-17 DIAGNOSIS — R87612 Low grade squamous intraepithelial lesion on cytologic smear of cervix (LGSIL): Secondary | ICD-10-CM | POA: Diagnosis not present

## 2017-09-17 DIAGNOSIS — I10 Essential (primary) hypertension: Secondary | ICD-10-CM | POA: Diagnosis not present

## 2017-09-17 DIAGNOSIS — Z8673 Personal history of transient ischemic attack (TIA), and cerebral infarction without residual deficits: Secondary | ICD-10-CM | POA: Diagnosis not present

## 2017-09-17 DIAGNOSIS — Z124 Encounter for screening for malignant neoplasm of cervix: Secondary | ICD-10-CM | POA: Diagnosis not present

## 2017-09-17 DIAGNOSIS — E785 Hyperlipidemia, unspecified: Secondary | ICD-10-CM | POA: Diagnosis not present

## 2017-09-17 DIAGNOSIS — Z85528 Personal history of other malignant neoplasm of kidney: Secondary | ICD-10-CM | POA: Diagnosis not present

## 2017-09-25 DIAGNOSIS — R87629 Unspecified abnormal cytological findings in specimens from vagina: Secondary | ICD-10-CM | POA: Insufficient documentation

## 2017-10-01 DIAGNOSIS — H16002 Unspecified corneal ulcer, left eye: Secondary | ICD-10-CM | POA: Diagnosis not present

## 2017-10-02 DIAGNOSIS — N898 Other specified noninflammatory disorders of vagina: Secondary | ICD-10-CM | POA: Diagnosis not present

## 2017-10-02 DIAGNOSIS — R87622 Low grade squamous intraepithelial lesion on cytologic smear of vagina (LGSIL): Secondary | ICD-10-CM | POA: Diagnosis not present

## 2017-10-31 DIAGNOSIS — Z947 Corneal transplant status: Secondary | ICD-10-CM | POA: Diagnosis not present

## 2017-11-11 ENCOUNTER — Other Ambulatory Visit: Payer: Self-pay

## 2017-11-11 ENCOUNTER — Encounter: Payer: Self-pay | Admitting: Radiation Oncology

## 2017-11-11 ENCOUNTER — Ambulatory Visit
Admission: RE | Admit: 2017-11-11 | Discharge: 2017-11-11 | Disposition: A | Discharge status: Home / Self Care | Payer: 59 | Source: Ambulatory Visit | Attending: Radiation Oncology | Admitting: Radiation Oncology

## 2017-11-11 VITALS — BP 145/83 | HR 76 | Temp 97.2°F | Wt 160.9 lb

## 2017-11-11 DIAGNOSIS — Z170 Estrogen receptor positive status [ER+]: Secondary | ICD-10-CM | POA: Insufficient documentation

## 2017-11-11 DIAGNOSIS — M129 Arthropathy, unspecified: Secondary | ICD-10-CM | POA: Diagnosis not present

## 2017-11-11 DIAGNOSIS — D0511 Intraductal carcinoma in situ of right breast: Secondary | ICD-10-CM | POA: Diagnosis not present

## 2017-11-11 DIAGNOSIS — Z923 Personal history of irradiation: Secondary | ICD-10-CM | POA: Diagnosis not present

## 2017-11-11 DIAGNOSIS — C50111 Malignant neoplasm of central portion of right female breast: Secondary | ICD-10-CM

## 2017-11-11 NOTE — Progress Notes (Signed)
Radiation Oncology Follow up Note  Name: Makayla Rice   Date:   11/11/2017 MRN:  [id number] DOB: [birth year]    This 67 y.o. female presents to the clinic today for5 month follow-up status post whole breast radiation to her right breast for ER/PR positive ductal carcinoma in situ  REFERRING PROVIDER: Adin Hector, MD  HPI: patient is a 67 year old female now seen out 5 months having completed whole breast radiation to her right breast for ER/PR positive ductal carcinoma in situ. Seen today in routine follow-up she is doing fairly well she has a lot of somatic complaints including bony pain most likely secondary to arthritis. She's also stopped taking tamoxifen based on side effect profile. For breast and point she is doing well specifically denies breast tenderness cough or bone pain. Has not yet had a mammogram..  COMPLICATIONS OF TREATMENT: none  FOLLOW UP COMPLIANCE: keeps appointments   PHYSICAL EXAM:  BP (!) 145/83 (BP Location: Left Arm, Patient Position: Sitting)   Pulse 76   Temp (!) 97.2 F (36.2 C) (Tympanic)   Wt 160 lb 15 oz (73 kg)   BMI 27.62 kg/m  Lungs are clear to A&P cardiac examination essentially unremarkable with regular rate and rhythm. No dominant mass or nodularity is noted in either breast in 2 positions examined. Incision is well-healed. No axillary or supraclavicular adenopathy is appreciated. Cosmetic result is excellent.Well-developed well-nourished patient in NAD. HEENT reveals PERLA, EOMI, discs not visualized.  Oral cavity is clear. No oral mucosal lesions are identified. Neck is clear without evidence of cervical or supraclavicular adenopathy. Lungs are clear to A&P. Cardiac examination is essentially unremarkable with regular rate and rhythm without murmur rub or thrill. Abdomen is benign with no organomegaly or masses noted. Motor sensory and DTR levels are equal and symmetric in the upper and lower extremities. Cranial nerves II through XII are grossly  intact. Proprioception is intact. No peripheral adenopathy or edema is identified. No motor or sensory levels are noted. Crude visual fields are within normal range.  RADIOLOGY RESULTS: no current films for review  PLAN: present time patient is doing well. I'm please were overall progress. I will see her back in 6 months and have ordered follow-up mammograms for the next visit. She also see her PMD about her arthritic type complaints. Patient is to call with any concerns.  I would like to take this opportunity to thank you for allowing me to participate in the care of your patient.Noreene Filbert, MD

## 2017-11-12 DIAGNOSIS — M25522 Pain in left elbow: Secondary | ICD-10-CM | POA: Diagnosis not present

## 2017-11-12 DIAGNOSIS — M79605 Pain in left leg: Secondary | ICD-10-CM | POA: Diagnosis not present

## 2017-11-25 DIAGNOSIS — R05 Cough: Secondary | ICD-10-CM | POA: Diagnosis not present

## 2017-11-25 DIAGNOSIS — Z905 Acquired absence of kidney: Secondary | ICD-10-CM | POA: Diagnosis not present

## 2017-11-25 DIAGNOSIS — N200 Calculus of kidney: Secondary | ICD-10-CM | POA: Diagnosis not present

## 2017-11-25 DIAGNOSIS — Z8041 Family history of malignant neoplasm of ovary: Secondary | ICD-10-CM | POA: Diagnosis not present

## 2017-11-25 DIAGNOSIS — N2889 Other specified disorders of kidney and ureter: Secondary | ICD-10-CM | POA: Diagnosis not present

## 2017-11-25 DIAGNOSIS — Z882 Allergy status to sulfonamides status: Secondary | ICD-10-CM | POA: Diagnosis not present

## 2017-11-25 DIAGNOSIS — C50919 Malignant neoplasm of unspecified site of unspecified female breast: Secondary | ICD-10-CM | POA: Diagnosis not present

## 2017-11-25 DIAGNOSIS — C649 Malignant neoplasm of unspecified kidney, except renal pelvis: Secondary | ICD-10-CM | POA: Diagnosis not present

## 2017-11-25 DIAGNOSIS — Z9109 Other allergy status, other than to drugs and biological substances: Secondary | ICD-10-CM | POA: Diagnosis not present

## 2017-11-25 DIAGNOSIS — Z85528 Personal history of other malignant neoplasm of kidney: Secondary | ICD-10-CM | POA: Diagnosis not present

## 2017-11-26 ENCOUNTER — Ambulatory Visit: Payer: 59 | Attending: Internal Medicine

## 2017-11-26 DIAGNOSIS — M79641 Pain in right hand: Secondary | ICD-10-CM | POA: Insufficient documentation

## 2017-11-26 DIAGNOSIS — M79621 Pain in right upper arm: Secondary | ICD-10-CM | POA: Diagnosis not present

## 2017-11-26 DIAGNOSIS — M79631 Pain in right forearm: Secondary | ICD-10-CM | POA: Diagnosis not present

## 2017-11-26 DIAGNOSIS — M5412 Radiculopathy, cervical region: Secondary | ICD-10-CM | POA: Diagnosis not present

## 2017-11-26 NOTE — Therapy (Signed)
[place] [facility] [address], [place], [address] Phone: [phone]   Fax:  [phone]  Physical Therapy Evaluation  Patient Details  Name: Makayla Rice MRN: [id number] Date of Birth: 03-Jan-1951 Referring Provider (PT): Ramonita Lab, MD   Encounter Date: 11/26/2017  PT End of Session - 11/26/17 1301    Visit Number  1    Number of Visits  13    Date for PT Re-Evaluation  01/09/18    PT Start Time  1301    PT Stop Time  1412    PT Time Calculation (min)  71 min    Activity Tolerance  Patient tolerated treatment well    Behavior During Therapy  [facility] for tasks assessed/performed       Past Medical History:  Diagnosis Date  . Arthritis   . Breast cancer ([place]) 11/2016   DCIS  . Cancer [facility]) 2013   kidney cancer  . Complication of anesthesia   . History of kidney stones   . Hyperlipidemia   . Hypertension   . Personal history of radiation therapy 2019   F/u right breast ca  . PONV (postoperative nausea and vomiting)    AFTER ROBOTIC KIDNEY SURGERY  . Stroke [facility] 2009   acute pontine    Past Surgical History:  Procedure Laterality Date  . ABDOMINAL HYSTERECTOMY    . BREAST BIOPSY Right 11/13/2016   Affirm Bx-path DCIS  . BREAST EXCISIONAL BIOPSY Right 03/04/2017   lumpectomy wit np DCIS   . BREAST LUMPECTOMY Right 02/2017   DCIS  . BREAST LUMPECTOMY WITH NEEDLE LOCALIZATION Right 03/04/2017   Procedure: BREAST LUMPECTOMY WITH NEEDLE LOCALIZATION;  Surgeon: [name], MD;  Location: [facility] ORS;  Service: General;  Laterality: Right;  . COLONOSCOPY  2018  . EYE SURGERY  2003  . KIDNEY SURGERY  2013   ROBOTIC SURGERY  . PARTIAL NEPHRECTOMY  2013    There were no vitals filed for this visit.   Subjective Assessment - 11/26/17 1302    Subjective  R arm pain: 7/10 currently, 10/10 at worst for the past 2 weeks (this past weekend, pt was working)    Pertinent History  R arm pain. Pt states  that her R arm bothers her when she brings it up to comb her hair. R arm soreness which goes all the way down to her fingers, mainly digits 3-5 on R hand.  Has been having R arm pain since 2 weeks ago.  Pain originally started in her L arm but then went to her R.  Same pain distribution on L.  Pt states that the pain started 2 weeks ago in both arms but bothered her L UE more.  L arm and forearm pain stopped last week but still has tightness in her L digits 3-5.  Denies neck pain.  Pain in R UE wakes her up at night, pt usually lays flat on her back.  Using a blanket to keep her R arm warmer makes her R UE feel better when it bothers her at night.  Getting up and walking relaxes her R UE when it bothers her at night.  Has not had imaging for her B UE or neck.  B UE pain begain gradually. No known method of injury.  Currently still has cancer.  Only had radiation therapy for R breast CA from March 2019 to April 2019.  She also had sugery to remove a tumor out of her  R breast February 2019.  Goes back to her CA doctor in about 6 months.  Pt is R hand dominant.     Patient Stated Goals  Lifting items at work such as the trash. Get back to normal.     Currently in Pain?  Yes    Pain Score  7     Pain Location  --   arm, forearm and hand   Pain Orientation  Right;Left   R > L   Pain Descriptors / Indicators  Tightness;Tingling   tingling in fingers   Pain Type  Acute pain    Pain Onset  1 to 4 weeks ago    Pain Frequency  Constant    Aggravating Factors   fixing her hair, bringing her R arm up (elbow flexion and shoulder flexion), driving (turning the wheel to the L > to the R), reaching behind her back.     Pain Relieving Factors  getting up and walking helps relax muscles, keeping it warm. Moving around her R arm.          [facility] PT Assessment - 11/26/17 1321      Assessment   Medical Diagnosis  R arm pain    Referring Provider (PT)  Ramonita Lab, MD    Onset Date/Surgical Date  11/25/17   Date PT  referral signed. Pain began about 2 weeks ago.    Hand Dominance  Right    Prior Therapy  No known PT for current condition.       Precautions   Precaution Comments  R breast CA      Prior Function   Vocation  Full time employment    Vocation Requirements  PLOF: better able to comb her hair with her R UE, lift items, drive more comfortably.       Observation/Other Assessments   Observations  (+) empty can. (+) Azzie Glatter.  Reproduction of pain when trying to get into the position for Yocum test R shoulder.     Focus on Therapeutic Outcomes (FOTO)   R upper arm FOTO: 55      Posture/Postural Control   Posture Comments  protracted neck, B protracted shoulders, movement crease around C5/6 area      AROM   Right Shoulder Flexion  94 Degrees   134 AAROM, stiff end feel. Tingling digits 2-5 R hand   Right Shoulder ABduction  93 Degrees   121 AAROM, stiff end feel; reproduced ant/lateral arm pain   Right Shoulder Internal Rotation  --   functional IR: thumb right above bra strap   Right Shoulder External Rotation  --   functional ER: fingers to C7   Left Shoulder Flexion  133 Degrees   159 AAROM   Left Shoulder ABduction  139 Degrees    Left Shoulder Internal Rotation  --   functional IR: index finger to scapula   Left Shoulder External Rotation  --   functional ER: fingers to C7   Cervical Flexion  Full    Cervical Extension  limited    Cervical - Right Side [facility] with symptoims in R fingers mainly digits 2-5    Cervical - Left Side [facility] with reproduction or R arm soreness.     Cervical - Right Rotation  WFL    Cervical - Left Rotation  [facility]      Strength   Right Shoulder Flexion  4/5   at available range with IR compensation   Right Shoulder  ABduction  4/5   with arm pain   Right Shoulder Internal Rotation  4+/5    Right Shoulder External Rotation  4-/5   with R arm and elbow pain   Left Shoulder Flexion  4+/5    Left Shoulder ABduction  4+/5    Left Shoulder  Internal Rotation  5/5    Left Shoulder External Rotation  4+/5    Right Elbow Flexion  4/5    Right Elbow Extension  5/5    Left Elbow Flexion  4/5    Left Elbow Extension  5/5    Right Wrist Extension  4+/5   with R arm pain   Left Wrist Extension  4+/5      Palpation   Palpation comment  B UT muscle tension R > L                Objective measurements completed on examination: See above findings.     Pt states that her blood pressure is controlled   Denies latex band allergies  Reproduction of R arm pain with R cervical side bending      Manual therapy  Seated STM R upper trap muscle   Decreased R hand symptoms. No R arm pain with cervical R side bending afterwards.     Therapeutic exercise   Standing scapular retraction 10x. Difficulty with scapular retraction. Tendency for shoulder shrug and scapular protraction.  Max cues for keeping her shoulder from shrugging.    Patient is a 67 year old female who came to physical therapy secondary to R arm pain > L. She also presents with symptoms going down to her R forearm and hand, limited shoulder flexion and abduction AROM, R shoulder weakness, poor posture, reproduction of symptoms with cervical side bending R > L, positive special tests suggesting R shoulder impingement; R upper trap muscle tightness > L, and difficulty performing functional tasks such as driving, fixing her hair, and carrying items. Patient will benefit from skilled physical therapy services to address the aforementioned deficits.     PT Education - 11/26/17 1904    Education Details  plan of care    Person(s) Educated  Patient    Methods  Explanation    Comprehension  Verbalized understanding       PT Short Term Goals - 11/26/17 1542      PT SHORT TERM GOAL #1   Title  Patient will be independent with her HEP to decrease R UE pain, improve UE strength and function.     Time  3    Period  Weeks    Status  New    Target Date   12/19/17        PT Long Term Goals - 11/26/17 1543      PT LONG TERM GOAL #1   Title  Patient will have a decrease in R UE pain to 5/10 or less at worst to promote ability to lift items, drive, fix her hair with her R UE more comfortably.     Baseline  10/10 R UE pain at worst for the past 2 weeks (11/26/2017)    Time  6    Period  Weeks    Status  New    Target Date  01/09/18      PT LONG TERM GOAL #2   Title  Patient will improve R shoulder ER strength by at least 1/2 MMT grade to promote ability to raise her R arm up, lift items, drive more  comfortably.     Baseline  R shoulder ER 4-/5 (11/26/2017)    Time  6    Period  Weeks    Status  New    Target Date  01/09/18      PT LONG TERM GOAL #3   Title  Patient will improve her upper arm FOTO by at least 10 points as a demonstration of improved function.     Baseline  R upper arm FOTO: 55 (11/26/2017)    Time  6    Period  Weeks    Status  New    Target Date  01/09/18             Plan - 11/26/17 1431    Clinical Impression Statement  Patient is a 67 year old female who came to physical therapy secondary to R arm pain > L. She also presents with symptoms going down to her R forearm and hand, limited shoulder flexion and abduction AROM, R shoulder weakness, poor posture, reproduction of symptoms with cervical side bending R > L, positive special tests suggesting R shoulder impingement; R upper trap muscle tightness > L, and difficulty performing functional tasks such as driving, fixing her hair, and carrying items. Patient will benefit from skilled physical therapy services to address the aforementioned deficits.      History and Personal Factors relevant to plan of care:  age, hx of CA, difficulty lifting, fixing her hair, driving due to R UE pain    Clinical Presentation  Stable    Clinical Presentation due to:  Pain has not gotten better in R UE.     Clinical Decision Making  Low    Rehab Potential  Fair    Clinical  Impairments Affecting Rehab Potential  (-) Age, hx of CA; (+) motivated    PT Frequency  2x / week    PT Duration  6 weeks    PT Treatment/Interventions  Aquatic Therapy;Therapeutic activities;Therapeutic exercise;Neuromuscular re-education;Patient/family education;Manual techniques;Dry needling;Electrical Stimulation    PT Next Visit Plan  scapular and ER muscle strengthening, manual techniques    Consulted and Agree with Plan of Care  Patient       Patient will benefit from skilled therapeutic intervention in order to improve the following deficits and impairments:  Pain, Postural dysfunction, Improper body mechanics, Decreased strength, Decreased range of motion  Visit Diagnosis: Pain in right upper arm - Plan: PT plan of care cert/re-cert  Pain in right forearm - Plan: PT plan of care cert/re-cert  Pain in right hand - Plan: PT plan of care cert/re-cert  Radiculopathy, cervical region - Plan: PT plan of care cert/re-cert     Problem List Patient Active Problem List   Diagnosis Date Noted  . Ductal carcinoma in situ (DCIS) of right breast 11/19/2016  . Hypertension 04/16/2016  . Hyperlipidemia, unspecified 04/16/2016  . Glaucoma 04/16/2016  . History of stroke 07/28/2013  . History of renal cell carcinoma 05/27/2012   Joneen Boers PT, DPT   11/26/2017, 7:11 PM  [place] [facility] [address] [place], [address] Phone: [phone]   Fax:  [phone]  Name: Shavelle Runkel MRN: [id number] Date of Birth: 1950-07-17

## 2017-11-26 NOTE — Therapy (Deleted)
[facility] [address] [place], [address] Phone: [phone]   Fax:  [phone]  Physical Therapy Evaluation  Patient Details  Name: Makayla Rice MRN: [id number] Date of Birth: April 24, 1950 No data recorded  Encounter Date: 11/26/2017  PT End of Session - 11/26/17 1301    Visit Number  1    Number of Visits  13    Date for PT Re-Evaluation  01/09/18    PT Start Time  1301    PT Stop Time  1412    PT Time Calculation (min)  71 min    Activity Tolerance  Patient tolerated treatment well    Behavior During Therapy  [facility] for tasks assessed/performed       Past Medical History:  Diagnosis Date  . Arthritis   . Breast cancer ([facility]) 11/2016   DCIS  . Cancer [facility]) 2013   kidney cancer  . Complication of anesthesia   . History of kidney stones   . Hyperlipidemia   . Hypertension   . Personal history of radiation therapy 2019   F/u right breast ca  . PONV (postoperative nausea and vomiting)    AFTER ROBOTIC KIDNEY SURGERY  . 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 ABDOMINAL HYSTERECTOMY    . BREAST BIOPSY Right 11/13/2016   Affirm Bx-path DCIS  . BREAST EXCISIONAL BIOPSY Right 03/04/2017   lumpectomy wit np DCIS   . BREAST LUMPECTOMY Right 02/2017   DCIS  . BREAST LUMPECTOMY WITH NEEDLE LOCALIZATION Right 03/04/2017   Procedure: BREAST LUMPECTOMY WITH NEEDLE LOCALIZATION;  Surgeon: [name], MD;  Location: [facility] ORS;  Service: General;  Laterality: Right;  . COLONOSCOPY  2018  . EYE SURGERY  2003  . KIDNEY SURGERY  2013   ROBOTIC SURGERY  . PARTIAL NEPHRECTOMY  2013    There were no vitals filed for this visit.   Subjective Assessment - 11/26/17 1302    Subjective  R arm pain: 7/10 currently, 10/10 at worst for the past 2 weeks (this past weekend, pt was working)    Pertinent History  R arm pain. Pt states that her R arm bothers  her when she brings it up to comb her hair. R arm soreness which goes all the way down to her fingers, mainly digits 3-5 on R hand.  Has been having R arm pain since 2 weeks ago.  Pain originally started in her L arm but then went to her R.  Same pain distribution on L.  Pt states that the pain started 2 weeks ago in both arms but bothered her L UE more.  L arm and forearm pain stoped last week but still has tightness in her L digits 3-5.  Denies neck pain.  Pain in R UE wakes her up at night, pt usually lays flat on her back.  Using a blanket to keep her R arm warmer makes her R UE feel better when it bothers her at night.  Getting up and walking relaxes her R UE when it bothers her at night.  Has not had and imaging for her B UE or neck.  B UE pain begain gradually. No known method of injury.  Currently still has cancer.  Only had radiation therapy for R breast CA from March 2019 to April 2019.  She also had sugery to remove a tumor out of her R breast February  2019.  Goes back to her CA doctor in about 6 months.  Pt is R hand dominant.     Patient Stated Goals  Lifting items at work such as the trash. Get back to normal.     Currently in Pain?  Yes    Pain Score  7     Pain Location  --   arm, forearm and hand   Pain Orientation  Right;Left   R > L   Pain Descriptors / Indicators  Tightness;Tingling   tingling in fingers   Pain Type  Acute pain    Pain Onset  1 to 4 weeks ago    Pain Frequency  Constant    Aggravating Factors   fixing her hair, bringing her R arm up (elbow flexion and shoulder flexion), driving (turning the wheel to the L > to the R), reaching behind her back.     Pain Relieving Factors  getting up and walking helps relax muscles, keeping it warm. Moving around her R arm.          [facility] PT Assessment - 11/26/17 1321      Observation/Other Assessments   Observations  (+) empty can. (-) Horn blower test but reproduced R arm pain. (+) Azzie Glatter.  Reproduction of pain when  trying to get into the position for Yocum test R shoulder.       Posture/Postural Control   Posture Comments  protracted neck, B protracted shoulders, movement crease around C5/6 area      AROM   Right Shoulder Flexion  94 Degrees   134 AAROM, stiff end feel. Tingling digits 2-5 R hand   Right Shoulder ABduction  93 Degrees   121 AAROM, stiff end feel; reproduced ant/lateral arm pain   Right Shoulder Internal Rotation  --   functional IR: thumb right above bra strap   Right Shoulder External Rotation  --   functional ER: fingers to C7   Left Shoulder Flexion  133 Degrees   159 AAROM   Left Shoulder ABduction  139 Degrees    Left Shoulder Internal Rotation  --   functional IR: index finger to scapula   Left Shoulder External Rotation  --   functional ER: fingers to C7   Cervical Flexion  Full    Cervical Extension  limited    Cervical - Right Side [facility] with symptoims in R fingers mainly digits 2-5    Cervical - Left Side [facility] with reproduction or R arm soreness.     Cervical - Right Rotation  WFL    Cervical - Left Rotation  [facility]      Strength   Right Shoulder Flexion  4/5   at available range with IR compensation   Right Shoulder ABduction  4/5   with arm pain   Right Shoulder Internal Rotation  4+/5    Right Shoulder External Rotation  4-/5   with R arm and elbow pain   Left Shoulder Flexion  4+/5    Left Shoulder ABduction  4+/5    Left Shoulder Internal Rotation  5/5    Left Shoulder External Rotation  4+/5    Right Elbow Flexion  4/5    Right Elbow Extension  5/5    Left Elbow Flexion  4/5    Left Elbow Extension  5/5    Right Wrist Extension  4+/5   with R arm pain   Left Wrist Extension  4+/5  Palpation   Palpation comment  B UT muscle tension R > L                Objective measurements completed on examination: See above findings.   R arm pain 11/25/17 Ramonita Lab, MD                        Plan - 11/26/17  1431    Clinical Impression Statement  Patient is a 67 year old female who came to physical therapy secondary to R arm pain > L. She also presents with symptoms going down to her R forearm and hand, limited shoulder flexion and abduction AROM, R shoulder weakness, poor posture, reproduction of symptoms with cervical side bending R > L, positive special tests suggesting R shoulder impingement; R upper trap muscle tightness > L, and difficulty performing functional tasks such as driving, fixing her hair, and carrying items. Patient will benefit from skilled physical therapy services to address the aforementioned deficits.      History and Personal Factors relevant to plan of care:  age, hx of CA, difficulty lifting, fixing her hair, driving due to R UE pain    Clinical Presentation  Stable    Clinical Presentation due to:  Pain has not gotten better in R UE.     Clinical Decision Making  Low    Rehab Potential  Fair    Clinical Impairments Affecting Rehab Potential  (-) Age, hx of CA; (+) motivated    PT Frequency  2x / week    PT Duration  6 weeks    PT Treatment/Interventions  Aquatic Therapy;Therapeutic activities;Therapeutic exercise;Neuromuscular re-education;Patient/family education;Manual techniques;Dry needling;Electrical Stimulation    PT Next Visit Plan  scapular and ER muscle strengthening, manual techniques    Consulted and Agree with Plan of Care  Patient       Patient will benefit from skilled therapeutic intervention in order to improve the following deficits and impairments:  Pain, Postural dysfunction, Improper body mechanics, Decreased strength, Decreased range of motion  Visit Diagnosis: No diagnosis found.     Problem List Patient Active Problem List   Diagnosis Date Noted  . Ductal carcinoma in situ (DCIS) of right breast 11/19/2016  . Hypertension 04/16/2016  . Hyperlipidemia, unspecified 04/16/2016  . Glaucoma 04/16/2016  . History of stroke 07/28/2013  . History  of renal cell carcinoma 05/27/2012    Falana Clagg 11/26/2017, 3:42 PM  [facility] [address] [place], [address] Phone: [phone]   Fax:  [phone]  Name: Kaitlin Alcindor MRN: [id number] Date of Birth: 1950-08-11

## 2017-12-03 ENCOUNTER — Ambulatory Visit: Payer: 59

## 2017-12-03 ENCOUNTER — Encounter: Payer: Self-pay | Admitting: Physical Therapy

## 2017-12-03 DIAGNOSIS — M79641 Pain in right hand: Secondary | ICD-10-CM

## 2017-12-03 DIAGNOSIS — M79621 Pain in right upper arm: Secondary | ICD-10-CM | POA: Diagnosis not present

## 2017-12-03 DIAGNOSIS — M79631 Pain in right forearm: Secondary | ICD-10-CM | POA: Diagnosis not present

## 2017-12-03 DIAGNOSIS — M5412 Radiculopathy, cervical region: Secondary | ICD-10-CM

## 2017-12-03 NOTE — Patient Instructions (Signed)
Medbridge Access Code: [email or web address]  Neck Retraction  10x3 with 5 second holds

## 2017-12-03 NOTE — Therapy (Signed)
[facility] [address], [place], [address] Phone: [phone]   Fax:  [phone]  Physical Therapy Treatment  Patient Details  Name: Makayla Rice MRN: [id number] Date of Birth: 11-Mar-1950 Referring Provider (PT): Ramonita Lab, MD   Encounter Date: 12/03/2017  PT End of Session - 12/03/17 1033    Visit Number  2    Number of Visits  13    Date for PT Re-Evaluation  01/09/18    PT Start Time  1034    PT Stop Time  1114    PT Time Calculation (min)  40 min    Activity Tolerance  Patient tolerated treatment well    Behavior During Therapy  [facility] for tasks assessed/performed       Past Medical History:  Diagnosis Date  . Arthritis   . Breast cancer ([place]) 11/2016   DCIS  . Cancer [facility] 2013   kidney cancer  . Complication of anesthesia   . History of kidney stones   . Hyperlipidemia   . Hypertension   . Personal history of radiation therapy 2019   F/u right breast ca  . PONV (postoperative nausea and vomiting)    AFTER ROBOTIC KIDNEY SURGERY  . 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 ABDOMINAL HYSTERECTOMY    . BREAST BIOPSY Right 11/13/2016   Affirm Bx-path DCIS  . BREAST EXCISIONAL BIOPSY Right 03/04/2017   lumpectomy wit np DCIS   . BREAST LUMPECTOMY Right 02/2017   DCIS  . BREAST LUMPECTOMY WITH NEEDLE LOCALIZATION Right 03/04/2017   Procedure: BREAST LUMPECTOMY WITH NEEDLE LOCALIZATION;  Surgeon: [name], MD;  Location: [facility] ORS;  Service: General;  Laterality: Right;  . COLONOSCOPY  2018  . EYE SURGERY  2003  . KIDNEY SURGERY  2013   ROBOTIC SURGERY  . PARTIAL NEPHRECTOMY  2013    There were no vitals filed for this visit.  Subjective Assessment - 12/03/17 1034    Subjective  R arm is ok. Hurting a little bit but its ok. 4/10 currently.     Pertinent History  R arm pain. Pt states that her R arm bothers her when she  brings it up to comb her hair. R arm soreness which goes all the way down to her fingers, mainly digits 3-5 on R hand.  Has been having R arm pain since 2 weeks ago.  Pain originally started in her L arm but then went to her R.  Same pain distribution on L.  Pt states that the pain started 2 weeks ago in both arms but bothered her L UE more.  L arm and forearm pain stopped last week but still has tightness in her L digits 3-5.  Denies neck pain.  Pain in R UE wakes her up at night, pt usually lays flat on her back.  Using a blanket to keep her R arm warmer makes her R UE feel better when it bothers her at night.  Getting up and walking relaxes her R UE when it bothers her at night.  Has not had imaging for her B UE or neck.  B UE pain begain gradually. No known method of injury.  Currently still has cancer.  Only had radiation therapy for R breast CA from March 2019 to April 2019.  She also had sugery to remove a tumor out of her R breast February 2019.  Goes  back to her CA doctor in about 6 months.  Pt is R hand dominant.     Patient Stated Goals  Lifting items at work such as the trash. Get back to normal.     Pain Onset  1 to 4 weeks ago                                PT Education - 12/03/17 1104    Education Details  ther-ex, HEP    Person(s) Educated  Patient    Methods  Demonstration;Explanation;Tactile cues;Verbal cues;Handout    Comprehension  Returned demonstration;Verbalized understanding        Objectives  No latex band allergies   Medbridge Access Code: [email or web address]   Manual therapy  Seated STM R upper trap muscle               Therapeutic exercise   Chin tucks  10x, then 10x5 seconds  Reviewed and given as part of her HEP. Pt demonstrated and verbalized understanding. Handout provided.    Standing scapular retraction 3x, then 10x2. Difficulty with scapular retraction.   Seated manually resisted R scapular retraction 10x5 seconds for 2 sets. R  arm symptoms which eases with rest.   Standing L shoulder extension with scapular retraction (to help decrease R upper trap tension) resisting yellow band 10x2  No R arm pain afterwards   Supine chin tucks 10x5 seconds for 2 sets  Improved exercise technique, movement at target joints, use of target muscles after mod verbal, visual, tactile cues.    Decreased R arm pain with treatment to decrease R upper trap muscle tension as well as improving L scapular strength. No R arm pain after session. Pt also needed consistent cues to decrease B scapular shrug and R cervical side bending.       PT Short Term Goals - 11/26/17 1542      PT SHORT TERM GOAL #1   Title  Patient will be independent with her HEP to decrease R UE pain, improve UE strength and function.     Time  3    Period  Weeks    Status  New    Target Date  12/19/17        PT Long Term Goals - 11/26/17 1543      PT LONG TERM GOAL #1   Title  Patient will have a decrease in R UE pain to 5/10 or less at worst to promote ability to lift items, drive, fix her hair with her R UE more comfortably.     Baseline  10/10 R UE pain at worst for the past 2 weeks (11/26/2017)    Time  6    Period  Weeks    Status  New    Target Date  01/09/18      PT LONG TERM GOAL #2   Title  Patient will improve R shoulder ER strength by at least 1/2 MMT grade to promote ability to raise her R arm up, lift items, drive more comfortably.     Baseline  R shoulder ER 4-/5 (11/26/2017)    Time  6    Period  Weeks    Status  New    Target Date  01/09/18      PT LONG TERM GOAL #3   Title  Patient will improve her upper arm FOTO by at least 10 points as a demonstration of improved function.  Baseline  R upper arm FOTO: 55 (11/26/2017)    Time  6    Period  Weeks    Status  New    Target Date  01/09/18            Plan - 12/03/17 1032    Clinical Impression Statement  Decreased R arm pain with treatment to decrease R upper trap  muscle tension as well as improving L scapular strength. No R arm pain after session. Pt also needed consistent cues to decrease B scapular shrug and R cervical side bending.     Rehab Potential  Fair    Clinical Impairments Affecting Rehab Potential  (-) Age, hx of CA; (+) motivated    PT Frequency  2x / week    PT Duration  6 weeks    PT Treatment/Interventions  Aquatic Therapy;Therapeutic activities;Therapeutic exercise;Neuromuscular re-education;Patient/family education;Manual techniques;Dry needling;Electrical Stimulation    PT Next Visit Plan  scapular and ER muscle strengthening, manual techniques    Consulted and Agree with Plan of Care  Patient       Patient will benefit from skilled therapeutic intervention in order to improve the following deficits and impairments:  Pain, Postural dysfunction, Improper body mechanics, Decreased strength, Decreased range of motion  Visit Diagnosis: Pain in right upper arm  Pain in right forearm  Pain in right hand  Radiculopathy, cervical region     Problem List Patient Active Problem List   Diagnosis Date Noted  . Ductal carcinoma in situ (DCIS) of right breast 11/19/2016  . Hypertension 04/16/2016  . Hyperlipidemia, unspecified 04/16/2016  . Glaucoma 04/16/2016  . History of stroke 07/28/2013  . History of renal cell carcinoma 05/27/2012    Joneen Boers PT, DPT   12/03/2017, 7:47 PM  [facility] [address] [place], [address] Phone: [phone]   Fax:  [phone]  Name: Karlisha Mathena MRN: [id number] Date of Birth: Nov 17, 1950

## 2017-12-09 ENCOUNTER — Encounter: Payer: Self-pay | Admitting: Physician Assistant

## 2017-12-09 ENCOUNTER — Ambulatory Visit (INDEPENDENT_AMBULATORY_CARE_PROVIDER_SITE_OTHER): Payer: Self-pay | Admitting: Physician Assistant

## 2017-12-09 VITALS — BP 140/90 | HR 87 | Temp 97.9°F | Wt 158.0 lb

## 2017-12-09 DIAGNOSIS — R059 Cough, unspecified: Secondary | ICD-10-CM

## 2017-12-09 DIAGNOSIS — R05 Cough: Secondary | ICD-10-CM

## 2017-12-09 DIAGNOSIS — R0982 Postnasal drip: Secondary | ICD-10-CM

## 2017-12-09 MED ORDER — ALBUTEROL SULFATE (2.5 MG/3ML) 0.083% IN NEBU
2.5000 mg | INHALATION_SOLUTION | Freq: Once | RESPIRATORY_TRACT | Status: AC
  Administered 2017-12-09: 2.5 mg via RESPIRATORY_TRACT

## 2017-12-09 MED ORDER — OXYMETAZOLINE HCL 0.05 % NA SOLN: 1.0000 | Freq: Two times a day (BID) | NASAL | 0 refills | Status: AC

## 2017-12-09 MED ORDER — BENZONATATE 100 MG PO CAPS: 100.0000 mg | ORAL_CAPSULE | Freq: Three times a day (TID) | ORAL | 0 refills | Status: AC | PRN

## 2017-12-09 MED ORDER — MONTELUKAST SODIUM 10 MG PO TABS: 10.0000 mg | ORAL_TABLET | Freq: Every day | ORAL | 0 refills | Status: DC

## 2017-12-09 MED ORDER — PSEUDOEPH-BROMPHEN-DM 30-2-10 MG/5ML PO SYRP: 5.0000 mL | ORAL_SOLUTION | Freq: Four times a day (QID) | ORAL | 0 refills | Status: DC | PRN

## 2017-12-09 NOTE — Progress Notes (Signed)
Patient ID: Makayla Rice DOB: [birth year] AGE: 66 y.o. MRN: [id number]   PCP: Adin Hector, MD   Chief Complaint:  Chief Complaint  Patient presents with  . choice-cough x2d     Subjective:    HPI:  Makayla Rice is a 67 y.o. female presents for evaluation  Chief Complaint  Patient presents with  . choice-cough x58d    67 year old female presents to [facility] with one year history of cough. Coarse. Productive. Associated wheezing. States more severe past two days. Reports post-nasal drip and phlegm in posterior pharynx. Cough ok during the day, when patient is up and around and moving. Worse in the evening. Patient using multiple pillows to prop self up at night. Will have coughing fits that result in spitting/vomiting white mucous/sputum. Using previously prescribed albuterol inhaler with minimal relief. Has not used any OTC medication. Denies fever, chills, bodyaches, malaise, ear pain, sinus pain, sore throat, chest pain, SOB. No recent travel, prior to onset of cough. Negative TB survey annually (works for [employer]).  Seen at [facility] on 06/14/2017. Prescribed Tessalon Perles, albuterol inhaler, and Singulair.  Patient seen by her PCP. Dr. Alisa Graff III MD with [facility] for months of cough on 09/17/2017. July 2019 CXR reviewed, negative for lesions or pneumonia/infiltrate. PCP suspected due to cough worse at night, could be due to reflux. Prescribed 2-week Omeprazole trial. Patient states provided no improvement. Patient at that time was also referred to pulmonologist.  Patient seen by her oncologist, Armando Gang NP with [facility] on 11/25/2017. CT scan of abdomen/pelvis revealed no cancer. Patient is 6 years s/p laparosocopic left partial nephrectomy for renal cell carcinoma. Cancer evaluation/follow-up good. Patient mentioned cough since February 2019, was referred to [facility] pulmonologist.  Patient states she has appointment  with [facility] pulmonologist on December 17, 2017 - in 8 days.  A complete, at least 10 system review of symptoms was performed, pertinent positives and negatives as mentioned in HPI; patient currently attending PT for right upper arm/shoulder pain.  The following portions of the patient's history were reviewed and updated as appropriate: allergies, current medications and past medical history.  Patient Active Problem List   Diagnosis Date Noted  . Ductal carcinoma in situ (DCIS) of right breast 11/19/2016  . Hypertension 04/16/2016  . Hyperlipidemia, unspecified 04/16/2016  . Glaucoma 04/16/2016  . History of stroke 07/28/2013  . History of renal cell carcinoma 05/27/2012    Allergies  Allergen Reactions  . Sulfamethoxazole-Trimethoprim Rash  . Ciprofloxacin     Other reaction(s): Headache  . Doxycycline Monohydrate     Other reaction(s): Unknown  . Sulfa Antibiotics Other (See Comments)  . Sulfasalazine Other (See Comments)  . Tetracycline Other (See Comments)  . Tetracyclines & Related     Other reaction(s): Unknown  . Bacitracin Rash  . Metronidazole Rash    Current Outpatient Medications on File Prior to Visit  Medication Sig Dispense Refill  . albuterol (PROVENTIL HFA;VENTOLIN HFA) 108 (90 Base) MCG/ACT inhaler Inhale 2 puffs into the lungs every 6 (six) hours as needed for wheezing or shortness of breath. 1 Inhaler 0  . aspirin EC 81 MG tablet Take 81 mg daily by mouth.     Marland Kitchen azelastine (ASTELIN) 0.1 % nasal spray Place 1 spray into both nostrils daily as needed. For cold symptoms  5  . Calcium Carbonate-Vit D-Min (CALCIUM 600+D3 PLUS MINERALS PO) Take 1 tablet by mouth daily.    . Cholecalciferol (  VITAMIN D3) 2000 UNITS capsule Take 2,000 Units daily by mouth.     Marland Kitchen levobunolol (BETAGAN) 0.5 % ophthalmic solution Place 1 drop into both eyes daily.     Marland Kitchen loratadine (CLARITIN) 10 MG tablet Take 10 mg by mouth daily as needed for allergies.    Marland Kitchen lovastatin (MEVACOR) 40 MG  tablet Take 80 mg by mouth at bedtime.     . metoprolol tartrate (LOPRESSOR) 25 MG tablet Take 25 mg by mouth every morning. TAKE ONE TABLET ONCE DAILY FOR BLOOD PRESSURE    . Multiple Vitamin (MULTIVITAMIN WITH MINERALS) TABS tablet Take 1 tablet by mouth daily. Centrum Silver    . prednisoLONE acetate (PRED FORTE) 1 % ophthalmic suspension Place 1 drop into the left eye daily.    . montelukast (SINGULAIR) 10 MG tablet Take 1 tablet (10 mg total) by mouth at bedtime. 30 tablet 0   No current facility-administered medications on file prior to visit.        Objective:   Vitals:   12/09/17 1257  BP: 140/90  Pulse: 87  Temp: 97.9 F (36.6 C)  SpO2: 98%     Wt Readings from Last 3 Encounters:  12/09/17 158 lb (71.7 kg)  11/11/17 160 lb 15 oz (73 kg)  06/25/17 163 lb 3.2 oz (74 kg)    Physical Exam:   General Appearance:  Alert, cooperative, appears stated age. In no acute distress. Afebrile.  Head:  Normocephalic, without obvious abnormality, atraumatic  Eyes:  PERRL, conjunctiva/corneas clear, EOM's intact, fundi benign, both eyes  Ears:  Normal TM's and external ear canals, both ears  Nose: Nares normal, septum midline. Nasal mucosa reveals bilateral edema and clear rhinorrhea. Left naris with dried blood. No sinus tenderness with percussion/palpation.  Throat: Lips, mucosa, and tongue normal; teeth and gums normal. Throat reveals no erythema. Tonsils with no enlargement or exudate.  Neck: Supple, symmetrical, trachea midline, no adenopathy  Lungs:   Clear to auscultation bilaterally, respirations unlabored. Good aeration. No wheezing. No rales, rhonchi, or crackles. No cough during examination.  Heart:  Regular rate and rhythm, S1 and S2 normal, no murmur, rub, or gallop  Extremities: Extremities normal, atraumatic, no cyanosis or edema  Pulses: 2+ and symmetric  Skin: Skin color, texture, turgor normal, no rashes or lesions  Lymph nodes: Cervical, supraclavicular, and  axillary nodes normal  Neurologic: Normal    Assessment & Plan:    Exam findings, diagnosis etiology and medication use and indications reviewed with patient. Follow-Up and discharge instructions provided. No emergent/urgent issues found on exam.  Patient education was provided.   Patient verbalized understanding of information provided and agrees with plan of care (POC), all questions answered. The patient is advised to call or return to clinic if condition does not see an improvement in symptoms, or to seek the care of the closest emergency department if condition worsens with the below plan.    1. Cough  - albuterol (PROVENTIL) (2.5 MG/3ML) 0.083% nebulizer solution 2.5 mg - montelukast (SINGULAIR) 10 MG tablet; Take 1 tablet (10 mg total) by mouth at bedtime.  Dispense: 30 tablet; Refill: 0 - brompheniramine-pseudoephedrine-DM 30-2-10 MG/5ML syrup; Take 5 mLs by mouth 4 (four) times daily as needed.  Dispense: 120 mL; Refill: 0 - benzonatate (TESSALON PERLES) 100 MG capsule; Take 1 capsule (100 mg total) by mouth 3 (three) times daily as needed for cough.  Dispense: 20 capsule; Refill: 0  2. Post-nasal drip  - oxymetazoline (AFRIN NASAL SPRAY) 0.05 % nasal  spray; Place 1 spray into both nostrils 2 (two) times daily for 4 days.  Dispense: 30 mL; Refill: 0  Albuterol nebulizer treatment performed in office. Patient reported coughing up some phlegm, mild symptom improvement.  Patient with 11 month history of cough. Been evaluated at [facility], by PCP, and by oncologist. Has appointment with [facility] pulmonologist next week. Agreed to prescribe some medication for symptom relief (Tessalon Perles, Bromfed cough syrup, and Singulair); however, advised further evaluation and treatment should be per pulmonologist. Patient agreed with plan.    Darlin Priestly, MHS, PA-C Montey Hora, MHS, PA-C Advanced Practice Provider [facility]  [address] [facility], [place], [place] [address] (p):  [phone] [email or web address] [email or web address]

## 2017-12-09 NOTE — Patient Instructions (Addendum)
Thank you for choosing [facility] for your health care needs today.  You have been diagnosed with a cough.  Recommend you stay hydrated; increase fluids, recommend water. Continue to use albuterol inhaler. May use 2 puffs every 4 hours for cough/wheezing. Use several pillows to prop self up at night. Use humidifier in bedroom.  You have been prescribed Tessalon Perles. May help with cough. You have been prescribed Bromfed cough syrup. May help with cough. You have been prescribed Singulair. Will help with allergies; sometimes related to cough.  You have been prescribed Afrin nasal spray. Use 1 spray in each nostril twice a day x 4 days. (4 days ONLY).  Follow-up with pulmonologist as scheduled.  Cough, Adult A cough helps to clear your throat and lungs. A cough may last only 2-3 weeks (acute), or it may last longer than 8 weeks (chronic). Many different things can cause a cough. A cough may be a sign of an illness or another medical condition. Follow these instructions at home:  Pay attention to any changes in your cough.  Take medicines only as told by your doctor. ? If you were prescribed an antibiotic medicine, take it as told by your doctor. Do not stop taking it even if you start to feel better. ? Talk with your doctor before you try using a cough medicine.  Drink enough fluid to keep your pee (urine) clear or pale yellow.  If the air is dry, use a cold steam vaporizer or humidifier in your home.  Stay away from things that make you cough at work or at home.  If your cough is worse at night, try using extra pillows to raise your head up higher while you sleep.  Do not smoke, and try not to be around smoke. If you need help quitting, ask your doctor.  Do not have caffeine.  Do not drink alcohol.  Rest as needed. Contact a doctor if:  You have new problems (symptoms).  You cough up yellow fluid (pus).  Your cough does not get better after 2-3 weeks, or your cough  gets worse.  Medicine does not help your cough and you are not sleeping well.  You have pain that gets worse or pain that is not helped with medicine.  You have a fever.  You are losing weight and you do not know why.  You have night sweats. Get help right away if:  You cough up blood.  You have trouble breathing.  Your heartbeat is very fast. This information is not intended to replace advice given to you by your health care provider. Make sure you discuss any questions you have with your health care provider. Document Released: 09/07/2010 Document Revised: 06/02/2015 Document Reviewed: 03/03/2014 Elsevier Interactive Patient Education  [employer].

## 2017-12-10 ENCOUNTER — Ambulatory Visit: Payer: 59 | Attending: Internal Medicine

## 2017-12-10 DIAGNOSIS — M5412 Radiculopathy, cervical region: Secondary | ICD-10-CM | POA: Diagnosis not present

## 2017-12-10 DIAGNOSIS — M79621 Pain in right upper arm: Secondary | ICD-10-CM | POA: Insufficient documentation

## 2017-12-10 DIAGNOSIS — M79631 Pain in right forearm: Secondary | ICD-10-CM | POA: Diagnosis not present

## 2017-12-10 DIAGNOSIS — M79641 Pain in right hand: Secondary | ICD-10-CM | POA: Diagnosis not present

## 2017-12-10 NOTE — Therapy (Signed)
[place] [facility] [address] [place], [address] Phone: [phone]   Fax:  [phone]  Physical Therapy Treatment  Patient Details  Name: Makayla Rice MRN: [id number] Date of Birth: [birth year] Referring Provider (PT): Ramonita Lab, MD   Encounter Date: 12/10/2017  PT End of Session - 12/10/17 0806    Visit Number  3    Number of Visits  13    Date for PT Re-Evaluation  01/09/18    PT Start Time  0806    PT Stop Time  0847    PT Time Calculation (min)  41 min    Activity Tolerance  Patient tolerated treatment well    Behavior During Therapy  [facility] for tasks assessed/performed       Past Medical History:  Diagnosis Date  . Arthritis   . 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 Cancer [facility]) 2013   kidney cancer  . Complication of anesthesia   . History of kidney stones   . Hyperlipidemia   . Hypertension   . Personal history of radiation therapy 2019   F/u right breast ca  . PONV (postoperative nausea and vomiting)    AFTER ROBOTIC KIDNEY SURGERY  . 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 ABDOMINAL HYSTERECTOMY    . BREAST BIOPSY Right 11/13/2016   Affirm Bx-path DCIS  . BREAST EXCISIONAL BIOPSY Right 03/04/2017   lumpectomy wit np DCIS   . BREAST LUMPECTOMY Right 02/2017   DCIS  . BREAST LUMPECTOMY WITH NEEDLE LOCALIZATION Right 03/04/2017   Procedure: BREAST LUMPECTOMY WITH NEEDLE LOCALIZATION;  Surgeon: [name], MD;  Location: [facility] ORS;  Service: General;  Laterality: Right;  . COLONOSCOPY  2018  . EYE SURGERY  2003  . KIDNEY SURGERY  2013   ROBOTIC SURGERY  . PARTIAL NEPHRECTOMY  2013    There were no vitals filed for this visit.  Subjective Assessment - 12/10/17 0807    Subjective  Forgot to take her blood pressure pill yesterday. R arm bothered her a lot last night. Did not get to do her exercises because she had to work  Thanksgiving weekend.  No pain in her R arm currently since she got up and moving around.     Pertinent History  R arm pain. Pt states that her R arm bothers her when she brings it up to comb her hair. R arm soreness which goes all the way down to her fingers, mainly digits 3-5 on R hand.  Has been having R arm pain since 2 weeks ago.  Pain originally started in her L arm but then went to her R.  Same pain distribution on L.  Pt states that the pain started 2 weeks ago in both arms but bothered her L UE more.  L arm and forearm pain stopped last week but still has tightness in her L digits 3-5.  Denies neck pain.  Pain in R UE wakes her up at night, pt usually lays flat on her back.  Using a blanket to keep her R arm warmer makes her R UE feel better when it bothers her at night.  Getting up and walking relaxes her R UE when it bothers her at night.  Has not had imaging for her B UE or neck.  B UE pain begain gradually. No known method of injury.  Currently still has cancer.  Only had radiation therapy for R breast CA from March 2019 to April 2019.  She also had sugery to remove a tumor out of her R breast February 2019.  Goes back to her CA doctor in about 6 months.  Pt is R hand dominant.     Patient Stated Goals  Lifting items at work such as the trash. Get back to normal.     Currently in Pain?  No/denies    Pain Score  0-No pain    Pain Onset  1 to 4 weeks ago                               PT Education - 12/10/17 1835    Education Details  ther-ex    [employer]) Educated  Patient    Methods  Explanation;Demonstration;Tactile cues;Verbal cues    Comprehension  Returned demonstration;Verbalized understanding        Objectives  No latex band allergies   Medbridge Access Code: [email or web address]   Manual therapy  Seated STM R upper trap muscle     Therapeutic exercise  Blood pressure, L arm sitting, mechanically taken, normal cuff: 137/75, HR 86  (taken secondary to pt stating she forgot to take her blood pressure medication)    Seated manually resisted R scapular retraction 10x5 seconds for 3 sets. No R arm symptoms today   Chin tucks             10x5 seconds               Standing scapular retraction 10x3 with 5 second holds. Difficulty with scapular retraction but improved compared to last session.    Standing L shoulder extension with scapular retraction (to help decrease R upper trap tension) resisting yellow band 10x3  R shoulder ER resisting yellow band 10x3  R shoulder IR resisting yellow band 10x3               Seated manually resisted R scapular depression with PT 10x5 seconds   Improved exercise technique, movement at target joints, use of target muscles after mod verbal, visual, tactile cues.   Pt able to come to session without complain of starting pain in her R arm. Continued working on decreasing R upper trap/rhomboid muscle tension, improving upper thoracic extension, scapular retraction to decrease extension pressure to lower cervical spine. Pt tolerated session well without aggravation of symptoms. Pt will benefit from continued skilled physical therapy services to decrease pain, improve strength and function.      PT Short Term Goals - 11/26/17 1542      PT SHORT TERM GOAL #1   Title  Patient will be independent with her HEP to decrease R UE pain, improve UE strength and function.     Time  3    Period  Weeks    Status  New    Target Date  12/19/17        PT Long Term Goals - 11/26/17 1543      PT LONG TERM GOAL #1   Title  Patient will have a decrease in R UE pain to 5/10 or less at worst to promote ability to lift items, drive, fix her hair with her R UE more comfortably.     Baseline  10/10 R UE pain at worst for the past 2 weeks (11/26/2017)    Time  6    Period  Weeks    Status  New  Target Date  01/09/18      PT LONG TERM GOAL #2   Title  Patient will improve R shoulder ER  strength by at least 1/2 MMT grade to promote ability to raise her R arm up, lift items, drive more comfortably.     Baseline  R shoulder ER 4-/5 (11/26/2017)    Time  6    Period  Weeks    Status  New    Target Date  01/09/18      PT LONG TERM GOAL #3   Title  Patient will improve her upper arm FOTO by at least 10 points as a demonstration of improved function.     Baseline  R upper arm FOTO: 55 (11/26/2017)    Time  6    Period  Weeks    Status  New    Target Date  01/09/18            Plan - 12/10/17 0810    Clinical Impression Statement  Pt able to come to session without complain of starting pain in her R arm. Continued working on decreasing R upper trap/rhomboid muscle tension, improving upper thoracic extension, scapular retraction to decrease extension pressure to lower cervical spine. Pt tolerated session well without aggravation of symptoms. Pt will benefit from continued skilled physical therapy services to decrease pain, improve strength and function.     Rehab Potential  Fair    Clinical Impairments Affecting Rehab Potential  (-) Age, hx of CA; (+) motivated    PT Frequency  2x / week    PT Duration  6 weeks    PT Treatment/Interventions  Aquatic Therapy;Therapeutic activities;Therapeutic exercise;Neuromuscular re-education;Patient/family education;Manual techniques;Dry needling;Electrical Stimulation    PT Next Visit Plan  scapular and ER muscle strengthening, manual techniques    Consulted and Agree with Plan of Care  Patient       Patient will benefit from skilled therapeutic intervention in order to improve the following deficits and impairments:  Pain, Postural dysfunction, Improper body mechanics, Decreased strength, Decreased range of motion  Visit Diagnosis: Pain in right upper arm  Pain in right forearm  Pain in right hand  Radiculopathy, cervical region     Problem List Patient Active Problem List   Diagnosis Date Noted  . Ductal carcinoma in  situ (DCIS) of right breast 11/19/2016  . Hypertension 04/16/2016  . Hyperlipidemia, unspecified 04/16/2016  . Glaucoma 04/16/2016  . History of stroke 07/28/2013  . History of renal cell carcinoma 05/27/2012    Joneen Boers PT, DPT   12/10/2017, 6:40 PM  [place] [facility] [address], [place], [address] Phone: [phone]   Fax:  [phone]  Name: Makayla Rice MRN: [id number] Date of Birth: 04/14/1950

## 2017-12-12 ENCOUNTER — Telehealth: Payer: Self-pay | Admitting: Emergency Medicine

## 2017-12-12 NOTE — Telephone Encounter (Signed)
Spoke with patient whom stated that she is doing well.

## 2017-12-17 ENCOUNTER — Ambulatory Visit: Payer: 59 | Admitting: Physical Therapy

## 2017-12-17 DIAGNOSIS — R05 Cough: Secondary | ICD-10-CM | POA: Diagnosis not present

## 2017-12-19 ENCOUNTER — Ambulatory Visit: Payer: 59 | Admitting: Physical Therapy

## 2017-12-19 ENCOUNTER — Encounter: Payer: Self-pay | Admitting: Physical Therapy

## 2017-12-19 DIAGNOSIS — M79621 Pain in right upper arm: Secondary | ICD-10-CM

## 2017-12-19 DIAGNOSIS — M79641 Pain in right hand: Secondary | ICD-10-CM

## 2017-12-19 DIAGNOSIS — M79631 Pain in right forearm: Secondary | ICD-10-CM

## 2017-12-19 DIAGNOSIS — M5412 Radiculopathy, cervical region: Secondary | ICD-10-CM

## 2017-12-19 NOTE — Therapy (Signed)
[place] [facility] [address], [place], [address] Phone: [phone]   Fax:  [phone]  Physical Therapy Treatment  Patient Details  Name: Makayla Rice MRN: [id number] Date of Birth: 1950-05-21 Referring Provider (PT): Ramonita Lab, MD   Encounter Date: 12/19/2017  PT End of Session - 12/19/17 1904    Visit Number  4    Number of Visits  13    Date for PT Re-Evaluation  01/09/18    PT Start Time  0630    PT Stop Time  1845    PT Time Calculation (min)  50 min    Activity Tolerance  Patient tolerated treatment well;Patient limited by fatigue;Patient limited by pain    Behavior During Therapy  [facility] for tasks assessed/performed       Past Medical History:  Diagnosis Date  . Arthritis   . 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 Cancer [facility]) 2013   kidney cancer  . Complication of anesthesia   . History of kidney stones   . Hyperlipidemia   . Hypertension   . Personal history of radiation therapy 2019   F/u right breast ca  . PONV (postoperative nausea and vomiting)    AFTER ROBOTIC KIDNEY SURGERY  . 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 ABDOMINAL HYSTERECTOMY    . BREAST BIOPSY Right 11/13/2016   Affirm Bx-path DCIS  . BREAST EXCISIONAL BIOPSY Right 03/04/2017   lumpectomy wit np DCIS   . BREAST LUMPECTOMY Right 02/2017   DCIS  . BREAST LUMPECTOMY WITH NEEDLE LOCALIZATION Right 03/04/2017   Procedure: BREAST LUMPECTOMY WITH NEEDLE LOCALIZATION;  Surgeon: [name], MD;  Location: [facility] ORS;  Service: General;  Laterality: Right;  . COLONOSCOPY  2018  . EYE SURGERY  2003  . KIDNEY SURGERY  2013   ROBOTIC SURGERY  . PARTIAL NEPHRECTOMY  2013    There were no vitals filed for this visit.  Subjective Assessment - 12/19/17 1756    Subjective  Patient reports no pain upon arrival. She states she only has pain at night (reports 10/10 last  nights 2x). The pain is in her right arm. She is pretty tired today after not sleeping. She must return to work after her appointment and will stay until 11pm.  She has no work restrictions and will have worked over her usual shift today because they are short handed.  Has not been doing HEP due to being too busy. She felt good following last visit. She went to get a pulmonary test on monday but she has not gotten any results back left. She will see pulmonologist next month.     Pertinent History  R arm pain. Pt states that her R arm bothers her when she brings it up to comb her hair. R arm soreness which goes all the way down to her fingers, mainly digits 3-5 on R hand.  Has been having R arm pain since 2 weeks ago.  Pain originally started in her L arm but then went to her R.  Same pain distribution on L.  Pt states that the pain started 2 weeks ago in both arms but bothered her L UE more.  L arm and forearm pain stopped last week but still has tightness in her L digits 3-5.  Denies neck pain.  Pain in R UE wakes her up at night, pt usually lays  flat on her back.  Using a blanket to keep her R arm warmer makes her R UE feel better when it bothers her at night.  Getting up and walking relaxes her R UE when it bothers her at night.  Has not had imaging for her B UE or neck.  B UE pain begain gradually. No known method of injury.  Currently still has cancer.  Only had radiation therapy for R breast CA from March 2019 to April 2019.  She also had sugery to remove a tumor out of her R breast February 2019.  Goes back to her CA doctor in about 6 months.  Pt is R hand dominant.     Patient Stated Goals  Lifting items at work such as the trash. Get back to normal.     Currently in Pain?  No/denies    Pain Onset  1 to 4 weeks ago         Objective   No latex band allergies Positive right active ULTT for ulnar and radial bias.    MedbridgeAccess Code: [email or web address]   Manual therapy: to reduce pain and  tissue tension, improve range of motion, neuromodulation, in order to promote improved ability to complete functional activities.  Seated and supine STM R upper trap muscle, no reproduction of symptoms.   Supine PROM right shoulder flexion x 2 but discontinued due to complaints of right upper arm concordant pain that resolved with rest.   Therapeutic exercise: to centralize symptoms and improve ROM and strength required for successful completion of functional activities.   Attempted supine AAROM flexion with PVC pipe but discontinued when it reproduced arm symptoms each rep.    Seated cervical retraction with max cuing, moved to chair that supports back to prevent excessive trunk motion. Required multimodal cuing with moderate improvement in technique and fair at best carry over. 2x10 at start and finish of session to reinforce HEP.   Chin tucks - supine  10x5 seconds with max cuing and self-palpation of SCM to keep it quiet to promote activation of deep cervical flexors.   Standing rows with scapular retraction10x3. Started with yellow theraband first set and progressed to green theraband for 2nd two sets.   Standing L shoulder extension with scapular retraction (to help decrease R upper trap tension) resisting yellow band anchored overhead 10x3  R shoulder ER resisting yellow band 10x3  R shoulder IR resisting yellow band 10x3  Seated lat pull down with isolated scapular depression/elevation with intensive cuing and improvement in motor control and technique as a result, x 10 after learning technique, 10# with wide grip.  Seated pulleys in scaption x 20 to improve right shoulder ROM and motion tolerance. Cuing for appropriate discomfort and AAROM.   Improved exercise technique, movement at target joints, use of target muscles after max verbal, visual, tactile cues.  Pt arrived pain free but continues to complain of being awoken at night with right arm pain. She  tolerated treatment session well and was able to complete all exercises with no lasting increase in pain or discomfort. Continued working on decreasing R upper trap/rhomboid muscle tension, improving upper thoracic extension, scapular retraction to decrease extension pressure to lower cervical spine. Patient had great difficulty performing several exercises due to lack of ability to isolate certain motions and difficulty with coordination of movement that improved with cuing. Pt unable to reproduce HEP exercise independently. She was able to progress several strengthening exercises successfully and able to perform AAROM pulleys through full  right shoulder ROM without increase in pain. Pt will benefit from continued skilled physical therapy services to decrease pain, improve strength and function. Patient is making progress towards goals at this point.   PT Short Term Goals - 12/19/17 1906      PT SHORT TERM GOAL #1   Title  Patient will be independent with her HEP to decrease R UE pain, improve UE strength and function.     Baseline  patinet reports not performing HEP and is unable to reproduce it in clinic, requires futher instruction at this point (12/19/2017)    Time  2    Period  Weeks    Status  On-going    Target Date  01/02/18        PT Long Term Goals - 11/26/17 1543      PT LONG TERM GOAL #1   Title  Patient will have a decrease in R UE pain to 5/10 or less at worst to promote ability to lift items, drive, fix her hair with her R UE more comfortably.     Baseline  10/10 R UE pain at worst for the past 2 weeks (11/26/2017)    Time  6    Period  Weeks    Status  New    Target Date  01/09/18      PT LONG TERM GOAL #2   Title  Patient will improve R shoulder ER strength by at least 1/2 MMT grade to promote ability to raise her R arm up, lift items, drive more comfortably.     Baseline  R shoulder ER 4-/5 (11/26/2017)    Time  6    Period  Weeks    Status  New    Target Date   01/09/18      PT LONG TERM GOAL #3   Title  Patient will improve her upper arm FOTO by at least 10 points as a demonstration of improved function.     Baseline  R upper arm FOTO: 55 (11/26/2017)    Time  6    Period  Weeks    Status  New    Target Date  01/09/18            Plan - 12/19/17 1905    Clinical Impression Statement  Pt arrived pain free but continues to complain of being awoken at night with right arm pain. She tolerated treatment session well and was able to complete all exercises with no lasting increase in pain or discomfort. Continued working on decreasing R upper trap/rhomboid muscle tension, improving upper thoracic extension, scapular retraction to decrease extension pressure to lower cervical spine. Patient had great difficulty performing several exercises due to lack of ability to isolate certain motions and difficulty with coordination of movement that improved with cuing. Pt unable to reproduce HEP exercise independently. She was able to progress several strengthening exercises successfully and able to perform AAROM pulleys through full right shoulder ROM without increase in pain. Pt will benefit from continued skilled physical therapy services to decrease pain, improve strength and function. Patient is making progress towards goals at this point.     Rehab Potential  Fair    Clinical Impairments Affecting Rehab Potential  (-) Age, hx of CA; (+) motivated    PT Frequency  2x / week    PT Duration  6 weeks    PT Treatment/Interventions  Aquatic Therapy;Therapeutic activities;Therapeutic exercise;Neuromuscular re-education;Patient/family education;Manual techniques;Dry needling;Electrical Stimulation    PT Next Visit Plan  scapular and ER muscle strengthening, manual techniques. Progressive cervical, postural, and shoulder girdle strengthening progressing to functional strengthening as tolerated.     PT Home Exercise Plan  MedbridgeAccess Code: [email or web address]    Consulted and  Agree with Plan of Care  Patient       Patient will benefit from skilled therapeutic intervention in order to improve the following deficits and impairments:  Pain, Postural dysfunction, Improper body mechanics, Decreased strength, Decreased range of motion  Visit Diagnosis: Pain in right upper arm  Pain in right forearm  Pain in right hand  Radiculopathy, cervical region     Problem List Patient Active Problem List   Diagnosis Date Noted  . Ductal carcinoma in situ (DCIS) of right breast 11/19/2016  . Hypertension 04/16/2016  . Hyperlipidemia, unspecified 04/16/2016  . Glaucoma 04/16/2016  . History of stroke 07/28/2013  . History of renal cell carcinoma 05/27/2012    Nancy Nordmann, PT, DPT 12/19/2017, 7:08 PM  [place] [facility] [address], [place], [address] Phone: [phone]   Fax:  [phone]  Name: Makayla Rice MRN: [id number] Date of Birth: 05-Nov-1950

## 2017-12-24 ENCOUNTER — Ambulatory Visit: Payer: 59

## 2017-12-24 ENCOUNTER — Other Ambulatory Visit: Payer: Self-pay

## 2017-12-24 ENCOUNTER — Ambulatory Visit: Payer: Self-pay | Admitting: Oncology

## 2017-12-24 DIAGNOSIS — M5412 Radiculopathy, cervical region: Secondary | ICD-10-CM

## 2017-12-24 DIAGNOSIS — M79641 Pain in right hand: Secondary | ICD-10-CM

## 2017-12-24 DIAGNOSIS — M79631 Pain in right forearm: Secondary | ICD-10-CM | POA: Diagnosis not present

## 2017-12-24 DIAGNOSIS — M79621 Pain in right upper arm: Secondary | ICD-10-CM

## 2017-12-24 NOTE — Therapy (Signed)
[place] [facility] [address] [place], [address] Phone: [phone]   Fax:  [phone]  Physical Therapy Treatment  Patient Details  Name: Makayla Rice MRN: [id number] Date of Birth: 1950-06-10 Referring Provider (PT): Ramonita Lab, MD   Encounter Date: 12/24/2017  PT End of Session - 12/24/17 1549    Visit Number  5    Number of Visits  13    Date for PT Re-Evaluation  01/09/18    PT Start Time  0737    PT Stop Time  1629    PT Time Calculation (min)  40 min    Activity Tolerance  Patient tolerated treatment well;Patient limited by fatigue;Patient limited by pain    Behavior During Therapy  [facility] for tasks assessed/performed       Past Medical History:  Diagnosis Date  . Arthritis   . 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 Cancer [facility]) 2013   kidney cancer  . Complication of anesthesia   . History of kidney stones   . Hyperlipidemia   . Hypertension   . Personal history of radiation therapy 2019   F/u right breast ca  . PONV (postoperative nausea and vomiting)    AFTER ROBOTIC KIDNEY SURGERY  . 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 ABDOMINAL HYSTERECTOMY    . BREAST BIOPSY Right 11/13/2016   Affirm Bx-path DCIS  . BREAST EXCISIONAL BIOPSY Right 03/04/2017   lumpectomy wit np DCIS   . BREAST LUMPECTOMY Right 02/2017   DCIS  . BREAST LUMPECTOMY WITH NEEDLE LOCALIZATION Right 03/04/2017   Procedure: BREAST LUMPECTOMY WITH NEEDLE LOCALIZATION;  Surgeon: [name], MD;  Location: [facility] ORS;  Service: General;  Laterality: Right;  . COLONOSCOPY  2018  . EYE SURGERY  2003  . KIDNEY SURGERY  2013   ROBOTIC SURGERY  . PARTIAL NEPHRECTOMY  2013    There were no vitals filed for this visit.  Subjective Assessment - 12/24/17 1551    Subjective  R arm is ok, no pain right now. Just a little pain yesterday sleeping.  R arm did not bother her  Sunday night. Fixing her hair and donning a shirt is better.  Has not been doing her HEP.  Pt states that she feels ready to graduate PT after 2 more sessions.     Pertinent History  R arm pain. Pt states that her R arm bothers her when she brings it up to comb her hair. R arm soreness which goes all the way down to her fingers, mainly digits 3-5 on R hand.  Has been having R arm pain since 2 weeks ago.  Pain originally started in her L arm but then went to her R.  Same pain distribution on L.  Pt states that the pain started 2 weeks ago in both arms but bothered her L UE more.  L arm and forearm pain stopped last week but still has tightness in her L digits 3-5.  Denies neck pain.  Pain in R UE wakes her up at night, pt usually lays flat on her back.  Using a blanket to keep her R arm warmer makes her R UE feel better when it bothers her at night.  Getting up and walking relaxes her R UE when it bothers her at night.  Has not had imaging for her B UE or neck.  B UE  pain begain gradually. No known method of injury.  Currently still has cancer.  Only had radiation therapy for R breast CA from March 2019 to April 2019.  She also had sugery to remove a tumor out of her R breast February 2019.  Goes back to her CA doctor in about 6 months.  Pt is R hand dominant.     Patient Stated Goals  Lifting items at work such as the trash. Get back to normal.     Currently in Pain?  No/denies    Pain Score  0-No pain    Pain Onset  1 to 4 weeks ago         [facility] PT Assessment - 12/24/17 1623      AROM   Right Shoulder Flexion  105 Degrees    Right Shoulder ABduction  104 Degrees                           PT Education - 12/24/17 1605    Education Details  ther-ex, reinforcement of HEP    Person(s) Educated  Patient    Methods  Explanation;Demonstration;Tactile cues;Verbal cues    Comprehension  Returned demonstration;Verbalized understanding      Objectives  No latex band  allergies   MedbridgeAccess Code: [email or web address]   Manual therapy  Seated STM R upper trap muscle     Therapeutic exercise   Chin tucks  10x5 seconds for 2 sets  Seated manually resisted R scapular retraction 10x5 seconds for 3 sets. No R arm symptoms today   seated manually resisted R scapular depression isometrics at neutral 10x5 seconds for 3 sets  R shoulder ER resisting yellow band 10x3  R shoulder IR resisting yellow band 10x3  Standing L shoulder extension with scapular retraction (to help decrease R upper trap tension) resisting yellow band 10x3  Standing L shoulder flexion and abduction AROM 1x each way  Towel slides at wall   R flexion 10x2  R scaption 10x2   Improved exercise technique, movement at target joints, use of target muscles after mod verbal, visual, tactile cues.   Improving function and decreasing overall pain based on subjective reports. Continued working on decreasing R upper trap muscle tension, improving scapular strength and R shoulder ROM to promote function and decrease pain.         PT Short Term Goals - 12/19/17 1906      PT SHORT TERM GOAL #1   Title  Patient will be independent with her HEP to decrease R UE pain, improve UE strength and function.     Baseline  patinet reports not performing HEP and is unable to reproduce it in clinic, requires futher instruction at this point (12/19/2017)    Time  2    Period  Weeks    Status  On-going    Target Date  01/02/18        PT Long Term Goals - 11/26/17 1543      PT LONG TERM GOAL #1   Title  Patient will have a decrease in R UE pain to 5/10 or less at worst to promote ability to lift items, drive, fix her hair with her R UE more comfortably.     Baseline  10/10 R UE pain at worst for the past 2 weeks (11/26/2017)    Time  6    Period  Weeks    Status  New    Target Date  01/09/18  PT LONG TERM GOAL #2   Title  Patient will improve R  shoulder ER strength by at least 1/2 MMT grade to promote ability to raise her R arm up, lift items, drive more comfortably.     Baseline  R shoulder ER 4-/5 (11/26/2017)    Time  6    Period  Weeks    Status  New    Target Date  01/09/18      PT LONG TERM GOAL #3   Title  Patient will improve her upper arm FOTO by at least 10 points as a demonstration of improved function.     Baseline  R upper arm FOTO: 55 (11/26/2017)    Time  6    Period  Weeks    Status  New    Target Date  01/09/18            Plan - 12/24/17 1611    Clinical Impression Statement  Improving function and decreasing overall pain based on subjective reports. Continued working on decreasing R upper trap muscle tension, improving scapular strength and R shoulder ROM to promote function and decrease pain.     Rehab Potential  Fair    Clinical Impairments Affecting Rehab Potential  (-) Age, hx of CA; (+) motivated    PT Frequency  2x / week    PT Duration  6 weeks    PT Treatment/Interventions  Aquatic Therapy;Therapeutic activities;Therapeutic exercise;Neuromuscular re-education;Patient/family education;Manual techniques;Dry needling;Electrical Stimulation    PT Next Visit Plan  scapular and ER muscle strengthening, manual techniques. Progressive cervical, postural, and shoulder girdle strengthening progressing to functional strengthening as tolerated.     PT Home Exercise Plan  MedbridgeAccess Code: [email or web address]    Consulted and Agree with Plan of Care  Patient       Patient will benefit from skilled therapeutic intervention in order to improve the following deficits and impairments:  Pain, Postural dysfunction, Improper body mechanics, Decreased strength, Decreased range of motion  Visit Diagnosis: Pain in right upper arm  Pain in right forearm  Pain in right hand  Radiculopathy, cervical region     Problem List Patient Active Problem List   Diagnosis Date Noted  . Ductal carcinoma in situ (DCIS) of  right breast 11/19/2016  . Hypertension 04/16/2016  . Hyperlipidemia, unspecified 04/16/2016  . Glaucoma 04/16/2016  . History of stroke 07/28/2013  . History of renal cell carcinoma 05/27/2012    Joneen Boers PT, DPT   12/24/2017, 4:35 PM  [place] [facility] [address] [place], [address] Phone: [phone]   Fax:  [phone]  Name: Makayla Rice MRN: [id number] Date of Birth: [birth year]

## 2017-12-27 DIAGNOSIS — L821 Other seborrheic keratosis: Secondary | ICD-10-CM | POA: Diagnosis not present

## 2017-12-27 DIAGNOSIS — L811 Chloasma: Secondary | ICD-10-CM | POA: Diagnosis not present

## 2017-12-31 ENCOUNTER — Ambulatory Visit: Payer: 59

## 2018-01-02 ENCOUNTER — Ambulatory Visit: Payer: 59 | Admitting: Physical Therapy

## 2018-01-07 ENCOUNTER — Ambulatory Visit: Payer: 59

## 2018-01-07 DIAGNOSIS — M79631 Pain in right forearm: Secondary | ICD-10-CM | POA: Diagnosis not present

## 2018-01-07 DIAGNOSIS — M79621 Pain in right upper arm: Secondary | ICD-10-CM | POA: Diagnosis not present

## 2018-01-07 DIAGNOSIS — M5412 Radiculopathy, cervical region: Secondary | ICD-10-CM | POA: Diagnosis not present

## 2018-01-07 DIAGNOSIS — M79641 Pain in right hand: Secondary | ICD-10-CM

## 2018-01-07 NOTE — Therapy (Signed)
[place] [facility] [address], [place], [address] Phone: [phone]   Fax:  [phone]  Physical Therapy Treatment And Discharge Summary  Patient Details  Name: Makayla Rice MRN: [id number] Date of Birth: 1950-04-01 Referring Provider (PT): Ramonita Lab, MD   Encounter Date: 01/07/2018  PT End of Session - 01/07/18 1121    Visit Number  6    Number of Visits  13    Date for PT Re-Evaluation  01/09/18    PT Start Time  1121    PT Stop Time  1206    PT Time Calculation (min)  45 min    Activity Tolerance  Patient tolerated treatment well;Patient limited by fatigue;Patient limited by pain    Behavior During Therapy  [facility] for tasks assessed/performed       Past Medical History:  Diagnosis Date  . Arthritis   . 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 Cancer [facility]) 2013   kidney cancer  . Complication of anesthesia   . History of kidney stones   . Hyperlipidemia   . Hypertension   . Personal history of radiation therapy 2019   F/u right breast ca  . PONV (postoperative nausea and vomiting)    AFTER ROBOTIC KIDNEY SURGERY  . 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 ABDOMINAL HYSTERECTOMY    . BREAST BIOPSY Right 11/13/2016   Affirm Bx-path DCIS  . BREAST EXCISIONAL BIOPSY Right 03/04/2017   lumpectomy wit np DCIS   . BREAST LUMPECTOMY Right 02/2017   DCIS  . BREAST LUMPECTOMY WITH NEEDLE LOCALIZATION Right 03/04/2017   Procedure: BREAST LUMPECTOMY WITH NEEDLE LOCALIZATION;  Surgeon: [name], MD;  Location: [facility] ORS;  Service: General;  Laterality: Right;  . COLONOSCOPY  2018  . EYE SURGERY  2003  . KIDNEY SURGERY  2013   ROBOTIC SURGERY  . PARTIAL NEPHRECTOMY  2013    There were no vitals filed for this visit.  Subjective Assessment - 01/07/18 1122    Subjective  Pt states wanting to graduate today due to insurance reasons. Has not  been doing her HEP. Too much work. No pain currently.  5/10 R arm pain at most for the past 7 days.     Pertinent History  R arm pain. Pt states that her R arm bothers her when she brings it up to comb her hair. R arm soreness which goes all the way down to her fingers, mainly digits 3-5 on R hand.  Has been having R arm pain since 2 weeks ago.  Pain originally started in her L arm but then went to her R.  Same pain distribution on L.  Pt states that the pain started 2 weeks ago in both arms but bothered her L UE more.  L arm and forearm pain stopped last week but still has tightness in her L digits 3-5.  Denies neck pain.  Pain in R UE wakes her up at night, pt usually lays flat on her back.  Using a blanket to keep her R arm warmer makes her R UE feel better when it bothers her at night.  Getting up and walking relaxes her R UE when it bothers her at night.  Has not had imaging for her B UE or neck.  B UE pain begain gradually. No known method of injury.  Currently still has cancer.  Only had  radiation therapy for R breast CA from March 2019 to April 2019.  She also had sugery to remove a tumor out of her R breast February 2019.  Goes back to her CA doctor in about 6 months.  Pt is R hand dominant.     Patient Stated Goals  Lifting items at work such as the trash. Get back to normal.     Currently in Pain?  No/denies    Pain Score  0-No pain    Pain Onset  1 to 4 weeks ago         [facility] PT Assessment - 01/07/18 1202      Observation/Other Assessments   Focus on Therapeutic Outcomes (FOTO)   FOTO: 67      AROM   Right Shoulder Flexion  125 Degrees    Right Shoulder ABduction  122 Degrees   scaption                          PT Education - 01/07/18 1137    Education Details  ther-ex    Person(s) Educated  Patient    Methods  Explanation;Demonstration;Tactile cues;Verbal cues    Comprehension  Returned demonstration;Verbalized understanding      Objectives  No latex  band allergies   MedbridgeAccess Code: [email or web address]   Manual therapy   Seated STM R upper trap muscle   Seated STM R infraspinatus muscle  Decreased R lateral arm discomfort.    Therapeutic exercise  Manually resisted R shoulder ER 1x  4-/5  Chin tucks 10x5 seconds for 2 sets  Seated manually resisted R scapular retraction 10x5 seconds for3sets. NoR arm symptoms today  seated manually resisted R scapular depression isometrics at neutral 10x5 seconds for 3 sets  R shoulder ER resisting yellow band 10x3  R shoulder IR resisting yellow band 10x3   Towel slides at wall              R flexion 10x2             R scaption 10x2   R shoulder AROM  Flexion: 125 degrees  Scaption: 122 degrees    Improved exercise technique, movement at target joints, use of target muscles after mod verbal, visual, tactile cues.    Pt demonstrates overall decreased R arm pain, improved R shoulder flexion and scaption AROM, and improved ability to perform functional tasks based on her FOTO scores. Pt has made good progress with physical therapy towards goals. Skilled physical therapy services discharged secondary to good progress and pt request due to insurance related reasons.      PT Short Term Goals - 01/07/18 1142      PT SHORT TERM GOAL #1   Title  Patient will be independent with her HEP to decrease R UE pain, improve UE strength and function.     Baseline  patinet reports not performing HEP and is unable to reproduce it in clinic, requires futher instruction at this point (12/19/2017); Has not been performing HEP (01/07/2018)    Time  2    Period  Weeks    Status  Not Met    Target Date  01/02/18        PT Long Term Goals - 01/07/18 1144      PT LONG TERM GOAL #1   Title  Patient will have a decrease in R UE pain to 5/10 or less at worst to promote ability to lift items, drive, fix her hair  with her R UE more comfortably.      Baseline  10/10 R UE pain at worst for the past 2 weeks (11/26/2017); 5/10 (01/07/2018)    Time  6    Period  Weeks    Status  Achieved    Target Date  01/09/18      PT LONG TERM GOAL #2   Title  Patient will improve R shoulder ER strength by at least 1/2 MMT grade to promote ability to raise her R arm up, lift items, drive more comfortably.     Baseline  R shoulder ER 4-/5 (11/26/2017); (01/07/2018)    Time  6    Period  Weeks    Status  On-going    Target Date  01/09/18      PT LONG TERM GOAL #3   Title  Patient will improve her upper arm FOTO by at least 10 points as a demonstration of improved function.     Baseline  R upper arm FOTO: 55 (11/26/2017); 67 (01/07/2018)    Time  6    Period  Weeks    Status  Achieved    Target Date  01/09/18            Plan - 01/07/18 1138    Clinical Impression Statement  Pt demonstrates overall decreased R arm pain, improved R shoulder flexion and scaption AROM, and improved ability to perform functional tasks based on her FOTO scores. Pt has made good progress with physical therapy towards goals. Skilled physical therapy services discharged secondary to good progress and pt request due to insurance related reasons.     History and Personal Factors relevant to plan of care:  age, hx of CA    Clinical Presentation  Stable    Clinical Presentation due to:  pt made good progress with PT towards goals    Clinical Decision Making  Low    Rehab Potential  Fair    Clinical Impairments Affecting Rehab Potential  (-) Age, hx of CA; (+) motivated    PT Frequency  --    PT Duration  --    PT Treatment/Interventions  Therapeutic activities;Therapeutic exercise;Neuromuscular re-education;Patient/family education;Manual techniques    PT Next Visit Plan  --    PT Home Exercise Plan  --    Consulted and Agree with Plan of Care  Patient       Patient will benefit from skilled therapeutic intervention in order to improve the following deficits and  impairments:  Pain, Postural dysfunction, Improper body mechanics, Decreased strength, Decreased range of motion  Visit Diagnosis: Pain in right upper arm  Pain in right forearm  Pain in right hand  Radiculopathy, cervical region     Problem List Patient Active Problem List   Diagnosis Date Noted  . Ductal carcinoma in situ (DCIS) of right breast 11/19/2016  . Hypertension 04/16/2016  . Hyperlipidemia, unspecified 04/16/2016  . Glaucoma 04/16/2016  . History of stroke 07/28/2013  . History of renal cell carcinoma 05/27/2012    Thank you for your referral.  Joneen Boers PT, DPT   01/07/2018, 12:25 PM  [place] [facility] [address] [place], [address] Phone: [phone]   Fax:  [phone]  Name: Makayla Rice MRN: [id number] Date of Birth: [birth year]

## 2018-01-17 DIAGNOSIS — L309 Dermatitis, unspecified: Secondary | ICD-10-CM | POA: Diagnosis not present

## 2018-01-30 DIAGNOSIS — Z947 Corneal transplant status: Secondary | ICD-10-CM | POA: Diagnosis not present

## 2018-02-03 DIAGNOSIS — R05 Cough: Secondary | ICD-10-CM | POA: Diagnosis not present

## 2018-02-04 ENCOUNTER — Encounter: Payer: Self-pay | Admitting: Physician Assistant

## 2018-02-04 ENCOUNTER — Ambulatory Visit (INDEPENDENT_AMBULATORY_CARE_PROVIDER_SITE_OTHER): Payer: Self-pay | Admitting: Physician Assistant

## 2018-02-04 VITALS — BP 158/100 | HR 80 | Temp 98.3°F | Resp 16 | Ht 64.0 in | Wt 160.0 lb

## 2018-02-04 DIAGNOSIS — R300 Dysuria: Secondary | ICD-10-CM

## 2018-02-04 DIAGNOSIS — R350 Frequency of micturition: Secondary | ICD-10-CM

## 2018-02-04 LAB — POCT URINALYSIS DIPSTICK
BILIRUBIN UA: NEGATIVE
Blood, UA: NEGATIVE
Glucose, UA: NEGATIVE
KETONES UA: NEGATIVE
Leukocytes, UA: NEGATIVE
NITRITE UA: NEGATIVE
Protein, UA: NEGATIVE
SPEC GRAV UA: 1.015 (ref 1.010–1.025)
Urobilinogen, UA: 0.2 E.U./dL
pH, UA: 5 (ref 5.0–8.0)

## 2018-02-04 MED ORDER — CEPHALEXIN 500 MG PO CAPS: 500.0000 mg | ORAL_CAPSULE | Freq: Two times a day (BID) | ORAL | 0 refills | Status: AC

## 2018-02-04 MED ORDER — OXYBUTYNIN CHLORIDE ER 10 MG PO TB24: 10.0000 mg | ORAL_TABLET | Freq: Every day | ORAL | 0 refills | Status: DC

## 2018-02-04 NOTE — Progress Notes (Signed)
Patient ID: Makayla Rice DOB: 1950/06/04 AGE: 68 y.o. MRN: [id number]   PCP: Adin Hector, MD   Chief Complaint:  Chief Complaint  Patient presents with  . Increased urinary frequency    1wk     Subjective:    HPI:  Makayla Rice is a 68 y.o. female presents for evaluation  Chief Complaint  Patient presents with  . Increased urinary frequency    63wk    68 year old female presents to [facility] with one week history of increased urinary frequency. Began as mild. Worsened. Then improved. Patient states she previously averaged urinating every 1-1/2 to 2 hours. When urinary frequency was severe, patient urinating every 30 minutes. States she urinates a fair amount every time she urinates. Associated foul odor. Worse with drinking coffee. Typically no nocturia; when having urinary frequency, would get up once or twice in the night to urinate. Denies fever, chills, headache, back ache, abdominal pain, nausea/vomiting, vaginal rash/discharge/pruritis, dysuria, gross hematuria, urinary urgency, incontinence. Patient denies concern for STD; new sexual partner ~6 months ago; not sexual intercourse past few months.  Patient with five children. All vaginal deliveries. Underwent hysterectomy in 1996. Reports suprapubic/vaginal pressure. Denies visible bladder or rectal prolapse; even with valsalva maneuver when having a bowel movement. Patient underwent pelvic exam on 10/02/2017; no prolapse at that time.  Patient with renal cell carcinoma. Managed by Dr. Armando Gang MD with [facility]. Last seen on 11/25/2017. Patient is six years s/p treatment for her RCC w/ robotic-assisted laparoscopic left partial nephrectomy. Completed radiation in April 2019. Patient on annual follow-up schedule.  Patient also with a history of breast cancer; ductal carcinoma in situ grad 2, strongly ER PR positive. Managed by Lilyan Punt ANP with [facility]. Last seen 08/21/2017. Underwent biopsy,  segmental mastectomy, and radiation. Was prescribed Arimidex, could not tolerate due to side effects.  A limited review of symptoms was performed, pertinent positives and negatives as mentioned in HPI.  The following portions of the patient's history were reviewed and updated as appropriate: allergies, current medications and past medical history.  Patient Active Problem List   Diagnosis Date Noted  . Abnormal vaginal Pap smear 09/25/2017  . Ductal carcinoma in situ (DCIS) of right breast 11/19/2016  . Hypertension 04/16/2016  . Hyperlipidemia, unspecified 04/16/2016  . Glaucoma 04/16/2016  . History of stroke 07/28/2013  . History of renal cell carcinoma 05/27/2012    Allergies  Allergen Reactions  . Sulfamethoxazole-Trimethoprim Rash  . Ciprofloxacin     Other reaction(s): Headache  . Doxycycline Monohydrate     Other reaction(s): Unknown  . Sulfa Antibiotics Other (See Comments)  . Sulfasalazine Other (See Comments)  . Tetracycline Other (See Comments)  . Tetracyclines & Related     Other reaction(s): Unknown  . Bacitracin Rash  . Metronidazole Rash    Current Outpatient Medications on File Prior to Visit  Medication Sig Dispense Refill  . albuterol (PROVENTIL HFA;VENTOLIN HFA) 108 (90 Base) MCG/ACT inhaler Inhale 2 puffs into the lungs every 6 (six) hours as needed for wheezing or shortness of breath. 1 Inhaler 0  . aspirin EC 81 MG tablet Take 81 mg daily by mouth.     Marland Kitchen azelastine (ASTELIN) 0.1 % nasal spray Place 1 spray into both nostrils daily as needed. For cold symptoms  5  . Calcium Carbonate-Vit D-Min (CALCIUM 600+D3 PLUS MINERALS PO) Take 1 tablet by mouth daily.    . Cholecalciferol (VITAMIN D3) 2000 UNITS capsule Take 2,000 Units  daily by mouth.     . fluticasone (FLONASE) 50 MCG/ACT nasal spray 1 spray by Each Nare route daily.    Marland Kitchen levobunolol (BETAGAN) 0.5 % ophthalmic solution Place 1 drop into both eyes daily.     Marland Kitchen loratadine (CLARITIN) 10 MG tablet  Take 10 mg by mouth daily as needed for allergies.    Marland Kitchen lovastatin (MEVACOR) 40 MG tablet Take 80 mg by mouth at bedtime.     . metoprolol tartrate (LOPRESSOR) 25 MG tablet Take 25 mg by mouth every morning. TAKE ONE TABLET ONCE DAILY FOR BLOOD PRESSURE    . montelukast (SINGULAIR) 10 MG tablet Take 1 tablet (10 mg total) by mouth at bedtime. 30 tablet 0  . Multiple Vitamin (MULTIVITAMIN WITH MINERALS) TABS tablet Take 1 tablet by mouth daily. Centrum Silver    . prednisoLONE acetate (PRED FORTE) 1 % ophthalmic suspension Place 1 drop into the left eye daily.    Marland Kitchen triamcinolone cream (KENALOG) 0.1 % APPLY TOPICALLY TWO TIMES DAILY AS NEEDED FOR ITCHING.    . benzonatate (TESSALON PERLES) 100 MG capsule Take 1 capsule (100 mg total) by mouth 3 (three) times daily as needed for cough. (Patient not taking: Reported on 02/04/2018) 20 capsule 0   No current facility-administered medications on file prior to visit.        Objective:   Vitals:   02/04/18 0928  BP: (!) 158/100  Pulse: 80  Resp: 16  Temp: 98.3 F (36.8 C)  SpO2: 98%     Wt Readings from Last 3 Encounters:  02/04/18 160 lb (72.6 kg)  12/09/17 158 lb (71.7 kg)  11/11/17 160 lb 15 oz (73 kg)    Physical Exam:   General Appearance:  Patient sitting comfortably on examination table. Conversational. Kermit Balo self-historian. In no acute distress. Afebrile.   Head:  Normocephalic, without obvious abnormality, atraumatic  Lungs:   Clear to auscultation bilaterally, respirations unlabored  Heart:  Regular rate and rhythm, S1 and S2 normal, no murmur, rub, or gallop  Abdomen:   Normal to inspection. Normoactive bowel sounds. No tenderness with palpation. No suprapubic tenderness. Suprapubic palpation does not elicit need to urinate. Negative CVA tenderness with percussion bilaterally.  Extremities: Extremities normal, atraumatic, no cyanosis or edema  Pulses: 2+ and symmetric  Skin: Skin color, texture, turgor normal, no rashes or  lesions  Lymph nodes: Cervical, supraclavicular, and axillary nodes normal  Neurologic: Normal    Assessment & Plan:    Exam findings, diagnosis etiology and medication use and indications reviewed with patient. Follow-Up and discharge instructions provided. No emergent/urgent issues found on exam.  Patient education was provided.   Patient verbalized understanding of information provided and agrees with plan of care (POC), all questions answered. The patient is advised to call or return to clinic if condition does not see an improvement in symptoms, or to seek the care of the closest emergency department if condition worsens with the below plan.   Orders Placed This Encounter  Procedures  . POCT Urinalysis Dipstick    1. Increased urinary frequency  - POCT Urinalysis Dipstick  Urinalysis performed in office today revealed negative blood, negative ketones, negative protein, negative nitrites, negative glucose, negative leukocytes. S.G of 1.015.  Patient with one week history of increased urinary frequency. Waxes and wanes in severity. No associated urinary urgency/incontinence, dysuria, gross hematuria. Normal UA. Will treat patient for possible UTI and with Oxybutnin for symptom relief of possible overactive bladder. Discussed with patient importance of following  up with PCP, Ob/Gyn, or urgent care in 4-5 days if symptoms have not resolved, due to needing a C&S. Also, discussed with patient recommendation for non-urgent re-evaluation by Ob/Gyn for possible cystocele/bladder prolapse. Patient agreed with plan.   Darlin Priestly, MHS, PA-C Montey Hora, MHS, PA-C Advanced Practice Provider [facility]  [address] [facility], [place], [place] [address] (p):  [phone] [email or web address]

## 2018-02-04 NOTE — Patient Instructions (Addendum)
Thank you for choosing [facility] for your health care needs.  You have been complaining of increased urinary frequency.  At this time will treat for possible UTI (urinary tract infection) and overactive bladder. Meds ordered this encounter  Medications  . cephALEXin (KEFLEX) 500 MG capsule    Sig: Take 1 capsule (500 mg total) by mouth 2 (two) times daily for 5 days.    Dispense:  10 capsule    Refill:  0    Order Specific Question:   Supervising Provider    Answer:   MILLER, BRIAN [[id number]]  . oxybutynin (DITROPAN XL) 10 MG 24 hr tablet    Sig: Take 1 tablet (10 mg total) by mouth at bedtime.    Dispense:  7 tablet    Refill:  0    Order Specific Question:   Supervising Provider    Answer:   Sabra Heck, BRIAN [[id number]]    Increase fluids; water or cranberry juice. Avoid caffeine (coffee, green tea, soda) or carbonated beverages. Empty bladder frequently.  Follow-up with your family physician or Ob/Gyn for further evaluation of possible prolapsed bladder. If you do not feel better in 4-5 days, go to your family physician, Ob/Gyn, or urgent care for a urine culture.   Urinary Frequency, Adult Urinary frequency means urinating more often than usual. You may urinate every 1-2 hours even though you drink a normal amount of fluid and do not have a bladder infection or condition. Although you urinate more often than normal, the total amount of urine produced in a day is normal. With urinary frequency, you may have an urgent need to urinate often. The stress and anxiety of needing to find a bathroom quickly can make this urge worse. This condition may go away on its own or you may need treatment at home. Home treatment may include bladder training, exercises, taking medicines, or making changes to your diet. Follow these instructions at home: Bladder health   Keep a bladder diary if told by your health care provider. Keep track of: ? What you eat and drink. ? How often you urinate. ? How much  you urinate.  Follow a bladder training program if told by your health care provider. This may include: ? Learning to delay going to the bathroom. ? Double urinating (voiding). This helps if you are not completely emptying your bladder. ? Scheduled voiding.  Do Kegel exercises as told by your health care provider. Kegel exercises strengthen the muscles that help control urination, which may help the condition. Eating and drinking  If told by your health care provider, make diet changes, such as: ? Avoiding caffeine. ? Drinking fewer fluids, especially alcohol. ? Not drinking in the evening. ? Avoiding foods or drinks that may irritate the bladder. These include coffee, tea, soda, artificial sweeteners, citrus, tomato-based foods, and chocolate. ? Eating foods that help prevent or ease constipation. Constipation can make this condition worse. Your health care provider may recommend that you:  Drink enough fluid to keep your urine pale yellow.  Take over-the-counter or prescription medicines.  Eat foods that are high in fiber, such as beans, whole grains, and fresh fruits and vegetables.  Limit foods that are high in fat and processed sugars, such as fried or sweet foods. General instructions  Take over-the-counter and prescription medicines only as told by your health care provider.  Keep all follow-up visits as told by your health care provider. This is important. Contact a health care provider if:  You start urinating more  often.  You feel pain or irritation when you urinate.  You notice blood in your urine.  Your urine looks cloudy.  You develop a fever.  You begin vomiting. Get help right away if:  You are unable to urinate. Summary  Urinary frequency means urinating more often than usual. With urinary frequency, you may urinate every 1-2 hours even though you drink a normal amount of fluid and do not have a bladder infection or other bladder condition.  Your  health care provider may recommend that you keep a bladder diary, follow a bladder training program, or make dietary changes.  If told by your health care provider, do Kegel exercises to strengthen the muscles that help control urination.  Take over-the-counter and prescription medicines only as told by your health care provider.  Contact a health care provider if your symptoms do not improve or get worse. This information is not intended to replace advice given to you by your health care provider. Make sure you discuss any questions you have with your health care provider. Document Released: 10/21/2008 Document Revised: 07/04/2017 Document Reviewed: 07/04/2017 Elsevier Interactive Patient Education  2019 [employer].

## 2018-02-07 ENCOUNTER — Telehealth: Payer: Self-pay | Admitting: Emergency Medicine

## 2018-02-07 NOTE — Telephone Encounter (Signed)
Spoke with patient whom informed me that she is doing so much better. This was a follow up call from [facility] visit.

## 2018-02-14 DIAGNOSIS — C649 Malignant neoplasm of unspecified kidney, except renal pelvis: Secondary | ICD-10-CM | POA: Diagnosis not present

## 2018-02-14 DIAGNOSIS — C50919 Malignant neoplasm of unspecified site of unspecified female breast: Secondary | ICD-10-CM | POA: Diagnosis not present

## 2018-02-14 DIAGNOSIS — Z8041 Family history of malignant neoplasm of ovary: Secondary | ICD-10-CM | POA: Diagnosis not present

## 2018-02-18 DIAGNOSIS — Z170 Estrogen receptor positive status [ER+]: Secondary | ICD-10-CM | POA: Diagnosis not present

## 2018-02-18 DIAGNOSIS — R922 Inconclusive mammogram: Secondary | ICD-10-CM | POA: Diagnosis not present

## 2018-02-18 DIAGNOSIS — D0511 Intraductal carcinoma in situ of right breast: Secondary | ICD-10-CM | POA: Diagnosis not present

## 2018-02-18 DIAGNOSIS — Z853 Personal history of malignant neoplasm of breast: Secondary | ICD-10-CM | POA: Diagnosis not present

## 2018-02-18 DIAGNOSIS — C50911 Malignant neoplasm of unspecified site of right female breast: Secondary | ICD-10-CM | POA: Diagnosis not present

## 2018-03-11 DIAGNOSIS — E785 Hyperlipidemia, unspecified: Secondary | ICD-10-CM | POA: Diagnosis not present

## 2018-03-11 DIAGNOSIS — Z85528 Personal history of other malignant neoplasm of kidney: Secondary | ICD-10-CM | POA: Diagnosis not present

## 2018-03-11 DIAGNOSIS — I10 Essential (primary) hypertension: Secondary | ICD-10-CM | POA: Diagnosis not present

## 2018-03-18 DIAGNOSIS — I10 Essential (primary) hypertension: Secondary | ICD-10-CM | POA: Diagnosis not present

## 2018-03-18 DIAGNOSIS — Z23 Encounter for immunization: Secondary | ICD-10-CM | POA: Diagnosis not present

## 2018-03-18 DIAGNOSIS — E785 Hyperlipidemia, unspecified: Secondary | ICD-10-CM | POA: Diagnosis not present

## 2018-03-18 DIAGNOSIS — Z8673 Personal history of transient ischemic attack (TIA), and cerebral infarction without residual deficits: Secondary | ICD-10-CM | POA: Diagnosis not present

## 2018-03-18 DIAGNOSIS — D0511 Intraductal carcinoma in situ of right breast: Secondary | ICD-10-CM | POA: Diagnosis not present

## 2018-05-07 ENCOUNTER — Encounter: Payer: Self-pay | Admitting: *Deleted

## 2018-05-26 ENCOUNTER — Ambulatory Visit: Payer: Self-pay | Admitting: Radiation Oncology

## 2018-07-15 DIAGNOSIS — H401131 Primary open-angle glaucoma, bilateral, mild stage: Secondary | ICD-10-CM | POA: Diagnosis not present

## 2018-07-22 DIAGNOSIS — H401131 Primary open-angle glaucoma, bilateral, mild stage: Secondary | ICD-10-CM | POA: Diagnosis not present

## 2018-08-12 ENCOUNTER — Other Ambulatory Visit: Payer: Self-pay

## 2018-08-13 ENCOUNTER — Encounter: Payer: Self-pay | Admitting: Radiation Oncology

## 2018-08-13 ENCOUNTER — Ambulatory Visit
Admission: RE | Admit: 2018-08-13 | Discharge: 2018-08-13 | Disposition: A | Discharge status: Home / Self Care | Payer: 59 | Source: Ambulatory Visit | Attending: Radiation Oncology | Admitting: Radiation Oncology

## 2018-08-13 ENCOUNTER — Other Ambulatory Visit: Payer: Self-pay

## 2018-08-13 VITALS — BP 179/92 | HR 74 | Temp 96.8°F | Resp 18 | Wt 161.4 lb

## 2018-08-13 DIAGNOSIS — Z86000 Personal history of in-situ neoplasm of breast: Secondary | ICD-10-CM | POA: Insufficient documentation

## 2018-08-13 DIAGNOSIS — C50111 Malignant neoplasm of central portion of right female breast: Secondary | ICD-10-CM

## 2018-08-13 DIAGNOSIS — Z170 Estrogen receptor positive status [ER+]: Secondary | ICD-10-CM

## 2018-08-13 DIAGNOSIS — Z923 Personal history of irradiation: Secondary | ICD-10-CM | POA: Insufficient documentation

## 2018-08-13 NOTE — Progress Notes (Signed)
Radiation Oncology Follow up Note  Name: Makayla Rice   Date:   08/13/2018 MRN:  [id number] DOB: [birth year]    This 68 y.o. female presents to the clinic today for 40-month follow-up status post whole breast radiation to her right breast for ER PR positive ductal carcinoma site.  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  She specifically denies breast tenderness cough or bone pain.  She has been having her mammograms at [facility] have requested them for my review her last one was fine BI-RADS 2..  She is currently under the management at [facility] she is not on an antiestrogen therapy but does not know why I have asked her to address that with her doctors there.  COMPLICATIONS OF TREATMENT: none  FOLLOW UP COMPLIANCE: keeps appointments   PHYSICAL EXAM:  BP (!) 179/92   Pulse 74   Temp (!) 96.8 F (36 C)   Resp 18   Wt 161 lb 6.4 oz (73.2 kg)   BMI 27.70 kg/m  Lungs are clear to A&P cardiac examination essentially unremarkable with regular rate and rhythm. No dominant mass or nodularity is noted in either breast in 2 positions examined. Incision is well-healed. No axillary or supraclavicular adenopathy is appreciated. Cosmetic result is excellent.  Well-developed well-nourished patient in NAD. HEENT reveals PERLA, EOMI, discs not visualized.  Oral cavity is clear. No oral mucosal lesions are identified. Neck is clear without evidence of cervical or supraclavicular adenopathy. Lungs are clear to A&P. Cardiac examination is essentially unremarkable with regular rate and rhythm without murmur rub or thrill. Abdomen is benign with no organomegaly or masses noted. Motor sensory and DTR levels are equal and symmetric in the upper and lower extremities. Cranial nerves II through  XII are grossly intact. Proprioception is intact. No peripheral adenopathy or edema is identified. No motor or sensory levels are noted. Crude visual fields are within normal range.  RADIOLOGY RESULTS: Mammograms from 1 year prior as well as mammogram report from [facility] both reviewed and compatible with above-stated findings showing benign findings.  PLAN: Present time she continues to do well with no evidence of disease.  I am pleased with her overall progress.  I have asked to see her back in 1 year for follow-up.  She continues both her mammograms and medical oncology follow-up at [facility].  Patient knows to call with any concerns.  I would like to take this opportunity to thank you for allowing me to participate in the care of your patient.Noreene Filbert, MD

## 2018-09-01 ENCOUNTER — Other Ambulatory Visit: Payer: Self-pay

## 2018-09-16 DIAGNOSIS — E785 Hyperlipidemia, unspecified: Secondary | ICD-10-CM | POA: Diagnosis not present

## 2018-09-16 DIAGNOSIS — I10 Essential (primary) hypertension: Secondary | ICD-10-CM | POA: Diagnosis not present

## 2018-09-23 DIAGNOSIS — M8588 Other specified disorders of bone density and structure, other site: Secondary | ICD-10-CM | POA: Diagnosis not present

## 2018-09-23 DIAGNOSIS — Z8673 Personal history of transient ischemic attack (TIA), and cerebral infarction without residual deficits: Secondary | ICD-10-CM | POA: Diagnosis not present

## 2018-09-23 DIAGNOSIS — Z124 Encounter for screening for malignant neoplasm of cervix: Secondary | ICD-10-CM | POA: Diagnosis not present

## 2018-09-23 DIAGNOSIS — R1084 Generalized abdominal pain: Secondary | ICD-10-CM | POA: Diagnosis not present

## 2018-09-23 DIAGNOSIS — I10 Essential (primary) hypertension: Secondary | ICD-10-CM | POA: Diagnosis not present

## 2018-09-23 DIAGNOSIS — Z0000 Encounter for general adult medical examination without abnormal findings: Secondary | ICD-10-CM | POA: Diagnosis not present

## 2018-09-23 DIAGNOSIS — E785 Hyperlipidemia, unspecified: Secondary | ICD-10-CM | POA: Diagnosis not present

## 2018-09-23 DIAGNOSIS — R87612 Low grade squamous intraepithelial lesion on cytologic smear of cervix (LGSIL): Secondary | ICD-10-CM | POA: Diagnosis not present

## 2018-10-23 DIAGNOSIS — N890 Mild vaginal dysplasia: Secondary | ICD-10-CM | POA: Diagnosis not present

## 2018-10-28 DIAGNOSIS — Z8673 Personal history of transient ischemic attack (TIA), and cerebral infarction without residual deficits: Secondary | ICD-10-CM | POA: Diagnosis not present

## 2018-10-28 DIAGNOSIS — Z85528 Personal history of other malignant neoplasm of kidney: Secondary | ICD-10-CM | POA: Diagnosis not present

## 2018-10-28 DIAGNOSIS — I10 Essential (primary) hypertension: Secondary | ICD-10-CM | POA: Diagnosis not present

## 2018-10-28 DIAGNOSIS — E785 Hyperlipidemia, unspecified: Secondary | ICD-10-CM | POA: Diagnosis not present

## 2018-11-04 DIAGNOSIS — H5712 Ocular pain, left eye: Secondary | ICD-10-CM | POA: Diagnosis not present

## 2018-11-18 DIAGNOSIS — Z947 Corneal transplant status: Secondary | ICD-10-CM | POA: Diagnosis not present

## 2019-01-06 DIAGNOSIS — R0982 Postnasal drip: Secondary | ICD-10-CM | POA: Diagnosis not present

## 2019-01-06 DIAGNOSIS — J3489 Other specified disorders of nose and nasal sinuses: Secondary | ICD-10-CM | POA: Diagnosis not present

## 2019-01-16 ENCOUNTER — Ambulatory Visit: Payer: 59 | Attending: Internal Medicine

## 2019-01-16 DIAGNOSIS — Z20822 Contact with and (suspected) exposure to covid-19: Secondary | ICD-10-CM | POA: Diagnosis not present

## 2019-01-18 ENCOUNTER — Telehealth: Payer: Self-pay | Admitting: Hematology

## 2019-01-18 LAB — NOVEL CORONAVIRUS, NAA: SARS-CoV-2, NAA: NOT DETECTED

## 2019-01-18 NOTE — Telephone Encounter (Signed)
Pt is aware covid 19 test is neg on 01/18/2019 

## 2019-01-20 DIAGNOSIS — Z947 Corneal transplant status: Secondary | ICD-10-CM | POA: Diagnosis not present

## 2019-02-07 DIAGNOSIS — Z8673 Personal history of transient ischemic attack (TIA), and cerebral infarction without residual deficits: Secondary | ICD-10-CM | POA: Diagnosis not present

## 2019-02-07 DIAGNOSIS — M25441 Effusion, right hand: Secondary | ICD-10-CM | POA: Diagnosis not present

## 2019-02-24 DIAGNOSIS — R922 Inconclusive mammogram: Secondary | ICD-10-CM | POA: Diagnosis not present

## 2019-02-24 DIAGNOSIS — Z170 Estrogen receptor positive status [ER+]: Secondary | ICD-10-CM | POA: Diagnosis not present

## 2019-02-24 DIAGNOSIS — Z905 Acquired absence of kidney: Secondary | ICD-10-CM | POA: Diagnosis not present

## 2019-02-24 DIAGNOSIS — Z8041 Family history of malignant neoplasm of ovary: Secondary | ICD-10-CM | POA: Diagnosis not present

## 2019-02-24 DIAGNOSIS — Z853 Personal history of malignant neoplasm of breast: Secondary | ICD-10-CM | POA: Diagnosis not present

## 2019-02-24 DIAGNOSIS — H4010X0 Unspecified open-angle glaucoma, stage unspecified: Secondary | ICD-10-CM | POA: Diagnosis not present

## 2019-02-24 DIAGNOSIS — Z85528 Personal history of other malignant neoplasm of kidney: Secondary | ICD-10-CM | POA: Diagnosis not present

## 2019-02-24 DIAGNOSIS — Z923 Personal history of irradiation: Secondary | ICD-10-CM | POA: Diagnosis not present

## 2019-02-24 DIAGNOSIS — E7800 Pure hypercholesterolemia, unspecified: Secondary | ICD-10-CM | POA: Diagnosis not present

## 2019-02-24 DIAGNOSIS — I10 Essential (primary) hypertension: Secondary | ICD-10-CM | POA: Diagnosis not present

## 2019-02-24 DIAGNOSIS — C50911 Malignant neoplasm of unspecified site of right female breast: Secondary | ICD-10-CM | POA: Diagnosis not present

## 2019-03-24 DIAGNOSIS — Z8673 Personal history of transient ischemic attack (TIA), and cerebral infarction without residual deficits: Secondary | ICD-10-CM | POA: Diagnosis not present

## 2019-03-24 DIAGNOSIS — E785 Hyperlipidemia, unspecified: Secondary | ICD-10-CM | POA: Diagnosis not present

## 2019-03-24 DIAGNOSIS — Z85528 Personal history of other malignant neoplasm of kidney: Secondary | ICD-10-CM | POA: Diagnosis not present

## 2019-03-24 DIAGNOSIS — I10 Essential (primary) hypertension: Secondary | ICD-10-CM | POA: Diagnosis not present

## 2019-03-26 DIAGNOSIS — I10 Essential (primary) hypertension: Secondary | ICD-10-CM | POA: Diagnosis not present

## 2019-03-26 DIAGNOSIS — R6882 Decreased libido: Secondary | ICD-10-CM | POA: Diagnosis not present

## 2019-03-31 DIAGNOSIS — Z85528 Personal history of other malignant neoplasm of kidney: Secondary | ICD-10-CM | POA: Diagnosis not present

## 2019-03-31 DIAGNOSIS — I10 Essential (primary) hypertension: Secondary | ICD-10-CM | POA: Diagnosis not present

## 2019-03-31 DIAGNOSIS — E785 Hyperlipidemia, unspecified: Secondary | ICD-10-CM | POA: Diagnosis not present

## 2019-03-31 DIAGNOSIS — Z8673 Personal history of transient ischemic attack (TIA), and cerebral infarction without residual deficits: Secondary | ICD-10-CM | POA: Diagnosis not present

## 2019-04-13 DIAGNOSIS — H1812 Bullous keratopathy, left eye: Secondary | ICD-10-CM | POA: Diagnosis not present

## 2019-04-13 DIAGNOSIS — T868412 Corneal transplant failure, left eye: Secondary | ICD-10-CM | POA: Diagnosis not present

## 2019-04-13 DIAGNOSIS — H18529 Epithelial (juvenile) corneal dystrophy, unspecified eye: Secondary | ICD-10-CM | POA: Diagnosis not present

## 2019-04-15 ENCOUNTER — Other Ambulatory Visit: Payer: Self-pay | Admitting: Internal Medicine

## 2019-04-21 DIAGNOSIS — F5222 Female sexual arousal disorder: Secondary | ICD-10-CM | POA: Diagnosis not present

## 2019-04-21 DIAGNOSIS — R6882 Decreased libido: Secondary | ICD-10-CM | POA: Diagnosis not present

## 2019-04-21 DIAGNOSIS — H401131 Primary open-angle glaucoma, bilateral, mild stage: Secondary | ICD-10-CM | POA: Diagnosis not present

## 2019-04-27 DIAGNOSIS — H2702 Aphakia, left eye: Secondary | ICD-10-CM | POA: Diagnosis not present

## 2019-04-27 DIAGNOSIS — T868412 Corneal transplant failure, left eye: Secondary | ICD-10-CM | POA: Diagnosis not present

## 2019-04-27 DIAGNOSIS — H401130 Primary open-angle glaucoma, bilateral, stage unspecified: Secondary | ICD-10-CM | POA: Diagnosis not present

## 2019-04-27 DIAGNOSIS — H18529 Epithelial (juvenile) corneal dystrophy, unspecified eye: Secondary | ICD-10-CM | POA: Diagnosis not present

## 2019-06-01 ENCOUNTER — Other Ambulatory Visit: Payer: Self-pay | Admitting: Ophthalmology

## 2019-06-14 DIAGNOSIS — Z01812 Encounter for preprocedural laboratory examination: Secondary | ICD-10-CM | POA: Diagnosis not present

## 2019-06-14 DIAGNOSIS — Z20822 Contact with and (suspected) exposure to covid-19: Secondary | ICD-10-CM | POA: Diagnosis not present

## 2019-06-16 DIAGNOSIS — H2702 Aphakia, left eye: Secondary | ICD-10-CM | POA: Diagnosis not present

## 2019-06-16 DIAGNOSIS — T868412 Corneal transplant failure, left eye: Secondary | ICD-10-CM | POA: Diagnosis not present

## 2019-06-19 DIAGNOSIS — M542 Cervicalgia: Secondary | ICD-10-CM | POA: Diagnosis not present

## 2019-06-19 DIAGNOSIS — R21 Rash and other nonspecific skin eruption: Secondary | ICD-10-CM | POA: Diagnosis not present

## 2019-07-07 DIAGNOSIS — M542 Cervicalgia: Secondary | ICD-10-CM | POA: Diagnosis not present

## 2019-07-10 DIAGNOSIS — R1032 Left lower quadrant pain: Secondary | ICD-10-CM | POA: Diagnosis not present

## 2019-07-20 DIAGNOSIS — H401131 Primary open-angle glaucoma, bilateral, mild stage: Secondary | ICD-10-CM | POA: Diagnosis not present

## 2019-07-23 ENCOUNTER — Encounter: Payer: Self-pay | Admitting: *Deleted

## 2019-07-28 DIAGNOSIS — Z947 Corneal transplant status: Secondary | ICD-10-CM | POA: Diagnosis not present

## 2019-08-25 ENCOUNTER — Ambulatory Visit: Payer: 59 | Admitting: Radiation Oncology

## 2019-08-25 DIAGNOSIS — Z170 Estrogen receptor positive status [ER+]: Secondary | ICD-10-CM | POA: Diagnosis not present

## 2019-08-25 DIAGNOSIS — C50911 Malignant neoplasm of unspecified site of right female breast: Secondary | ICD-10-CM | POA: Diagnosis not present

## 2019-08-27 ENCOUNTER — Other Ambulatory Visit: Payer: Self-pay | Admitting: Ophthalmology

## 2019-08-31 DIAGNOSIS — N200 Calculus of kidney: Secondary | ICD-10-CM | POA: Diagnosis not present

## 2019-08-31 DIAGNOSIS — Z905 Acquired absence of kidney: Secondary | ICD-10-CM | POA: Diagnosis not present

## 2019-08-31 DIAGNOSIS — C649 Malignant neoplasm of unspecified kidney, except renal pelvis: Secondary | ICD-10-CM | POA: Diagnosis not present

## 2019-09-07 ENCOUNTER — Other Ambulatory Visit: Payer: Self-pay

## 2019-09-08 ENCOUNTER — Ambulatory Visit
Admission: RE | Admit: 2019-09-08 | Discharge: 2019-09-08 | Disposition: A | Discharge status: Home / Self Care | Payer: 59 | Source: Ambulatory Visit | Attending: Radiation Oncology | Admitting: Radiation Oncology

## 2019-09-08 ENCOUNTER — Encounter: Payer: Self-pay | Admitting: Radiation Oncology

## 2019-09-08 VITALS — BP 166/90 | HR 84 | Temp 98.0°F | Wt 163.2 lb

## 2019-09-08 DIAGNOSIS — Z170 Estrogen receptor positive status [ER+]: Secondary | ICD-10-CM

## 2019-09-08 DIAGNOSIS — Z86000 Personal history of in-situ neoplasm of breast: Secondary | ICD-10-CM | POA: Diagnosis not present

## 2019-09-08 DIAGNOSIS — C50111 Malignant neoplasm of central portion of right female breast: Secondary | ICD-10-CM

## 2019-09-08 NOTE — Progress Notes (Signed)
Radiation Oncology Follow up Note  Name: Makayla Rice   Date:   09/08/2019 MRN:  [id number] DOB: August 09, 1950    This 69 y.o. female presents to the clinic today for 2-year follow-up status post whole breast radiation to right breast for ER/PR positive ductal carcinoma in situ.  REFERRING PROVIDER: Adin Hector, MD  HPI: Patient is a 69 year old female now at 2 years having pleated whole breast radiation to right breast for ER/PR positive ductal carcinoma in situ she is stopped taking antiestrogen therapy based on side effect profile. She specifically denies breast tenderness cough or bone pain she has been having some pain in her right neck is been worked up by multiple doctors without etiology discovered.. She had mammograms back in February at [facility] which were BI-RADS 2 benign.  COMPLICATIONS OF TREATMENT: none  FOLLOW UP COMPLIANCE: keeps appointments   PHYSICAL EXAM:  BP (!) 166/90 (BP Location: Left Arm, Patient Position: Sitting, Cuff Size: Normal)   Pulse 84   Temp 98 F (36.7 C) (Tympanic)   Wt 163 lb 3.2 oz (74 kg)   BMI 28.01 kg/m  Lungs are clear to A&P cardiac examination essentially unremarkable with regular rate and rhythm. No dominant mass or nodularity is noted in either breast in 2 positions examined. Incision is well-healed. No axillary or supraclavicular adenopathy is appreciated. Cosmetic result is excellent. I cannot palpate any evidence of adenopathy or lesions in her right neck. Well-developed well-nourished patient in NAD. HEENT reveals PERLA, EOMI, discs not visualized.  Oral cavity is clear. No oral mucosal lesions are identified. Neck is clear without evidence of cervical or supraclavicular adenopathy. Lungs are clear to A&P. Cardiac examination is essentially unremarkable with regular rate and rhythm without murmur rub or thrill. Abdomen is benign with no organomegaly or masses noted. Motor sensory and DTR levels are equal and symmetric in the upper and lower  extremities. Cranial nerves II through XII are grossly intact. Proprioception is intact. No peripheral adenopathy or edema is identified. No motor or sensory levels are noted. Crude visual fields are within normal range.  RADIOLOGY RESULTS: Mammogram report reviewed compatible with above-stated findings  PLAN: Present time patient is doing well do not see reason to work-up her neck since is is being followed by several other doctors. She otherwise is doing well. I have asked to see her back in 1 year for follow-up. Patient knows to call with any concerns.  I would like to take this opportunity to thank you for allowing me to participate in the care of your patient.Noreene Filbert, MD

## 2019-09-14 DIAGNOSIS — M79604 Pain in right leg: Secondary | ICD-10-CM | POA: Diagnosis not present

## 2019-09-14 DIAGNOSIS — M1711 Unilateral primary osteoarthritis, right knee: Secondary | ICD-10-CM | POA: Diagnosis not present

## 2019-09-14 DIAGNOSIS — M25561 Pain in right knee: Secondary | ICD-10-CM | POA: Diagnosis not present

## 2019-09-15 DIAGNOSIS — L821 Other seborrheic keratosis: Secondary | ICD-10-CM | POA: Diagnosis not present

## 2019-09-15 DIAGNOSIS — D492 Neoplasm of unspecified behavior of bone, soft tissue, and skin: Secondary | ICD-10-CM | POA: Diagnosis not present

## 2019-09-15 DIAGNOSIS — L438 Other lichen planus: Secondary | ICD-10-CM | POA: Diagnosis not present

## 2019-09-17 ENCOUNTER — Other Ambulatory Visit (HOSPITAL_COMMUNITY): Payer: Self-pay | Admitting: Student

## 2019-09-17 ENCOUNTER — Other Ambulatory Visit (HOSPITAL_COMMUNITY): Payer: Self-pay | Admitting: Radiology

## 2019-09-17 ENCOUNTER — Other Ambulatory Visit: Payer: Self-pay | Admitting: Radiology

## 2019-09-17 ENCOUNTER — Other Ambulatory Visit: Payer: Self-pay | Admitting: Internal Medicine

## 2019-09-17 DIAGNOSIS — M79604 Pain in right leg: Secondary | ICD-10-CM

## 2019-09-17 DIAGNOSIS — M25561 Pain in right knee: Secondary | ICD-10-CM

## 2019-09-21 ENCOUNTER — Other Ambulatory Visit: Payer: Self-pay

## 2019-09-21 ENCOUNTER — Ambulatory Visit
Admission: RE | Admit: 2019-09-21 | Discharge: 2019-09-21 | Disposition: A | Discharge status: Home / Self Care | Payer: 59 | Source: Ambulatory Visit | Attending: Student | Admitting: Student

## 2019-09-21 DIAGNOSIS — M25561 Pain in right knee: Secondary | ICD-10-CM | POA: Insufficient documentation

## 2019-09-21 DIAGNOSIS — Z853 Personal history of malignant neoplasm of breast: Secondary | ICD-10-CM | POA: Diagnosis not present

## 2019-09-21 DIAGNOSIS — M79604 Pain in right leg: Secondary | ICD-10-CM | POA: Insufficient documentation

## 2019-09-21 DIAGNOSIS — R600 Localized edema: Secondary | ICD-10-CM | POA: Diagnosis not present

## 2019-09-21 DIAGNOSIS — M7121 Synovial cyst of popliteal space [Baker], right knee: Secondary | ICD-10-CM | POA: Diagnosis not present

## 2019-09-28 DIAGNOSIS — C649 Malignant neoplasm of unspecified kidney, except renal pelvis: Secondary | ICD-10-CM | POA: Diagnosis not present

## 2019-09-29 DIAGNOSIS — E785 Hyperlipidemia, unspecified: Secondary | ICD-10-CM | POA: Diagnosis not present

## 2019-09-29 DIAGNOSIS — Z8673 Personal history of transient ischemic attack (TIA), and cerebral infarction without residual deficits: Secondary | ICD-10-CM | POA: Diagnosis not present

## 2019-09-29 DIAGNOSIS — Z947 Corneal transplant status: Secondary | ICD-10-CM | POA: Diagnosis not present

## 2019-09-29 DIAGNOSIS — I10 Essential (primary) hypertension: Secondary | ICD-10-CM | POA: Diagnosis not present

## 2019-09-29 DIAGNOSIS — Z85528 Personal history of other malignant neoplasm of kidney: Secondary | ICD-10-CM | POA: Diagnosis not present

## 2019-10-02 ENCOUNTER — Other Ambulatory Visit: Payer: Self-pay | Admitting: Orthopedic Surgery

## 2019-10-02 DIAGNOSIS — S86911A Strain of unspecified muscle(s) and tendon(s) at lower leg level, right leg, initial encounter: Secondary | ICD-10-CM | POA: Diagnosis not present

## 2019-10-06 DIAGNOSIS — I10 Essential (primary) hypertension: Secondary | ICD-10-CM | POA: Diagnosis not present

## 2019-10-06 DIAGNOSIS — Z0000 Encounter for general adult medical examination without abnormal findings: Secondary | ICD-10-CM | POA: Diagnosis not present

## 2019-10-06 DIAGNOSIS — Z85528 Personal history of other malignant neoplasm of kidney: Secondary | ICD-10-CM | POA: Diagnosis not present

## 2019-10-06 DIAGNOSIS — R200 Anesthesia of skin: Secondary | ICD-10-CM | POA: Diagnosis not present

## 2019-10-06 DIAGNOSIS — D0511 Intraductal carcinoma in situ of right breast: Secondary | ICD-10-CM | POA: Diagnosis not present

## 2019-11-17 DIAGNOSIS — S86911A Strain of unspecified muscle(s) and tendon(s) at lower leg level, right leg, initial encounter: Secondary | ICD-10-CM | POA: Diagnosis not present

## 2019-11-17 DIAGNOSIS — M1711 Unilateral primary osteoarthritis, right knee: Secondary | ICD-10-CM | POA: Diagnosis not present

## 2019-12-24 ENCOUNTER — Other Ambulatory Visit: Payer: Self-pay | Admitting: Infectious Diseases

## 2019-12-24 DIAGNOSIS — J0100 Acute maxillary sinusitis, unspecified: Secondary | ICD-10-CM | POA: Diagnosis not present

## 2019-12-29 DIAGNOSIS — Z947 Corneal transplant status: Secondary | ICD-10-CM | POA: Diagnosis not present

## 2020-01-05 ENCOUNTER — Other Ambulatory Visit: Payer: Self-pay

## 2020-01-06 ENCOUNTER — Other Ambulatory Visit: Payer: Self-pay | Admitting: Internal Medicine

## 2020-01-10 IMAGING — MG MM BREAST SURGICAL SPECIMEN
1 series · 1 of 1 positions shown · non-contrast
Comparison: Previous exam(s).

CLINICAL DATA: Right breast lumpectomy for biopsy-proven DCIS.
Needle/wire localization was performed earlier today.

EXAM:
SPECIMEN RADIOGRAPH OF THE RIGHT BREAST

[R SPECIMEN]
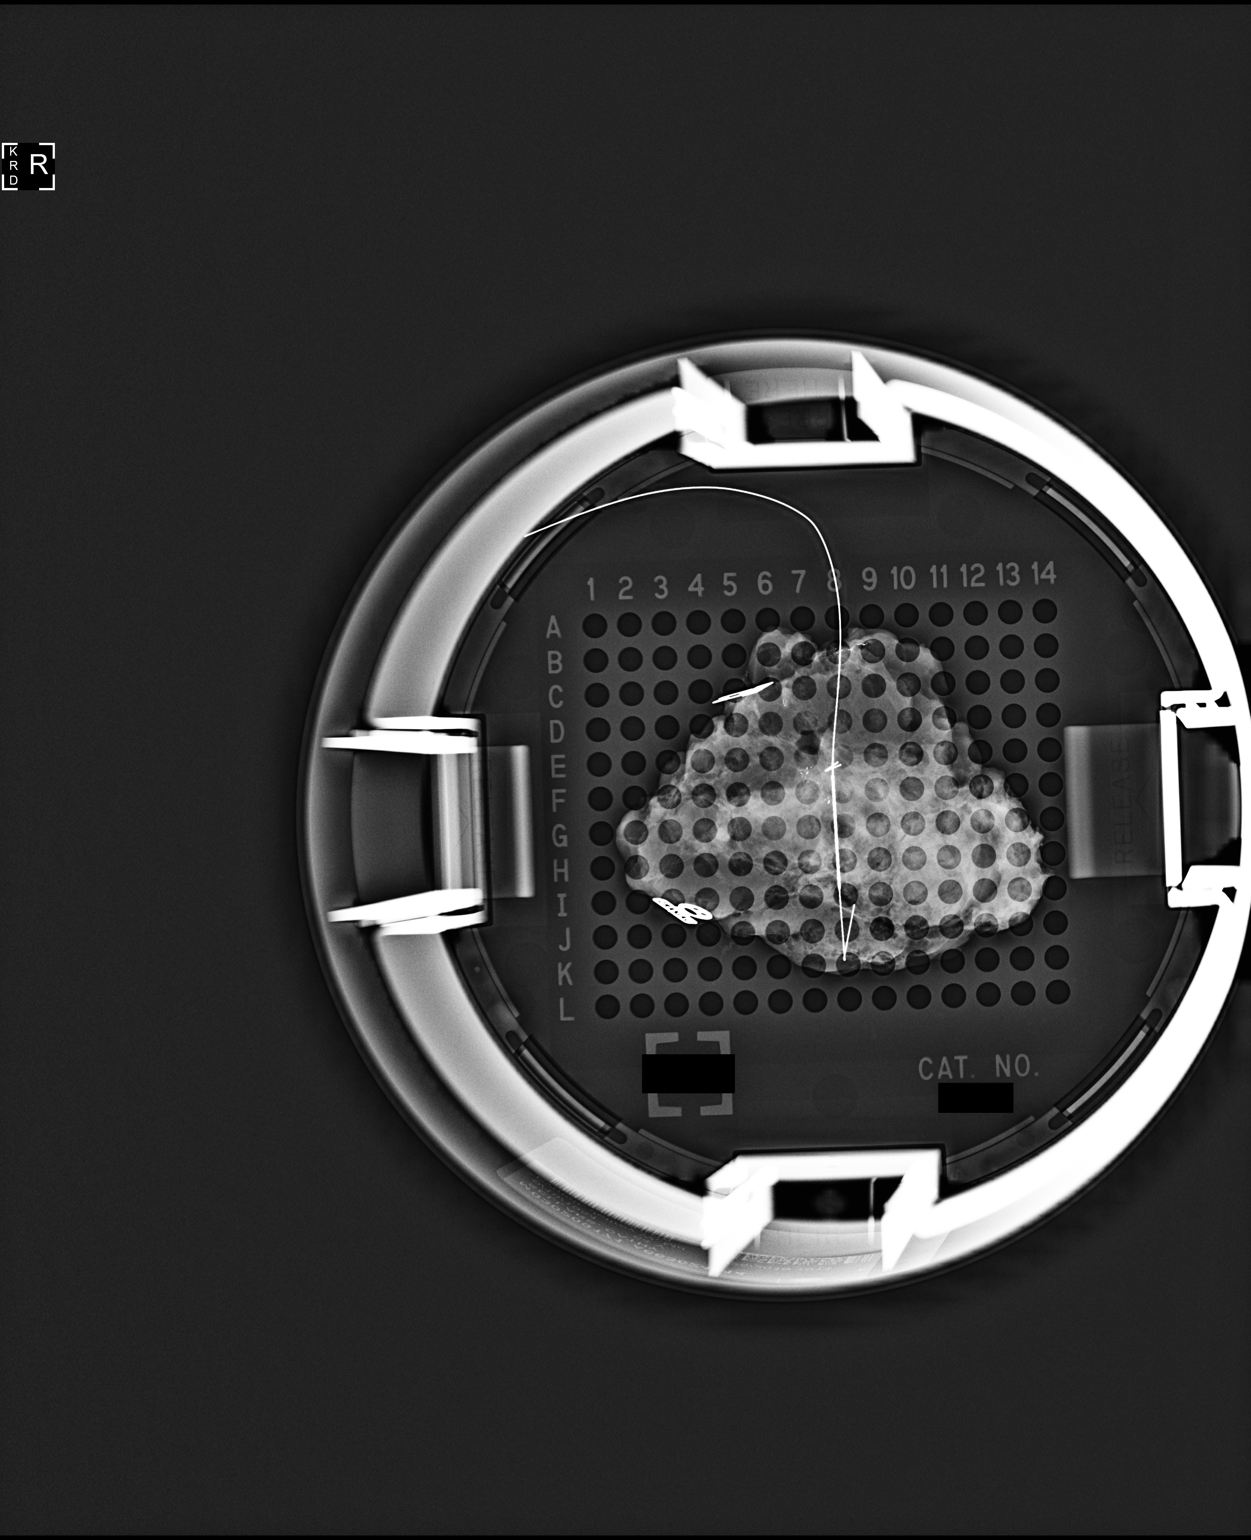

[1 of 1 positions shown; findings below may reference images not displayed]

FINDINGS: Status post excision of the right breast. The intact localization
wire, the ribbon shaped tissue marker clip and the calcifications
associated with the DCIS are present in the specimen. Results were
discussed directly with Dr. Gwen at the time of interpretation on
IMPRESSION: Specimen radiograph of the right breast.

## 2020-01-10 IMAGING — MG MM PLC BREAST LOC DEV 1ST LESION INC*R*
6 series · 6 of 6 positions shown · non-contrast
Comparison: Previous exams.

CLINICAL DATA: Biopsy-proven DCIS involving the upper inner
quadrant of the right breast. Needle/wire localization prior to
lumpectomy.

EXAM:
NEEDLE LOCALIZATION OF THE RIGHT BREAST WITH MAMMO GUIDANCE

[R CC (1 of 3)]
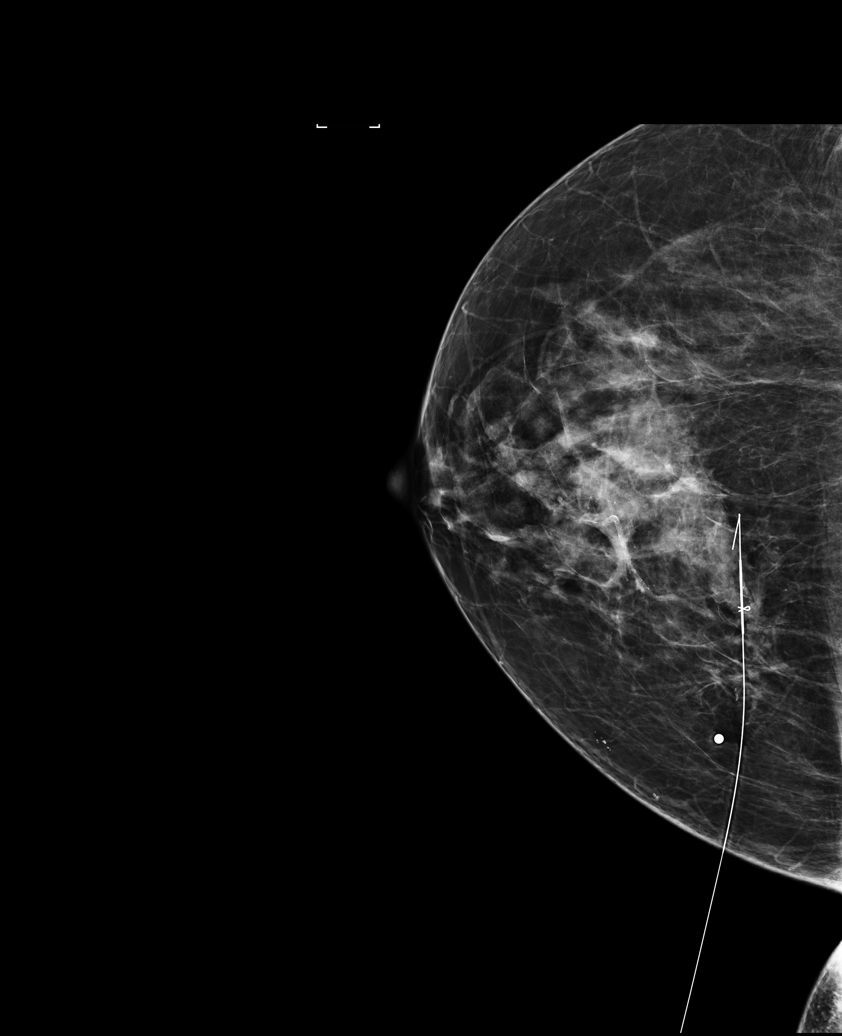

[R ML (1 of 3)]
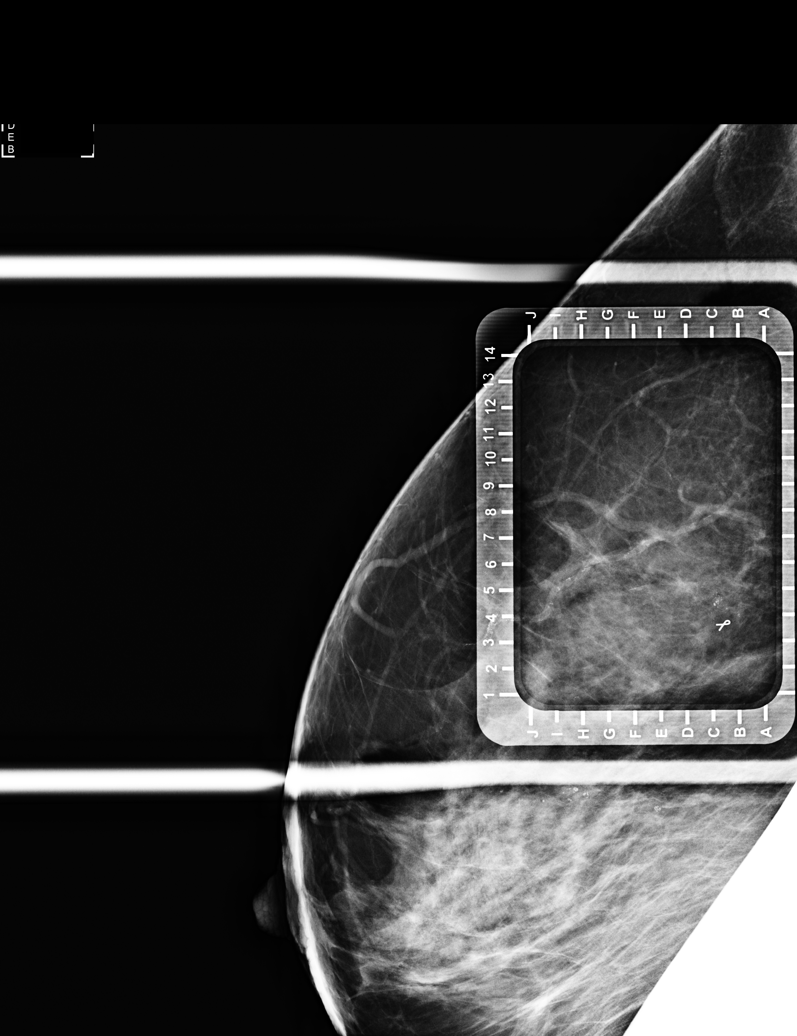

[R ML (2 of 3)]
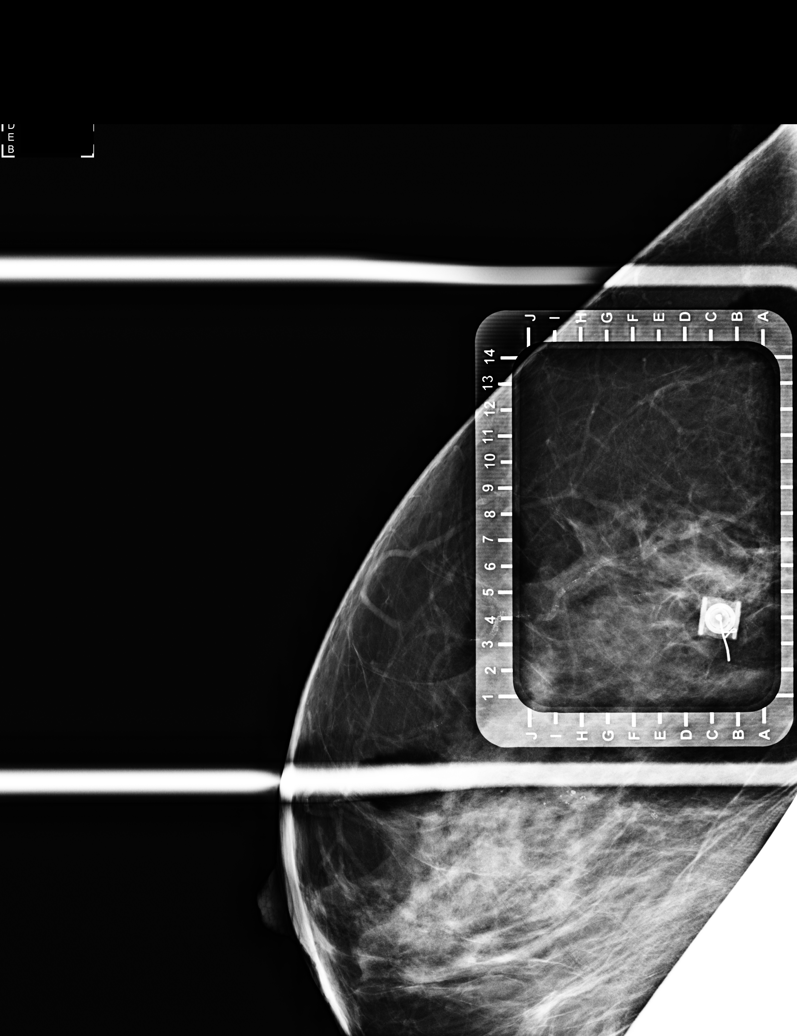

[R CC (2 of 3)]
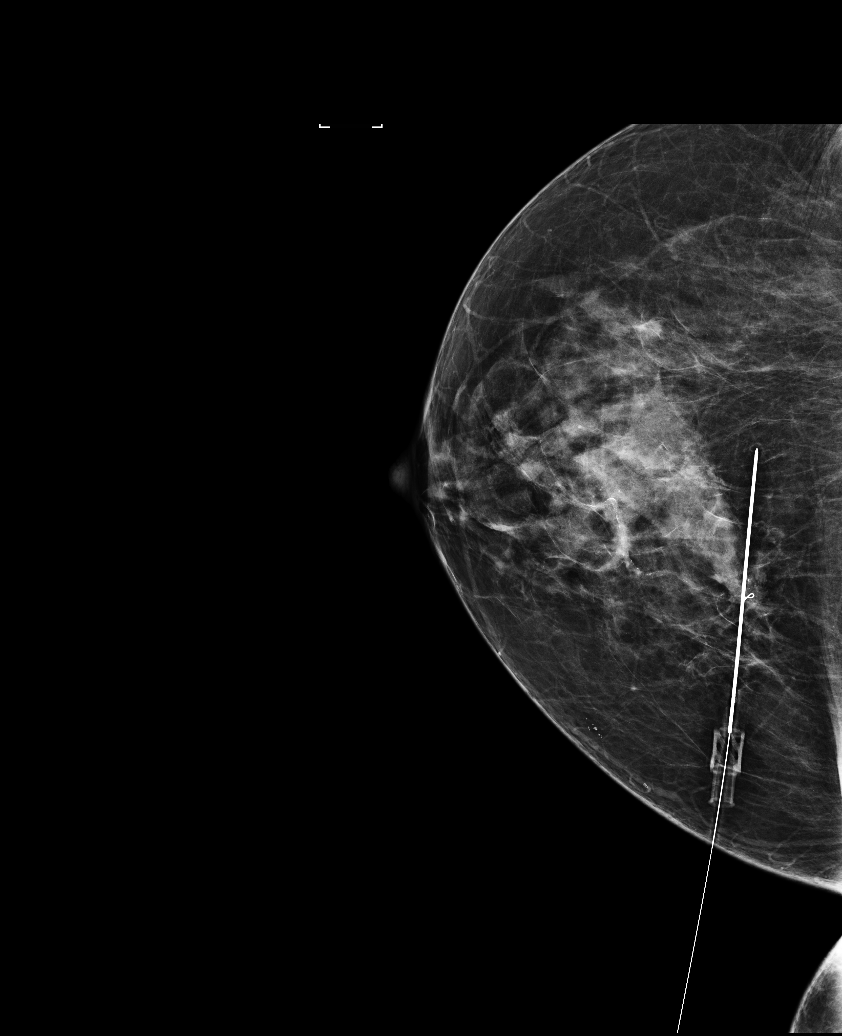

[R CC (3 of 3)]
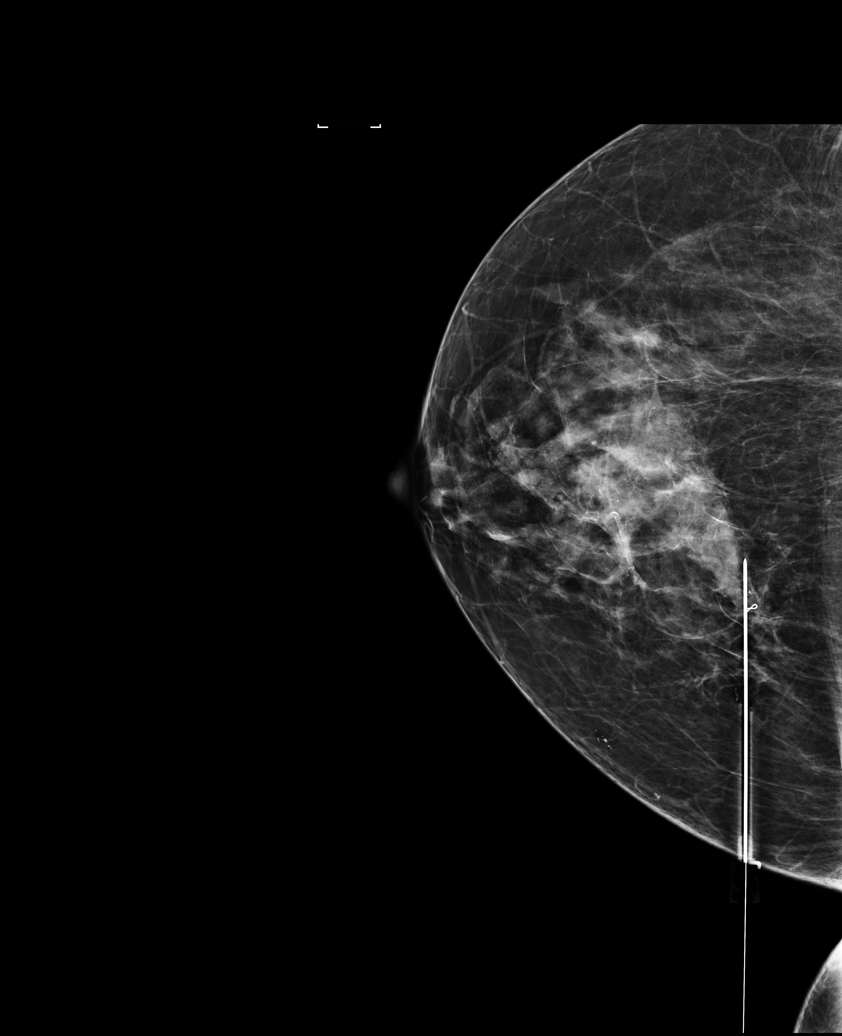

[R ML (3 of 3)]
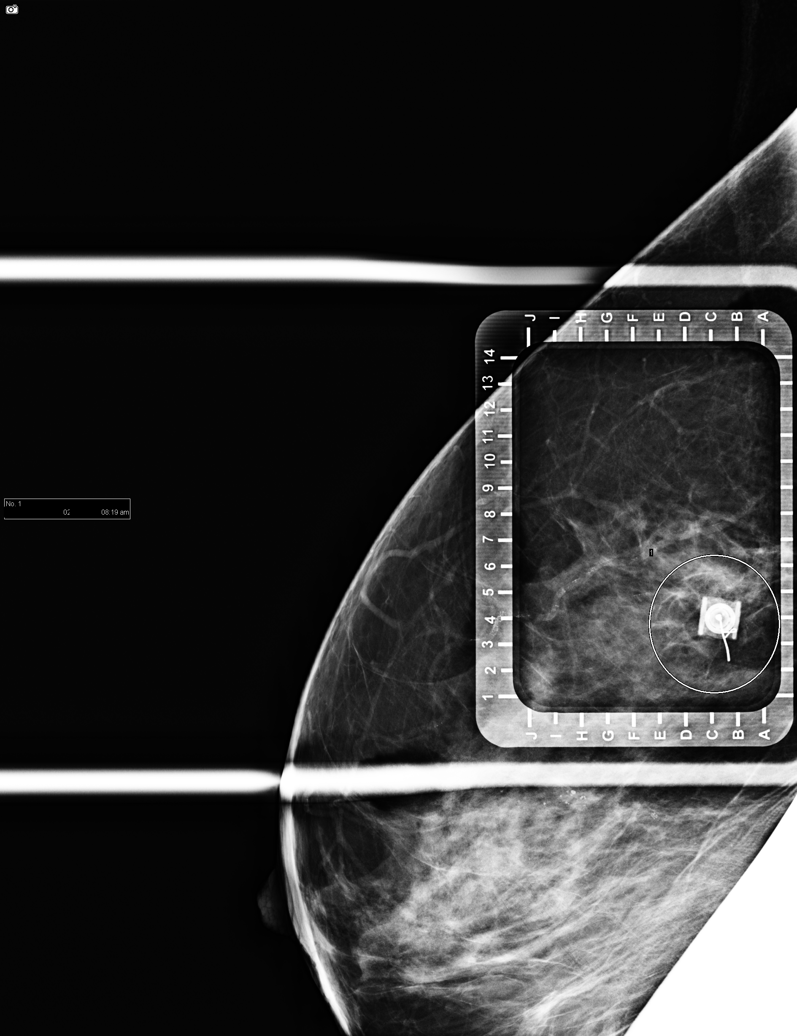

[6 of 6 positions shown; findings below may reference images not displayed]

FINDINGS: Patient presents for needle localization prior to right breast
lumpectomy. I met with the patient and we discussed the procedure of
needle localization including benefits and alternatives. We
discussed the high likelihood of a successful procedure. We
discussed the risks of the procedure, including infection, bleeding,
tissue injury, and further surgery. Informed, written consent was
given. The usual time-out protocol was performed immediately prior
to the procedure.

Using mammographic guidance, sterile technique with chlorhexidine as
skin antisepsis, 1% lidocaine as local anesthetic, a 7 cm modified
Kopans needle was used to localize the calcifications and the
associated ribbon shaped tissue marker clip using a medial approach.
The images were marked for Dr. Morten.
IMPRESSION: Needle localization of biopsy-proven DCIS involving the upper inner
quadrant of the right breast. No apparent complications.

## 2020-01-18 DIAGNOSIS — Z947 Corneal transplant status: Secondary | ICD-10-CM | POA: Diagnosis not present

## 2020-01-18 DIAGNOSIS — Z961 Presence of intraocular lens: Secondary | ICD-10-CM | POA: Diagnosis not present

## 2020-03-01 DIAGNOSIS — I10 Essential (primary) hypertension: Secondary | ICD-10-CM | POA: Diagnosis not present

## 2020-03-01 DIAGNOSIS — Z8041 Family history of malignant neoplasm of ovary: Secondary | ICD-10-CM | POA: Diagnosis not present

## 2020-03-01 DIAGNOSIS — Z170 Estrogen receptor positive status [ER+]: Secondary | ICD-10-CM | POA: Diagnosis not present

## 2020-03-01 DIAGNOSIS — R922 Inconclusive mammogram: Secondary | ICD-10-CM | POA: Diagnosis not present

## 2020-03-01 DIAGNOSIS — Z853 Personal history of malignant neoplasm of breast: Secondary | ICD-10-CM | POA: Diagnosis not present

## 2020-03-01 DIAGNOSIS — C50911 Malignant neoplasm of unspecified site of right female breast: Secondary | ICD-10-CM | POA: Diagnosis not present

## 2020-03-02 ENCOUNTER — Other Ambulatory Visit: Payer: Self-pay | Admitting: Ophthalmology

## 2020-03-29 DIAGNOSIS — I10 Essential (primary) hypertension: Secondary | ICD-10-CM | POA: Diagnosis not present

## 2020-03-29 DIAGNOSIS — E785 Hyperlipidemia, unspecified: Secondary | ICD-10-CM | POA: Diagnosis not present

## 2020-03-29 DIAGNOSIS — D0511 Intraductal carcinoma in situ of right breast: Secondary | ICD-10-CM | POA: Diagnosis not present

## 2020-03-29 DIAGNOSIS — Z85528 Personal history of other malignant neoplasm of kidney: Secondary | ICD-10-CM | POA: Diagnosis not present

## 2020-03-29 DIAGNOSIS — Z947 Corneal transplant status: Secondary | ICD-10-CM | POA: Diagnosis not present

## 2020-03-29 DIAGNOSIS — H401111 Primary open-angle glaucoma, right eye, mild stage: Secondary | ICD-10-CM | POA: Diagnosis not present

## 2020-04-05 DIAGNOSIS — M19011 Primary osteoarthritis, right shoulder: Secondary | ICD-10-CM | POA: Diagnosis not present

## 2020-04-05 DIAGNOSIS — I10 Essential (primary) hypertension: Secondary | ICD-10-CM | POA: Diagnosis not present

## 2020-04-05 DIAGNOSIS — M542 Cervicalgia: Secondary | ICD-10-CM | POA: Diagnosis not present

## 2020-04-05 DIAGNOSIS — Z85528 Personal history of other malignant neoplasm of kidney: Secondary | ICD-10-CM | POA: Diagnosis not present

## 2020-04-05 DIAGNOSIS — E785 Hyperlipidemia, unspecified: Secondary | ICD-10-CM | POA: Diagnosis not present

## 2020-04-05 DIAGNOSIS — Z8673 Personal history of transient ischemic attack (TIA), and cerebral infarction without residual deficits: Secondary | ICD-10-CM | POA: Diagnosis not present

## 2020-04-05 DIAGNOSIS — M25511 Pain in right shoulder: Secondary | ICD-10-CM | POA: Diagnosis not present

## 2020-04-11 ENCOUNTER — Other Ambulatory Visit: Payer: Self-pay

## 2020-04-11 MED FILL — Prednisolone Acetate Ophth Susp 1%: OPHTHALMIC | 30 days supply | Qty: 10 | Fill #0 | Status: AC

## 2020-04-12 DIAGNOSIS — H401111 Primary open-angle glaucoma, right eye, mild stage: Secondary | ICD-10-CM | POA: Diagnosis not present

## 2020-04-19 DIAGNOSIS — M7541 Impingement syndrome of right shoulder: Secondary | ICD-10-CM | POA: Diagnosis not present

## 2020-04-19 DIAGNOSIS — M25511 Pain in right shoulder: Secondary | ICD-10-CM | POA: Diagnosis not present

## 2020-04-19 DIAGNOSIS — M7551 Bursitis of right shoulder: Secondary | ICD-10-CM | POA: Diagnosis not present

## 2020-04-19 DIAGNOSIS — M19011 Primary osteoarthritis, right shoulder: Secondary | ICD-10-CM | POA: Diagnosis not present

## 2020-04-25 DIAGNOSIS — T868412 Corneal transplant failure, left eye: Secondary | ICD-10-CM | POA: Diagnosis not present

## 2020-04-29 ENCOUNTER — Other Ambulatory Visit: Payer: Self-pay

## 2020-04-29 MED FILL — Latanoprost Ophth Soln 0.005%: OPHTHALMIC | 18 days supply | Qty: 2.5 | Fill #0 | Status: AC

## 2020-05-13 DIAGNOSIS — B301 Conjunctivitis due to adenovirus: Secondary | ICD-10-CM | POA: Diagnosis not present

## 2020-05-27 ENCOUNTER — Ambulatory Visit: Payer: 59 | Attending: Internal Medicine

## 2020-05-27 ENCOUNTER — Other Ambulatory Visit: Payer: Self-pay

## 2020-05-27 DIAGNOSIS — Z23 Encounter for immunization: Secondary | ICD-10-CM

## 2020-05-27 MED ORDER — PFIZER-BIONT COVID-19 VAC-TRIS 30 MCG/0.3ML IM SUSP
INTRAMUSCULAR | 0 refills | Status: DC
  Filled 2020-05-27: qty 0.3, 1d supply, fill #0

## 2020-05-27 MED FILL — Latanoprost Ophth Soln 0.005%: OPHTHALMIC | 18 days supply | Qty: 2.5 | Fill #1 | Status: AC

## 2020-05-27 NOTE — Progress Notes (Signed)
   Covid-19 Vaccination Clinic  Name:  Makayla Rice    MRN: [id number] DOB: [birth year]  05/27/2020  Makayla Rice was observed post Covid-19 immunization for 15 minutes without incident. She was provided with Vaccine Information Sheet and instruction to access the V-Safe system.   Makayla Rice was instructed to call 911 with any severe reactions post vaccine: Marland Kitchen Difficulty breathing  . Swelling of face and throat  . A fast heartbeat  . A bad rash all over body  . Dizziness and weakness   Immunizations Administered    Name Date Dose VIS Date Route   PFIZER Comrnaty(Gray TOP) Covid-19 Vaccine 05/27/2020 11:31 AM 0.3 mL 12/17/2019 Intramuscular   Manufacturer: [place]   Lot: [id number]   NDC: [place], PharmD, MBA  Clinical Acute Care Pharmacist

## 2020-06-03 DIAGNOSIS — T868412 Corneal transplant failure, left eye: Secondary | ICD-10-CM | POA: Diagnosis not present

## 2020-06-07 ENCOUNTER — Other Ambulatory Visit: Payer: Self-pay

## 2020-06-07 MED FILL — Metoprolol Tartrate Tab 25 MG: ORAL | 90 days supply | Qty: 90 | Fill #0 | Status: AC

## 2020-06-17 ENCOUNTER — Other Ambulatory Visit: Payer: Self-pay

## 2020-06-17 MED ORDER — LOVASTATIN 40 MG PO TABS
ORAL_TABLET | ORAL | 3 refills | Status: DC
  Filled 2020-06-17: qty 180, 90d supply, fill #0
  Filled 2020-09-29: qty 180, 90d supply, fill #1
  Filled 2021-01-17: qty 180, 90d supply, fill #2
  Filled 2021-04-21: qty 180, 90d supply, fill #3

## 2020-06-24 ENCOUNTER — Other Ambulatory Visit: Payer: Self-pay

## 2020-06-24 MED FILL — Latanoprost Ophth Soln 0.005%: OPHTHALMIC | 30 days supply | Qty: 2.5 | Fill #2 | Status: AC

## 2020-07-01 ENCOUNTER — Other Ambulatory Visit: Payer: Self-pay

## 2020-07-01 MED FILL — Prednisolone Acetate Ophth Susp 1%: OPHTHALMIC | 30 days supply | Qty: 10 | Fill #1 | Status: AC

## 2020-07-08 DIAGNOSIS — J00 Acute nasopharyngitis [common cold]: Secondary | ICD-10-CM | POA: Diagnosis not present

## 2020-07-08 DIAGNOSIS — R5383 Other fatigue: Secondary | ICD-10-CM | POA: Diagnosis not present

## 2020-07-08 DIAGNOSIS — M25561 Pain in right knee: Secondary | ICD-10-CM | POA: Diagnosis not present

## 2020-07-29 ENCOUNTER — Other Ambulatory Visit: Payer: Self-pay

## 2020-07-29 MED FILL — Latanoprost Ophth Soln 0.005%: OPHTHALMIC | 30 days supply | Qty: 2.5 | Fill #3 | Status: AC

## 2020-08-04 ENCOUNTER — Other Ambulatory Visit: Payer: Self-pay

## 2020-08-04 DIAGNOSIS — J3489 Other specified disorders of nose and nasal sinuses: Secondary | ICD-10-CM | POA: Diagnosis not present

## 2020-08-04 DIAGNOSIS — R42 Dizziness and giddiness: Secondary | ICD-10-CM | POA: Diagnosis not present

## 2020-08-04 DIAGNOSIS — Z20822 Contact with and (suspected) exposure to covid-19: Secondary | ICD-10-CM | POA: Diagnosis not present

## 2020-08-04 MED FILL — Albuterol Sulfate Inhal Aero 108 MCG/ACT (90MCG Base Equiv): RESPIRATORY_TRACT | 25 days supply | Qty: 18 | Fill #0 | Status: AC

## 2020-08-23 ENCOUNTER — Encounter: Payer: 59 | Admitting: Physical Therapy

## 2020-08-23 DIAGNOSIS — Z947 Corneal transplant status: Secondary | ICD-10-CM | POA: Diagnosis not present

## 2020-08-26 ENCOUNTER — Other Ambulatory Visit: Payer: Self-pay

## 2020-08-29 ENCOUNTER — Other Ambulatory Visit: Payer: Self-pay

## 2020-08-29 MED ORDER — LATANOPROST 0.005 % OP SOLN
OPHTHALMIC | 5 refills | Status: DC
  Filled 2020-08-29 (×2): qty 2.5, 18d supply, fill #0
  Filled 2020-09-22: qty 2.5, 18d supply, fill #1
  Filled 2020-10-28: qty 2.5, 18d supply, fill #2
  Filled 2020-11-18: qty 2.5, 18d supply, fill #3
  Filled 2020-12-28: qty 2.5, 18d supply, fill #4
  Filled 2021-01-27: qty 2.5, 18d supply, fill #5

## 2020-08-30 ENCOUNTER — Encounter: Payer: 59 | Admitting: Physical Therapy

## 2020-08-31 ENCOUNTER — Other Ambulatory Visit: Payer: Self-pay

## 2020-09-06 ENCOUNTER — Encounter: Payer: 59 | Admitting: Physical Therapy

## 2020-09-06 ENCOUNTER — Encounter: Payer: Self-pay | Admitting: Radiation Oncology

## 2020-09-06 ENCOUNTER — Other Ambulatory Visit: Payer: Self-pay

## 2020-09-06 ENCOUNTER — Ambulatory Visit
Admission: RE | Admit: 2020-09-06 | Discharge: 2020-09-06 | Disposition: A | Discharge status: Home / Self Care | Payer: 59 | Source: Ambulatory Visit | Attending: Radiation Oncology | Admitting: Radiation Oncology

## 2020-09-06 VITALS — BP 152/67 | HR 73 | Temp 97.4°F | Wt 156.9 lb

## 2020-09-06 DIAGNOSIS — Z923 Personal history of irradiation: Secondary | ICD-10-CM | POA: Insufficient documentation

## 2020-09-06 DIAGNOSIS — Z170 Estrogen receptor positive status [ER+]: Secondary | ICD-10-CM

## 2020-09-06 DIAGNOSIS — C50111 Malignant neoplasm of central portion of right female breast: Secondary | ICD-10-CM

## 2020-09-06 DIAGNOSIS — Z08 Encounter for follow-up examination after completed treatment for malignant neoplasm: Secondary | ICD-10-CM | POA: Diagnosis not present

## 2020-09-06 DIAGNOSIS — Z86000 Personal history of in-situ neoplasm of breast: Secondary | ICD-10-CM | POA: Diagnosis not present

## 2020-09-06 NOTE — Progress Notes (Signed)
Radiation Oncology Follow up Note  Name: Gary Stickney   Date:   09/06/2020 MRN:  [id number] DOB: [birth year]    This 70 y.o. female presents to the clinic today for 3-year follow-up status post whole breast radiation to her right breast for ER/PR positive ductal carcinoma in situ.  REFERRING PROVIDER: Adin Hector, MD  HPI: Patient is a 70 year old female now out 3 years having completed whole breast radiation to her right breast for ER/PR positive ductal carcinoma in situ seen today in routine follow-up she is doing well.  She specifically denies breast tenderness cough or bone pain..  Mammograms done at [facility] back in February were BI-RADS 2 benign.  She is not on antiestrogen therapy based on side effect profile  COMPLICATIONS OF TREATMENT: none  FOLLOW UP COMPLIANCE: keeps appointments   PHYSICAL EXAM:  BP (!) 152/67   Pulse 73   Temp (!) 97.4 F (36.3 C) (Tympanic)   Wt 156 lb 14.4 oz (71.2 kg)   BMI 26.93 kg/m  Lungs are clear to A&P cardiac examination essentially unremarkable with regular rate and rhythm. No dominant mass or nodularity is noted in either breast in 2 positions examined. Incision is well-healed. No axillary or supraclavicular adenopathy is appreciated. Cosmetic result is excellent.  Well-developed well-nourished patient in NAD. HEENT reveals PERLA, EOMI, discs not visualized.  Oral cavity is clear. No oral mucosal lesions are identified. Neck is clear without evidence of cervical or supraclavicular adenopathy. Lungs are clear to A&P. Cardiac examination is essentially unremarkable with regular rate and rhythm without murmur rub or thrill. Abdomen is benign with no organomegaly or masses noted. Motor sensory and DTR levels are equal and symmetric in the upper and lower extremities. Cranial nerves II through XII are grossly intact. Proprioception is intact. No peripheral adenopathy or edema is identified. No motor or sensory levels are noted. Crude visual fields are  within normal range.  RADIOLOGY RESULTS: Mammograms reviewed compatible with above-stated findings  PLAN: Present time patient is doing well with no evidence of disease now about 3 years out.  And pleased with her overall progress I will see her back in 1 more year and then discontinue follow-up care.  Patient knows to call with any concerns.  I would like to take this opportunity to thank you for allowing me to participate in the care of your patient.Noreene Filbert, MD

## 2020-09-07 ENCOUNTER — Other Ambulatory Visit: Payer: Self-pay

## 2020-09-07 MED ORDER — METOPROLOL TARTRATE 25 MG PO TABS
25.0000 mg | ORAL_TABLET | Freq: Every day | ORAL | 1 refills | Status: DC
  Filled 2020-09-07: qty 90, 90d supply, fill #0
  Filled 2020-12-08: qty 90, 90d supply, fill #1

## 2020-09-13 ENCOUNTER — Encounter: Payer: 59 | Admitting: Physical Therapy

## 2020-09-20 ENCOUNTER — Encounter: Payer: 59 | Admitting: Physical Therapy

## 2020-09-22 ENCOUNTER — Other Ambulatory Visit: Payer: Self-pay

## 2020-09-27 ENCOUNTER — Encounter: Payer: 59 | Admitting: Physical Therapy

## 2020-09-29 ENCOUNTER — Other Ambulatory Visit: Payer: Self-pay

## 2020-10-04 ENCOUNTER — Other Ambulatory Visit: Payer: Self-pay

## 2020-10-04 ENCOUNTER — Encounter: Payer: 59 | Admitting: Physical Therapy

## 2020-10-04 DIAGNOSIS — E785 Hyperlipidemia, unspecified: Secondary | ICD-10-CM | POA: Diagnosis not present

## 2020-10-04 DIAGNOSIS — Z8673 Personal history of transient ischemic attack (TIA), and cerebral infarction without residual deficits: Secondary | ICD-10-CM | POA: Diagnosis not present

## 2020-10-04 DIAGNOSIS — Z85528 Personal history of other malignant neoplasm of kidney: Secondary | ICD-10-CM | POA: Diagnosis not present

## 2020-10-04 DIAGNOSIS — I10 Essential (primary) hypertension: Secondary | ICD-10-CM | POA: Diagnosis not present

## 2020-10-04 MED FILL — Prednisolone Acetate Ophth Susp 1%: OPHTHALMIC | 30 days supply | Qty: 10 | Fill #2 | Status: AC

## 2020-10-11 ENCOUNTER — Encounter: Payer: 59 | Admitting: Physical Therapy

## 2020-10-11 DIAGNOSIS — E785 Hyperlipidemia, unspecified: Secondary | ICD-10-CM | POA: Diagnosis not present

## 2020-10-11 DIAGNOSIS — Z0000 Encounter for general adult medical examination without abnormal findings: Secondary | ICD-10-CM | POA: Diagnosis not present

## 2020-10-11 DIAGNOSIS — Z85528 Personal history of other malignant neoplasm of kidney: Secondary | ICD-10-CM | POA: Diagnosis not present

## 2020-10-11 DIAGNOSIS — I10 Essential (primary) hypertension: Secondary | ICD-10-CM | POA: Diagnosis not present

## 2020-10-18 ENCOUNTER — Encounter: Payer: 59 | Admitting: Physical Therapy

## 2020-10-28 ENCOUNTER — Other Ambulatory Visit: Payer: Self-pay

## 2020-10-28 DIAGNOSIS — Z947 Corneal transplant status: Secondary | ICD-10-CM | POA: Diagnosis not present

## 2020-10-28 DIAGNOSIS — Z961 Presence of intraocular lens: Secondary | ICD-10-CM | POA: Diagnosis not present

## 2020-10-28 DIAGNOSIS — T868412 Corneal transplant failure, left eye: Secondary | ICD-10-CM | POA: Diagnosis not present

## 2020-11-17 DIAGNOSIS — Z1151 Encounter for screening for human papillomavirus (HPV): Secondary | ICD-10-CM | POA: Diagnosis not present

## 2020-11-17 DIAGNOSIS — R875 Abnormal microbiological findings in specimens from female genital organs: Secondary | ICD-10-CM | POA: Diagnosis not present

## 2020-11-17 DIAGNOSIS — Z87411 Personal history of vaginal dysplasia: Secondary | ICD-10-CM | POA: Diagnosis not present

## 2020-11-17 DIAGNOSIS — R87612 Low grade squamous intraepithelial lesion on cytologic smear of cervix (LGSIL): Secondary | ICD-10-CM | POA: Diagnosis not present

## 2020-11-17 DIAGNOSIS — Z01419 Encounter for gynecological examination (general) (routine) without abnormal findings: Secondary | ICD-10-CM | POA: Diagnosis not present

## 2020-11-17 DIAGNOSIS — Z8742 Personal history of other diseases of the female genital tract: Secondary | ICD-10-CM | POA: Diagnosis not present

## 2020-11-17 DIAGNOSIS — Z124 Encounter for screening for malignant neoplasm of cervix: Secondary | ICD-10-CM | POA: Diagnosis not present

## 2020-11-18 ENCOUNTER — Other Ambulatory Visit: Payer: Self-pay

## 2020-11-24 ENCOUNTER — Other Ambulatory Visit: Payer: Self-pay

## 2020-11-24 DIAGNOSIS — R6889 Other general symptoms and signs: Secondary | ICD-10-CM | POA: Diagnosis not present

## 2020-11-24 DIAGNOSIS — R058 Other specified cough: Secondary | ICD-10-CM | POA: Diagnosis not present

## 2020-11-24 DIAGNOSIS — Z1211 Encounter for screening for malignant neoplasm of colon: Secondary | ICD-10-CM | POA: Diagnosis not present

## 2020-11-24 DIAGNOSIS — J069 Acute upper respiratory infection, unspecified: Secondary | ICD-10-CM | POA: Diagnosis not present

## 2020-11-24 MED ORDER — PAXLOVID (300/100) 20 X 150 MG & 10 X 100MG PO TBPK
ORAL_TABLET | ORAL | 0 refills | Status: DC
  Filled 2020-11-24: qty 30, 5d supply, fill #0

## 2020-11-24 MED ORDER — BENZONATATE 200 MG PO CAPS
ORAL_CAPSULE | ORAL | 0 refills | Status: DC
  Filled 2020-11-24: qty 20, 7d supply, fill #0

## 2020-11-24 MED ORDER — FLUCONAZOLE 150 MG PO TABS
150.0000 mg | ORAL_TABLET | Freq: Once | ORAL | 0 refills | Status: AC
  Filled 2020-11-24: qty 1, 1d supply, fill #0

## 2020-12-08 ENCOUNTER — Other Ambulatory Visit: Payer: Self-pay

## 2020-12-20 DIAGNOSIS — Z1211 Encounter for screening for malignant neoplasm of colon: Secondary | ICD-10-CM | POA: Diagnosis not present

## 2020-12-20 DIAGNOSIS — Z947 Corneal transplant status: Secondary | ICD-10-CM | POA: Diagnosis not present

## 2020-12-28 ENCOUNTER — Other Ambulatory Visit: Payer: Self-pay

## 2020-12-28 MED FILL — Albuterol Sulfate Inhal Aero 108 MCG/ACT (90MCG Base Equiv): RESPIRATORY_TRACT | 25 days supply | Qty: 18 | Fill #1 | Status: AC

## 2020-12-28 MED FILL — Prednisolone Acetate Ophth Susp 1%: OPHTHALMIC | 30 days supply | Qty: 5 | Fill #3 | Status: AC

## 2020-12-28 MED FILL — Prednisolone Acetate Ophth Susp 1%: OPHTHALMIC | 75 days supply | Qty: 10 | Fill #3 | Status: CN

## 2020-12-29 DIAGNOSIS — N893 Dysplasia of vagina, unspecified: Secondary | ICD-10-CM | POA: Diagnosis not present

## 2021-01-17 ENCOUNTER — Other Ambulatory Visit: Payer: Self-pay

## 2021-01-17 DIAGNOSIS — M1711 Unilateral primary osteoarthritis, right knee: Secondary | ICD-10-CM | POA: Diagnosis not present

## 2021-01-17 MED ORDER — MELOXICAM 15 MG PO TABS
ORAL_TABLET | ORAL | 0 refills | Status: DC
  Filled 2021-01-17: qty 30, 30d supply, fill #0

## 2021-01-17 MED ORDER — MELOXICAM 15 MG PO TABS
ORAL_TABLET | ORAL | 0 refills | Status: DC
  Filled 2021-01-17: qty 1, 1d supply, fill #0

## 2021-01-27 ENCOUNTER — Other Ambulatory Visit: Payer: Self-pay

## 2021-02-13 DIAGNOSIS — H532 Diplopia: Secondary | ICD-10-CM | POA: Diagnosis not present

## 2021-02-20 DIAGNOSIS — I10 Essential (primary) hypertension: Secondary | ICD-10-CM | POA: Diagnosis not present

## 2021-02-20 DIAGNOSIS — M79601 Pain in right arm: Secondary | ICD-10-CM | POA: Diagnosis not present

## 2021-02-24 ENCOUNTER — Other Ambulatory Visit: Payer: Self-pay

## 2021-02-24 MED ORDER — LATANOPROST 0.005 % OP SOLN
OPHTHALMIC | 5 refills | Status: DC
  Filled 2021-02-24: qty 2.5, 18d supply, fill #0
  Filled 2021-04-14: qty 2.5, 18d supply, fill #1
  Filled 2021-05-04: qty 2.5, 18d supply, fill #2
  Filled 2021-06-16: qty 2.5, 18d supply, fill #3
  Filled 2021-07-14: qty 2.5, 18d supply, fill #4
  Filled 2021-08-18: qty 2.5, 18d supply, fill #5

## 2021-03-01 DIAGNOSIS — Z85528 Personal history of other malignant neoplasm of kidney: Secondary | ICD-10-CM | POA: Diagnosis not present

## 2021-03-01 DIAGNOSIS — I10 Essential (primary) hypertension: Secondary | ICD-10-CM | POA: Diagnosis not present

## 2021-03-01 DIAGNOSIS — Z170 Estrogen receptor positive status [ER+]: Secondary | ICD-10-CM | POA: Diagnosis not present

## 2021-03-01 DIAGNOSIS — Z8041 Family history of malignant neoplasm of ovary: Secondary | ICD-10-CM | POA: Diagnosis not present

## 2021-03-01 DIAGNOSIS — H4010X0 Unspecified open-angle glaucoma, stage unspecified: Secondary | ICD-10-CM | POA: Diagnosis not present

## 2021-03-01 DIAGNOSIS — E7800 Pure hypercholesterolemia, unspecified: Secondary | ICD-10-CM | POA: Diagnosis not present

## 2021-03-01 DIAGNOSIS — C50911 Malignant neoplasm of unspecified site of right female breast: Secondary | ICD-10-CM | POA: Diagnosis not present

## 2021-03-01 DIAGNOSIS — R922 Inconclusive mammogram: Secondary | ICD-10-CM | POA: Diagnosis not present

## 2021-03-02 ENCOUNTER — Other Ambulatory Visit: Payer: Self-pay

## 2021-03-03 ENCOUNTER — Other Ambulatory Visit: Payer: Self-pay

## 2021-03-03 MED ORDER — METOPROLOL TARTRATE 25 MG PO TABS
25.0000 mg | ORAL_TABLET | Freq: Every day | ORAL | 1 refills | Status: DC
  Filled 2021-03-03: qty 90, 90d supply, fill #0
  Filled 2021-05-31: qty 90, 90d supply, fill #1

## 2021-03-06 ENCOUNTER — Other Ambulatory Visit: Payer: Self-pay

## 2021-03-08 ENCOUNTER — Other Ambulatory Visit: Payer: Self-pay

## 2021-03-09 ENCOUNTER — Other Ambulatory Visit: Payer: Self-pay

## 2021-03-09 MED ORDER — PREDNISOLONE ACETATE 1 % OP SUSP
OPHTHALMIC | 0 refills | Status: DC
  Filled 2021-03-09: qty 5, 75d supply, fill #0

## 2021-04-04 DIAGNOSIS — E785 Hyperlipidemia, unspecified: Secondary | ICD-10-CM | POA: Diagnosis not present

## 2021-04-04 DIAGNOSIS — D0511 Intraductal carcinoma in situ of right breast: Secondary | ICD-10-CM | POA: Diagnosis not present

## 2021-04-04 DIAGNOSIS — Z85528 Personal history of other malignant neoplasm of kidney: Secondary | ICD-10-CM | POA: Diagnosis not present

## 2021-04-04 DIAGNOSIS — I10 Essential (primary) hypertension: Secondary | ICD-10-CM | POA: Diagnosis not present

## 2021-04-11 ENCOUNTER — Other Ambulatory Visit: Payer: Self-pay

## 2021-04-11 DIAGNOSIS — Z85528 Personal history of other malignant neoplasm of kidney: Secondary | ICD-10-CM | POA: Diagnosis not present

## 2021-04-11 DIAGNOSIS — Z8673 Personal history of transient ischemic attack (TIA), and cerebral infarction without residual deficits: Secondary | ICD-10-CM | POA: Diagnosis not present

## 2021-04-11 DIAGNOSIS — I10 Essential (primary) hypertension: Secondary | ICD-10-CM | POA: Diagnosis not present

## 2021-04-11 DIAGNOSIS — E785 Hyperlipidemia, unspecified: Secondary | ICD-10-CM | POA: Diagnosis not present

## 2021-04-11 MED ORDER — PREVNAR 20 0.5 ML IM SUSY
PREFILLED_SYRINGE | INTRAMUSCULAR | 0 refills | Status: DC
  Filled 2021-04-14: qty 0.5, 1d supply, fill #0

## 2021-04-14 ENCOUNTER — Other Ambulatory Visit: Payer: Self-pay

## 2021-04-17 ENCOUNTER — Other Ambulatory Visit: Payer: Self-pay

## 2021-04-17 MED ORDER — PREDNISOLONE ACETATE 1 % OP SUSP
OPHTHALMIC | 3 refills | Status: DC
  Filled 2021-04-17 – 2021-05-04 (×3): qty 5, 30d supply, fill #0
  Filled 2021-06-16: qty 5, 30d supply, fill #1
  Filled 2021-08-18: qty 5, 30d supply, fill #2
  Filled 2021-10-09: qty 5, 60d supply, fill #3

## 2021-04-21 ENCOUNTER — Other Ambulatory Visit: Payer: Self-pay

## 2021-05-01 DIAGNOSIS — Z947 Corneal transplant status: Secondary | ICD-10-CM | POA: Diagnosis not present

## 2021-05-04 ENCOUNTER — Other Ambulatory Visit: Payer: Self-pay

## 2021-05-05 ENCOUNTER — Other Ambulatory Visit: Payer: Self-pay

## 2021-05-08 ENCOUNTER — Other Ambulatory Visit: Payer: Self-pay

## 2021-05-09 ENCOUNTER — Other Ambulatory Visit: Payer: Self-pay

## 2021-05-09 MED ORDER — ALBUTEROL SULFATE HFA 108 (90 BASE) MCG/ACT IN AERS
INHALATION_SPRAY | RESPIRATORY_TRACT | 5 refills | Status: DC
  Filled 2021-05-09: qty 6.7, 30d supply, fill #0
  Filled 2022-04-27: qty 6.7, 16d supply, fill #1

## 2021-05-12 ENCOUNTER — Other Ambulatory Visit: Payer: Self-pay

## 2021-05-31 ENCOUNTER — Other Ambulatory Visit: Payer: Self-pay

## 2021-06-16 ENCOUNTER — Other Ambulatory Visit: Payer: Self-pay

## 2021-06-19 DIAGNOSIS — H01115 Allergic dermatitis of left lower eyelid: Secondary | ICD-10-CM | POA: Diagnosis not present

## 2021-07-14 ENCOUNTER — Other Ambulatory Visit: Payer: Self-pay

## 2021-07-17 DIAGNOSIS — I10 Essential (primary) hypertension: Secondary | ICD-10-CM | POA: Diagnosis not present

## 2021-07-17 DIAGNOSIS — Z85528 Personal history of other malignant neoplasm of kidney: Secondary | ICD-10-CM | POA: Diagnosis not present

## 2021-07-17 DIAGNOSIS — D0511 Intraductal carcinoma in situ of right breast: Secondary | ICD-10-CM | POA: Diagnosis not present

## 2021-07-17 DIAGNOSIS — M79601 Pain in right arm: Secondary | ICD-10-CM | POA: Diagnosis not present

## 2021-07-25 ENCOUNTER — Other Ambulatory Visit: Payer: Self-pay

## 2021-07-25 MED ORDER — OMRON 3 SERIES BP MONITOR DEVI
0 refills | Status: AC
  Filled 2021-07-25: qty 1, 1d supply, fill #0

## 2021-08-03 ENCOUNTER — Other Ambulatory Visit: Payer: Self-pay

## 2021-08-04 ENCOUNTER — Other Ambulatory Visit: Payer: Self-pay

## 2021-08-04 DIAGNOSIS — Z947 Corneal transplant status: Secondary | ICD-10-CM | POA: Diagnosis not present

## 2021-08-04 MED ORDER — LOVASTATIN 40 MG PO TABS
ORAL_TABLET | ORAL | 3 refills | Status: DC
  Filled 2021-08-04: qty 180, 90d supply, fill #0
  Filled 2021-11-24: qty 180, 90d supply, fill #1
  Filled 2022-03-16: qty 180, 90d supply, fill #2
  Filled 2022-07-04: qty 180, 90d supply, fill #3

## 2021-08-18 ENCOUNTER — Other Ambulatory Visit: Payer: Self-pay

## 2021-08-21 ENCOUNTER — Other Ambulatory Visit: Payer: Self-pay

## 2021-08-22 ENCOUNTER — Other Ambulatory Visit: Payer: Self-pay

## 2021-08-22 DIAGNOSIS — Z85528 Personal history of other malignant neoplasm of kidney: Secondary | ICD-10-CM | POA: Diagnosis not present

## 2021-08-22 DIAGNOSIS — Z8673 Personal history of transient ischemic attack (TIA), and cerebral infarction without residual deficits: Secondary | ICD-10-CM | POA: Diagnosis not present

## 2021-08-22 DIAGNOSIS — D0511 Intraductal carcinoma in situ of right breast: Secondary | ICD-10-CM | POA: Diagnosis not present

## 2021-08-22 DIAGNOSIS — M79601 Pain in right arm: Secondary | ICD-10-CM | POA: Diagnosis not present

## 2021-08-22 DIAGNOSIS — I10 Essential (primary) hypertension: Secondary | ICD-10-CM | POA: Diagnosis not present

## 2021-08-22 DIAGNOSIS — E785 Hyperlipidemia, unspecified: Secondary | ICD-10-CM | POA: Diagnosis not present

## 2021-08-22 MED ORDER — FLOVENT DISKUS 100 MCG/ACT IN AEPB
INHALATION_SPRAY | RESPIRATORY_TRACT | 1 refills | Status: AC
  Filled 2021-08-22: qty 60, 30d supply, fill #0

## 2021-08-23 ENCOUNTER — Other Ambulatory Visit: Payer: Self-pay

## 2021-08-23 MED ORDER — METOPROLOL TARTRATE 25 MG PO TABS
25.0000 mg | ORAL_TABLET | Freq: Every day | ORAL | 1 refills | Status: DC
  Filled 2021-08-23: qty 90, 90d supply, fill #0
  Filled 2021-11-24: qty 90, 90d supply, fill #1

## 2021-09-06 ENCOUNTER — Ambulatory Visit
Admission: RE | Admit: 2021-09-06 | Discharge: 2021-09-06 | Disposition: A | Discharge status: Home / Self Care | Payer: 59 | Source: Ambulatory Visit | Attending: Radiation Oncology | Admitting: Radiation Oncology

## 2021-09-06 ENCOUNTER — Encounter: Payer: Self-pay | Admitting: Radiation Oncology

## 2021-09-06 VITALS — BP 154/84 | HR 81 | Temp 98.6°F | Resp 16 | Wt 161.9 lb

## 2021-09-06 DIAGNOSIS — Z86000 Personal history of in-situ neoplasm of breast: Secondary | ICD-10-CM | POA: Diagnosis not present

## 2021-09-06 DIAGNOSIS — Z923 Personal history of irradiation: Secondary | ICD-10-CM | POA: Diagnosis not present

## 2021-09-06 DIAGNOSIS — Z170 Estrogen receptor positive status [ER+]: Secondary | ICD-10-CM | POA: Diagnosis not present

## 2021-09-06 DIAGNOSIS — D0511 Intraductal carcinoma in situ of right breast: Secondary | ICD-10-CM | POA: Diagnosis not present

## 2021-09-06 NOTE — Progress Notes (Signed)
Radiation Oncology Follow up Note  Name: Makayla Rice   Date:   09/06/2021 MRN:  [id number] DOB: 08/02/1950    This 71 y.o. female presents to the clinic today for 4-year follow-up status post whole breast radiation to her right breast for ER/PR positive ductal carcinoma in situ.  REFERRING PROVIDER: Adin Hector, MD  HPI: Patient is a 71 year old female now out for years having completed whole breast radiation to her right breast for ER/PR positive ductal carcinoma in situ.  Seen today in routine follow-up she is doing well.  She specifically denies breast tenderness cough or bone pain.  Had mammograms at [facility] which I have reviewed were BI-RADS 2 benign.  She is not on endocrine therapy based on the side effect profile..  COMPLICATIONS OF TREATMENT: none  FOLLOW UP COMPLIANCE: keeps appointments   PHYSICAL EXAM:  BP (!) 154/84 (BP Location: Left Arm, Patient Position: Sitting, Cuff Size: Normal)   Pulse 81   Temp 98.6 F (37 C) (Tympanic)   Resp 16   Wt 161 lb 14.4 oz (73.4 kg)   BMI 27.79 kg/m  Lungs are clear to A&P cardiac examination essentially unremarkable with regular rate and rhythm. No dominant mass or nodularity is noted in either breast in 2 positions examined. Incision is well-healed. No axillary or supraclavicular adenopathy is appreciated. Cosmetic result is excellent.  Well-developed well-nourished patient in NAD. HEENT reveals PERLA, EOMI, discs not visualized.  Oral cavity is clear. No oral mucosal lesions are identified. Neck is clear without evidence of cervical or supraclavicular adenopathy. Lungs are clear to A&P. Cardiac examination is essentially unremarkable with regular rate and rhythm without murmur rub or thrill. Abdomen is benign with no organomegaly or masses noted. Motor sensory and DTR levels are equal and symmetric in the upper and lower extremities. Cranial nerves II through XII are grossly intact. Proprioception is intact. No peripheral adenopathy or  edema is identified. No motor or sensory levels are noted. Crude visual fields are within normal range.  RADIOLOGY RESULTS: Mammograms reviewed compatible with above-stated findings  PLAN: Present time patient is doing well now out over 4 years with no evidence of disease.  I am going to discontinue follow-up care.  She continues follow-up care with medical oncology.  Patient knows to call with any concerns at any time.  I would like to take this opportunity to thank you for allowing me to participate in the care of your patient.Makayla Filbert, MD

## 2021-09-07 ENCOUNTER — Other Ambulatory Visit: Payer: Self-pay

## 2021-09-07 DIAGNOSIS — M5030 Other cervical disc degeneration, unspecified cervical region: Secondary | ICD-10-CM | POA: Diagnosis not present

## 2021-09-07 DIAGNOSIS — M79601 Pain in right arm: Secondary | ICD-10-CM | POA: Diagnosis not present

## 2021-09-07 DIAGNOSIS — M47812 Spondylosis without myelopathy or radiculopathy, cervical region: Secondary | ICD-10-CM | POA: Diagnosis not present

## 2021-09-07 DIAGNOSIS — M19011 Primary osteoarthritis, right shoulder: Secondary | ICD-10-CM | POA: Diagnosis not present

## 2021-09-07 MED ORDER — GABAPENTIN 300 MG PO CAPS
ORAL_CAPSULE | ORAL | 0 refills | Status: DC
  Filled 2021-09-07: qty 30, 30d supply, fill #0

## 2021-09-13 ENCOUNTER — Ambulatory Visit: Payer: 59 | Attending: Sports Medicine

## 2021-09-13 DIAGNOSIS — R29898 Other symptoms and signs involving the musculoskeletal system: Secondary | ICD-10-CM | POA: Insufficient documentation

## 2021-09-13 DIAGNOSIS — M6281 Muscle weakness (generalized): Secondary | ICD-10-CM | POA: Diagnosis not present

## 2021-09-13 DIAGNOSIS — M25611 Stiffness of right shoulder, not elsewhere classified: Secondary | ICD-10-CM | POA: Diagnosis not present

## 2021-09-13 DIAGNOSIS — M25511 Pain in right shoulder: Secondary | ICD-10-CM | POA: Diagnosis not present

## 2021-09-13 NOTE — Therapy (Signed)
OUTPATIENT PHYSICAL THERAPY SHOULDER EVALUATION   Patient Name: Makayla Rice MRN: [id number] DOB:02-Feb-1950, 71 y.o., female Today's Date: 09/13/2021   PT End of Session - 09/13/21 1253     Visit Number 1    Number of Visits 24    Date for PT Re-Evaluation 12/06/21    Authorization Type [employer]    Authorization - Visit Number 1    Authorization - Number of Visits 10    PT Start Time 0930    PT Stop Time 7867    PT Time Calculation (min) 45 min    Activity Tolerance Patient tolerated treatment well    Behavior During Therapy [facility] for tasks assessed/performed             Past Medical History:  Diagnosis Date   Arthritis    Breast cancer ([place]) 11/2016   DCIS   Cancer ([place]) 2013   kidney cancer   Complication of anesthesia    History of kidney stones    Hyperlipidemia    Hypertension    Personal history of radiation therapy 2019   F/u right breast ca   PONV (postoperative nausea and vomiting)    AFTER ROBOTIC KIDNEY SURGERY   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   Past Surgical History:  Procedure Laterality Date   ABDOMINAL HYSTERECTOMY     BREAST BIOPSY Right 11/13/2016   Affirm Bx-path DCIS   BREAST EXCISIONAL BIOPSY Right 03/04/2017   lumpectomy wit np DCIS    BREAST LUMPECTOMY Right 02/2017   DCIS   BREAST LUMPECTOMY WITH NEEDLE LOCALIZATION Right 03/04/2017   Procedure: BREAST LUMPECTOMY WITH NEEDLE LOCALIZATION;  Surgeon: Makayla Bellow, MD;  Location: [facility] ORS;  Service: General;  Laterality: Right;   COLONOSCOPY  2018   EYE SURGERY  2003   KIDNEY SURGERY  2013   ROBOTIC SURGERY   PARTIAL NEPHRECTOMY  2013   Patient Active Problem List   Diagnosis Date Noted   Abnormal vaginal Pap smear 09/25/2017   Ductal carcinoma in situ (DCIS) of right breast 11/19/2016   Hypertension 04/16/2016   Hyperlipidemia, unspecified 04/16/2016   Glaucoma 04/16/2016   History of stroke 07/28/2013   History of renal cell carcinoma 05/27/2012    PCP:  Makayla Lab, MD  REFERRING PROVIDER: Rosalia Hammers, DO  REFERRING DIAG:  [phone] (ICD-10-CM) - Pain in right arm  M50.30 (ICD-10-CM) - Other cervical disc degeneration, unspecified cervical region  M19.011 (ICD-10-CM) - Primary osteoarthritis, right shoulder  M47.812 (ICD-10-CM) - Spondylosis without myelopathy or radiculopathy, cervical region    THERAPY DIAG:  Acute pain of right shoulder  Decreased ROM of right shoulder  Shoulder weakness  Muscle weakness (generalized)  Rationale for Evaluation and Treatment Rehabilitation  ONSET DATE: 06/08/2021  SUBJECTIVE:  SUBJECTIVE STATEMENT: Pt reports she is experiencing increased pain in the R shoulder over the past several months.  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  Pt notes she typically sleeps on her back or side.  When lying on the R side, she experiences the pain the most, and notes no pain when lying on her L.  No red flags noted at this time.  PERTINENT HISTORY: Per MD on 09/07/21:  "Makayla Rice is a 71 y.o. female that presents to clinic today for follow up evaluation and management of right arm pain at the referral of Makayla Lab, MD. They were last evaluated by myself on 04/19/2020 where she was having some right neck and shoulder pain after a fall. At that time, the plan was to use home exercise, over-the-counter medicine, then follow-up for reevaluation depending on clinical course. She has not had any issue until the past couple months without acute injury."   PAIN: Are you having pain? No, pt states she has not started work yet, so she is not in any pain.  Pt reports 6/10 pain is the worst it gets to during work.  Pt notes 10/10 pain at night when going to  bed.  PRECAUTIONS: None  WEIGHT BEARING RESTRICTIONS No  FALLS:  Has patient fallen in last 6 months? No  LIVING ENVIRONMENT: Lives with: lives alone Lives in: House/apartment Stairs: No Has following equipment at home: None  OCCUPATION: Water engineer at [employer]  PLOF: Independent  PATIENT GOALS:  To decrease pain, knowing that it will likely not go away completely.  OBJECTIVE:   DIAGNOSTIC FINDINGS:  No formal diagnostic imaging performed on the UE.   PATIENT SURVEYS:  FOTO 58/64   COGNITION: Overall cognitive status: Within functional limits for tasks assessed     SENSATION: WFL  POSTURE: Pt has slightly forward head and rolled shoulders.  UPPER EXTREMITY ROM:   Active ROM Right eval Left eval  Shoulder flexion 117 deg 121 deg  Shoulder extension    Shoulder abduction 116 deg 164 deg  Shoulder adduction    Shoulder internal rotation    Shoulder external rotation    Elbow flexion [facility] WFL  Elbow extension [facility] WFL  Wrist flexion [facility] WFL  Wrist extension WFL WFL  (Blank rows = not tested)   UPPER EXTREMITY MMT:  MMT Right eval Left eval  Shoulder flexion *4- 4  Shoulder abduction *4- 4  Elbow flexion *3+ 4  Elbow extension 4 4+  Wrist flexion 4+ 4+  Wrist extension 4+ 4+  (* = denotes pain)   SHOULDER SPECIAL TESTS:  All testing below performed for R shoulder  Impingement tests: Neer impingement test: positive , Hawkins/Kennedy impingement test: negative, and Painful arc test: negative  Rotator cuff assessment: Empty can test: positive , Full can test: positive , External rotation lag sign: positive , and Belly press test: positive   Biceps assessment: Yergason's test: negative and Speed's test: positive    JOINT MOBILITY TESTING:  Pt has increased hypomobility in the R shoulder in AP and Inferior glide assessment.    PALPATION:  Pt has noticeable TP's noted in the R biceps and in R UT.     TODAY'S TREATMENT:    TherEx:  Standing Isometric Shoulder Flexion with towel roll, x10 Standing Isometric Shoulder Abduction with towel roll, x10 Standing Isometric Shoulder Extension with towel roll, x10 Standing Isometric  Shoulder Adduction with towel roll, x10  Pt advised to sleep on her back with a pillow to support her arm at night when lying on her back.  Pt given verbal and demonstration and pt noted pain relief with updated positioning.  Pt given verbal, visual, and tactile cues in order to perform exercises correctly.  Pt given these exercises to promote strengthening without damage to musculature and was properly educated to use as part of current HEP  PATIENT EDUCATION: Education details: Pt educated on role of PT and the benefits of skilled therapy to address R shoulder deficits. Person educated: Patient Education method: Explanation, Demonstration, Tactile cues, Verbal cues, and Handouts Education comprehension: verbalized understanding, returned demonstration, verbal cues required, tactile cues required, and needs further education   HOME EXERCISE PROGRAM: Access Code: [email or web address] URL: [email or web address] Date: 09/13/2021 Prepared by: [place] Nation  Exercises - Standing Isometric Shoulder Flexion with Doorway - Arm Bent  - 1 x daily - 7 x weekly - 3 sets - 10 reps - Standing Isometric Shoulder Abduction with Doorway - Arm Bent  - 1 x daily - 7 x weekly - 3 sets - 10 reps - Standing Isometric Shoulder Extension with Doorway - Arm Bent  - 1 x daily - 7 x weekly - 3 sets - 10 reps - Isometric Shoulder Adduction  - 1 x daily - 7 x weekly - 3 sets - 10 reps  ASSESSMENT:  CLINICAL IMPRESSION: Patient is a 71 y.o. female who was seen today for physical therapy evaluation and treatment for pain in R arm/shoulder.  Pt currently has deficits in R shoulder ROM, R shoulder pain, and R shoulder weakness.  Pt does have signs and symptoms indicative of biceps pathology and RTC pathology  involvement.  Pt will benefit from skilled therapy to address current deficits including yet not limited to ROM, pain, and strength deficits.  Pt agreeable with current plan of care and advised to contact the clinic if any questions arise.   OBJECTIVE IMPAIRMENTS decreased ROM, decreased strength, impaired UE functional use, and pain.   ACTIVITY LIMITATIONS carrying, lifting, sleeping, bathing, dressing, and reach over head  PARTICIPATION LIMITATIONS: cleaning, laundry, driving, community activity, and occupation  PERSONAL FACTORS Age, Behavior pattern, Past/current experiences, Profession, Time since onset of injury/illness/exacerbation, and 1-2 comorbidities: arthritis, cancer, and HTN  are also affecting patient's functional outcome.   REHAB POTENTIAL: Good  CLINICAL DECISION MAKING: Stable/uncomplicated  EVALUATION COMPLEXITY: Moderate   GOALS: Goals reviewed with patient? Yes  SHORT TERM GOALS: Target date:  10/25/21  Pt will be independent with HEP in order to demonstrate increased ability to perform tasks related to occupation/hobbies. Baseline: Eval (09/13/21):  Pt given HEP at initial evaluation. Goal status: INITIAL    LONG TERM GOALS: Target date: 12/06/2021    Patient will demonstrate improved function as evidenced by a score of 64 on FOTO measure for full participation in activities at home and in the community. Baseline: Eval (09/13/21): FOTO - 58/64 Goal status: INITIAL  2.  Pt will decrease worst shoulder pain by at least 3 points on the NPRS in order to demonstrate clinically significant reduction in shoulder pain. Baseline: 10/10 at night when resting Goal status: INITIAL  3.  Pt will increase strength by at least 1/2 MMT grade in order to demonstrate improvement in strength and function  Baseline: Gross 4-/5 with painful symptoms in the R shoulder Goal status: INITIAL  4.  Pt will improve ROM to be within 10% of contralateral side in  flexion/abduction.   Baseline: R Shoulder Flexion/Abduction - 116/117 deg Goal status: INITIAL   PLAN: PT FREQUENCY: 1-2x/week  PT DURATION: 12 weeks  PLANNED INTERVENTIONS: Therapeutic exercises, Therapeutic activity, Neuromuscular re-education, Balance training, Gait training, Patient/Family education, Self Care, Joint mobilization, and Dry Needling  PLAN FOR NEXT SESSION: Assess compliance with HEP and introduce new exercises as pain allows.   Gwenlyn Saran, PT, DPT 09/13/21, 3:38 PM

## 2021-09-18 ENCOUNTER — Ambulatory Visit: Payer: 59 | Admitting: Physical Therapy

## 2021-09-18 ENCOUNTER — Encounter: Payer: Self-pay | Admitting: Physical Therapy

## 2021-09-18 DIAGNOSIS — M6281 Muscle weakness (generalized): Secondary | ICD-10-CM

## 2021-09-18 DIAGNOSIS — M25611 Stiffness of right shoulder, not elsewhere classified: Secondary | ICD-10-CM

## 2021-09-18 DIAGNOSIS — M25511 Pain in right shoulder: Secondary | ICD-10-CM

## 2021-09-18 DIAGNOSIS — R29898 Other symptoms and signs involving the musculoskeletal system: Secondary | ICD-10-CM

## 2021-09-18 NOTE — Therapy (Signed)
OUTPATIENT PHYSICAL THERAPY SHOULDER treatment    Patient Name: Makayla Rice MRN: [id number] DOB:29-Sep-1950, 71 y.o., female Today's Date: 09/18/2021   PT End of Session - 09/18/21 1059     Visit Number 2    Number of Visits 24    Date for PT Re-Evaluation 12/06/21    Authorization Type [employer] EM[employer]EE    Authorization - Visit Number 2    Authorization - Number of Visits 10    PT Start Time 1100    PT Stop Time 1140    PT Time Calculation (min) 40 min    Activity Tolerance Patient tolerated treatment well    Behavior During Therapy [facility] for tasks assessed/performed              Past Medical History:  Diagnosis Date   Arthritis    Breast cancer ([place]) 11/2016   DCIS   Cancer ([place]) 2013   kidney cancer   Complication of anesthesia    History of kidney stones    Hyperlipidemia    Hypertension    Personal history of radiation therapy 2019   F/u right breast ca   PONV (postoperative nausea and vomiting)    AFTER ROBOTIC KIDNEY SURGERY   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   Past Surgical History:  Procedure Laterality Date   ABDOMINAL HYSTERECTOMY     BREAST BIOPSY Right 11/13/2016   Affirm Bx-path DCIS   BREAST EXCISIONAL BIOPSY Right 03/04/2017   lumpectomy wit np DCIS    BREAST LUMPECTOMY Right 02/2017   DCIS   BREAST LUMPECTOMY WITH NEEDLE LOCALIZATION Right 03/04/2017   Procedure: BREAST LUMPECTOMY WITH NEEDLE LOCALIZATION;  Surgeon: [name], MD;  Location: [facility] ORS;  Service: General;  Laterality: Right;   COLONOSCOPY  2018   EYE SURGERY  2003   KIDNEY SURGERY  2013   ROBOTIC SURGERY   PARTIAL NEPHRECTOMY  2013   Patient Active Problem List   Diagnosis Date Noted   Abnormal vaginal Pap smear 09/25/2017   Ductal carcinoma in situ (DCIS) of right breast 11/19/2016   Hypertension 04/16/2016   Hyperlipidemia, unspecified 04/16/2016   Glaucoma 04/16/2016   History of stroke 07/28/2013   History of renal cell carcinoma 05/27/2012    PCP:  Ramonita Lab, MD  REFERRING PROVIDER: Rosalia Hammers, DO  REFERRING DIAG:  [phone] (ICD-10-CM) - Pain in right arm  M50.30 (ICD-10-CM) - Other cervical disc degeneration, unspecified cervical region  M19.011 (ICD-10-CM) - Primary osteoarthritis, right shoulder  M47.812 (ICD-10-CM) - Spondylosis without myelopathy or radiculopathy, cervical region    THERAPY DIAG:  Acute pain of right shoulder  Decreased ROM of right shoulder  Shoulder weakness  Muscle weakness (generalized)  Rationale for Evaluation and Treatment Rehabilitation  ONSET DATE: 06/08/2021  SUBJECTIVE:  SUBJECTIVE STATEMENT: Pt reports less c/o R shoulder pain over the last few days and weekend but she also has not been using it very much.   PERTINENT HISTORY: Per MD on 09/07/21:  "Makayla Rice is a 71 y.o. female that presents to clinic today for follow up evaluation and management of right arm pain at the referral of Ramonita Lab, MD. They were last evaluated by myself on 04/19/2020 where she was having some right neck and shoulder pain after a fall. At that time, the plan was to use home exercise, over-the-counter medicine, then follow-up for reevaluation depending on clinical course. She has not had any issue until the past couple months without acute injury."   PAIN: Are you having pain? No pt just started work this date and has not had pain but reports will likely get to 6 or so this evening   PRECAUTIONS: None  WEIGHT BEARING RESTRICTIONS No  FALLS:  Has patient fallen in last 6 months? 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  OBJECTIVE: (objective measures completed at initial evaluation unless otherwise dated)   DIAGNOSTIC FINDINGS:  No formal diagnostic imaging performed on the UE.   PATIENT SURVEYS:  FOTO 58/64   COGNITION: Overall cognitive status: Within functional limits for tasks assessed     SENSATION: WFL  POSTURE: Pt has slightly forward head and rolled shoulders.  UPPER EXTREMITY ROM:   Active ROM Right eval Left eval  Shoulder flexion 117 deg 121 deg  Shoulder extension    Shoulder abduction 116 deg 164 deg  Shoulder adduction    Shoulder internal rotation    Shoulder external rotation    Elbow flexion [facility] WFL  Elbow extension [facility] WFL  Wrist flexion [facility] WFL  Wrist extension WFL WFL  (Blank rows = not tested)   UPPER EXTREMITY MMT:  MMT Right eval Left eval  Shoulder flexion *4- 4  Shoulder abduction *4- 4  Elbow flexion *3+ 4  Elbow extension 4 4+  Wrist flexion 4+ 4+  Wrist extension 4+ 4+  (* = denotes pain)   SHOULDER SPECIAL TESTS:  All testing below performed for R shoulder  Impingement tests: Neer impingement test: positive , Hawkins/Kennedy impingement test: negative, and Painful arc test: negative  Rotator cuff assessment: Empty can test: positive , Full can test: positive , External rotation lag sign: positive , and Belly press test: positive   Biceps assessment: Yergason's test: negative and Speed's test: positive    JOINT MOBILITY TESTING:  Pt has increased hypomobility in the R shoulder in AP and Inferior glide assessment.    PALPATION:  Pt has noticeable TP's noted in the R biceps and in R UT.     TODAY'S TREATMENT:  RIGHT SHOULDER   Manual Therapy  STM to biceps with mobilizations, significant TP noted at proximal end.  PROM  TherEx: reviewed the following HEP exercises  Standing Isometric Shoulder Flexion with towel roll, x20 Standing Isometric Shoulder Abduction with towel roll, x20 Standing  Isometric Shoulder Extension with towel roll, x20 Standing Isometric Shoulder Adduction with towel roll, x20  Pt required significant verbal, visual and tactile cues for proper copmletion of the exercises above, pt encouraged to practice between appointments to ensure she will make adequate progress.   Pt given verbal, visual,  and tactile cues in order to perform exercises correctly.  Pt given these exercises to promote strengthening without damage to musculature and was properly educated to use as part of current HEP  PATIENT EDUCATION: Education details: Pt educated on role of PT and the benefits of skilled therapy to address R shoulder deficits. Person educated: Patient Education method: Explanation, Demonstration, Tactile cues, Verbal cues, and Handouts Education comprehension: verbalized understanding, returned demonstration, verbal cues required, tactile cues required, and needs further education   HOME EXERCISE PROGRAM: Access Code: [email or web address] URL: [email or web address] Date: 09/13/2021 Prepared by: [place]  Exercises - Standing Isometric Shoulder Flexion with Doorway - Arm Bent  - 1 x daily - 7 x weekly - 3 sets - 10 reps - Standing Isometric Shoulder Abduction with Doorway - Arm Bent  - 1 x daily - 7 x weekly - 3 sets - 10 reps - Standing Isometric Shoulder Extension with Doorway - Arm Bent  - 1 x daily - 7 x weekly - 3 sets - 10 reps - Isometric Shoulder Adduction  - 1 x daily - 7 x weekly - 3 sets - 10 reps  ASSESSMENT:  CLINICAL IMPRESSION: Patient presents with good motivation for completion of physical therapy program. Patient has not yet been able to start with home exercise program as she was fairly busy over the weekend but she is motivated to begin physical therapy interventions following this session.  Patient had difficulty with proper performance of exercises and required significant verbal, tactile, and visual cues to ensure proper performance of  home exercise program.  Patient is continuing to have pain and limited range of motion in her right upper extremity in addition to significant pain at the end of her workday and will continue to benefit from physical therapy interventions to improve these deficits.   OBJECTIVE IMPAIRMENTS decreased ROM, decreased strength, impaired UE functional use, and pain.   ACTIVITY LIMITATIONS carrying, lifting, sleeping, bathing, dressing, and reach over head  PARTICIPATION LIMITATIONS: cleaning, laundry, driving, community activity, and occupation  PERSONAL FACTORS Age, Behavior pattern, Past/current experiences, Profession, Time since onset of injury/illness/exacerbation, and 1-2 comorbidities: arthritis, cancer, and HTN  are also affecting patient's functional outcome.   REHAB POTENTIAL: Good  CLINICAL DECISION MAKING: Stable/uncomplicated  EVALUATION COMPLEXITY: Moderate   GOALS: Goals reviewed with patient? Yes  SHORT TERM GOALS: Target date:  10/25/21  Pt will be independent with HEP in order to demonstrate increased ability to perform tasks related to occupation/hobbies. Baseline: Eval (09/13/21):  Pt given HEP at initial evaluation. Goal status: INITIAL    LONG TERM GOALS: Target date: 12/06/2021    Patient will demonstrate improved function as evidenced by a score of 64 on FOTO measure for full participation in activities at home and in the community. Baseline: Eval (09/13/21): FOTO - 58/64 Goal status: INITIAL  2.  Pt will decrease worst shoulder pain by at least 3 points on the NPRS in order to demonstrate clinically significant reduction in shoulder pain. Baseline: 10/10 at night when resting Goal status: INITIAL  3.  Pt will increase strength by at least 1/2 MMT grade in order to demonstrate improvement in strength and function  Baseline: Gross 4-/5 with painful symptoms in the R shoulder Goal status: INITIAL  4.  Pt will improve ROM to be within 10% of contralateral side in  flexion/abduction.  Baseline: R Shoulder Flexion/Abduction - 116/117 deg Goal status: INITIAL   PLAN: PT FREQUENCY: 1-2x/week  PT DURATION: 12 weeks  PLANNED INTERVENTIONS: Therapeutic exercises,  Therapeutic activity, Neuromuscular re-education, Balance training, Gait training, Patient/Family education, Self Care, Joint mobilization, and Dry Needling  PLAN FOR NEXT SESSION: Assess compliance with HEP and introduce new exercises as pain allows.  Rivka Barbara PT, DPT   09/18/21, 11:57 AM

## 2021-09-20 ENCOUNTER — Encounter: Payer: 59 | Admitting: Physical Therapy

## 2021-09-22 ENCOUNTER — Ambulatory Visit: Payer: 59

## 2021-09-22 DIAGNOSIS — M25611 Stiffness of right shoulder, not elsewhere classified: Secondary | ICD-10-CM

## 2021-09-22 DIAGNOSIS — R29898 Other symptoms and signs involving the musculoskeletal system: Secondary | ICD-10-CM | POA: Diagnosis not present

## 2021-09-22 DIAGNOSIS — M25511 Pain in right shoulder: Secondary | ICD-10-CM

## 2021-09-22 DIAGNOSIS — M6281 Muscle weakness (generalized): Secondary | ICD-10-CM

## 2021-09-22 NOTE — Therapy (Signed)
OUTPATIENT PHYSICAL THERAPY SHOULDER treatment    Patient Name: Makayla Rice MRN: [id number] DOB:1950-07-25, 71 y.o., female Today's Date: 09/22/2021   PT End of Session - 09/22/21 0937     Visit Number 3    Number of Visits 24    Date for PT Re-Evaluation 12/06/21    Authorization Type [employer]    Authorization - Visit Number 2    Authorization - Number of Visits 10    PT Start Time 0933    PT Stop Time 1007    PT Time Calculation (min) 34 min    Activity Tolerance Patient tolerated treatment well    Behavior During Therapy [facility] for tasks assessed/performed               Past Medical History:  Diagnosis Date   Arthritis    Breast cancer ([place]) 11/2016   DCIS   Cancer ([place]) 2013   kidney cancer   Complication of anesthesia    History of kidney stones    Hyperlipidemia    Hypertension    Personal history of radiation therapy 2019   F/u right breast ca   PONV (postoperative nausea and vomiting)    AFTER ROBOTIC KIDNEY SURGERY   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   Past Surgical History:  Procedure Laterality Date   ABDOMINAL HYSTERECTOMY     BREAST BIOPSY Right 11/13/2016   Affirm Bx-path DCIS   BREAST EXCISIONAL BIOPSY Right 03/04/2017   lumpectomy wit np DCIS    BREAST LUMPECTOMY Right 02/2017   DCIS   BREAST LUMPECTOMY WITH NEEDLE LOCALIZATION Right 03/04/2017   Procedure: BREAST LUMPECTOMY WITH NEEDLE LOCALIZATION;  Surgeon: [name], MD;  Location: [facility] ORS;  Service: General;  Laterality: Right;   COLONOSCOPY  2018   EYE SURGERY  2003   KIDNEY SURGERY  2013   ROBOTIC SURGERY   PARTIAL NEPHRECTOMY  2013   Patient Active Problem List   Diagnosis Date Noted   Abnormal vaginal Pap smear 09/25/2017   Ductal carcinoma in situ (DCIS) of right breast 11/19/2016   Hypertension 04/16/2016   Hyperlipidemia, unspecified 04/16/2016   Glaucoma 04/16/2016   History of stroke 07/28/2013   History of renal cell carcinoma 05/27/2012     PCP: Ramonita Lab, MD  REFERRING PROVIDER: Rosalia Hammers, DO  REFERRING DIAG:  [phone] (ICD-10-CM) - Pain in right arm  M50.30 (ICD-10-CM) - Other cervical disc degeneration, unspecified cervical region  M19.011 (ICD-10-CM) - Primary osteoarthritis, right shoulder  M47.812 (ICD-10-CM) - Spondylosis without myelopathy or radiculopathy, cervical region    THERAPY DIAG:  Acute pain of right shoulder  Decreased ROM of right shoulder  Shoulder weakness  Muscle weakness (generalized)  Rationale for Evaluation and Treatment Rehabilitation  ONSET DATE: 06/08/2021  SUBJECTIVE:  SUBJECTIVE STATEMENT: . Patient reports doing okay- mostly right shoulder pain at night but no pain at moment  PERTINENT HISTORY: Per MD on 09/07/21:  "Makayla Rice is a 71 y.o. female that presents to clinic today for follow up evaluation and management of right arm pain at the referral of Ramonita Lab, MD. They were last evaluated by myself on 04/19/2020 where she was having some right neck and shoulder pain after a fall. At that time, the plan was to use home exercise, over-the-counter medicine, then follow-up for reevaluation depending on clinical course. She has not had any issue until the past couple months without acute injury."   PAIN: Are you having pain? No pt just started work this date and has not had pain but reports will likely get to 6 or so this evening   PRECAUTIONS: None  WEIGHT BEARING RESTRICTIONS No  FALLS:  Has patient fallen in last 6 months? No  LIVING ENVIRONMENT: Lives with: lives alone Lives in: House/apartment Stairs: No Has following equipment at home: None  OCCUPATION: Water engineer at [employer]  PLOF: Independent  PATIENT GOALS:  To decrease pain, knowing that it will likely not go  away completely.  OBJECTIVE: (objective measures completed at initial evaluation unless otherwise dated)   DIAGNOSTIC FINDINGS:  No formal diagnostic imaging performed on the UE.   PATIENT SURVEYS:  FOTO 58/64   COGNITION: Overall cognitive status: Within functional limits for tasks assessed     SENSATION: WFL  POSTURE: Pt has slightly forward head and rolled shoulders.  UPPER EXTREMITY ROM:   Active ROM Right eval Left eval  Shoulder flexion 117 deg 121 deg  Shoulder extension    Shoulder abduction 116 deg 164 deg  Shoulder adduction    Shoulder internal rotation    Shoulder external rotation    Elbow flexion [facility] WFL  Elbow extension [facility] WFL  Wrist flexion [facility] WFL  Wrist extension WFL WFL  (Blank rows = not tested)   UPPER EXTREMITY MMT:  MMT Right eval Left eval  Shoulder flexion *4- 4  Shoulder abduction *4- 4  Elbow flexion *3+ 4  Elbow extension 4 4+  Wrist flexion 4+ 4+  Wrist extension 4+ 4+  (* = denotes pain)   SHOULDER SPECIAL TESTS:  All testing below performed for R shoulder  Impingement tests: Neer impingement test: positive , Hawkins/Kennedy impingement test: negative, and Painful arc test: negative  Rotator cuff assessment: Empty can test: positive , Full can test: positive , External rotation lag sign: positive , and Belly press test: positive   Biceps assessment: Yergason's test: negative and Speed's test: positive    JOINT MOBILITY TESTING:  Pt has increased hypomobility in the R shoulder in AP and Inferior glide assessment.    PALPATION:  Pt has noticeable TP's noted in the R biceps and in R UT.     TODAY'S TREATMENT:  RIGHT SHOULDER   Manual Therapy  STM to biceps with mobilizations, significant TP noted along muscle belly, proximal and distal insertion.  PROM to right shoulder- All planes- Flex/ext/Abd/Hor abd/ER/IR- mostly sore at end range but near Full ROM upon completion.  TherEx: reviewed the following HEP  exercises Initially reviewed purpose and importance of isometric exercises to improve strength without risking impingement or inflammation in joint. PT utilized manual resistance initially perform 3 reps in each direction- instructing patient in correct technique.    Standing Isometric Shoulder Flexion with towel roll, x 5 sec hold x10 reps Standing Isometric Shoulder Abduction with  towel roll, x 5 sec hold x10 reps Standing Isometric Shoulder Extension with towel roll, x 5 sec hold x10 reps Standing Isometric Shoulder Adduction with towel roll, x 5 sec hold x10 reps  Pt required significant verbal, visual and tactile cues for proper copmletion of the exercises above, pt encouraged to practice between appointments to ensure she will make adequate progress.     PATIENT EDUCATION: Education details: Exercise technique Person educated: Patient Education method: Explanation, Demonstration, Tactile cues, Verbal cues, and Handouts Education comprehension: verbalized understanding, returned demonstration, verbal cues required, tactile cues required, and needs further education   HOME EXERCISE PROGRAM: Access Code: [email or web address] URL: [email or web address] Date: 09/13/2021 Prepared by: [place] [employer]  Exercises - Standing Isometric Shoulder Flexion with Doorway - Arm Bent  - 1 x daily - 7 x weekly - 3 sets - 10 reps - Standing Isometric Shoulder Abduction with Doorway - Arm Bent  - 1 x daily - 7 x weekly - 3 sets - 10 reps - Standing Isometric Shoulder Extension with Doorway - Arm Bent  - 1 x daily - 7 x weekly - 3 sets - 10 reps - Isometric Shoulder Adduction  - 1 x daily - 7 x weekly - 3 sets - 10 reps  ASSESSMENT:  CLINICAL IMPRESSION: Patient responded well to manual techniques demonstrating much improved ROM after manual therapy. She initially struggled with concept of isometric strength training. PT able to start with manual resistance and increased cueing for correct  performance.  Patient is continuing to have pain and limited range of motion in her right upper extremity in addition to significant pain at the end of her workday and will continue to benefit from physical therapy interventions to improve these deficits.   OBJECTIVE IMPAIRMENTS decreased ROM, decreased strength, impaired UE functional use, and pain.   ACTIVITY LIMITATIONS carrying, lifting, sleeping, bathing, dressing, and reach over head  PARTICIPATION LIMITATIONS: cleaning, laundry, driving, community activity, and occupation  PERSONAL FACTORS Age, Behavior pattern, Past/current experiences, Profession, Time since onset of injury/illness/exacerbation, and 1-2 comorbidities: arthritis, cancer, and HTN  are also affecting patient's functional outcome.   REHAB POTENTIAL: Good  CLINICAL DECISION MAKING: Stable/uncomplicated  EVALUATION COMPLEXITY: Moderate   GOALS: Goals reviewed with patient? Yes  SHORT TERM GOALS: Target date:  10/25/21  Pt will be independent with HEP in order to demonstrate increased ability to perform tasks related to occupation/hobbies. Baseline: Eval (09/13/21):  Pt given HEP at initial evaluation. Goal status: INITIAL    LONG TERM GOALS: Target date: 12/06/2021    Patient will demonstrate improved function as evidenced by a score of 64 on FOTO measure for full participation in activities at home and in the community. Baseline: Eval (09/13/21): FOTO - 58/64 Goal status: INITIAL  2.  Pt will decrease worst shoulder pain by at least 3 points on the NPRS in order to demonstrate clinically significant reduction in shoulder pain. Baseline: 10/10 at night when resting Goal status: INITIAL  3.  Pt will increase strength by at least 1/2 MMT grade in order to demonstrate improvement in strength and function  Baseline: Gross 4-/5 with painful symptoms in the R shoulder Goal status: INITIAL  4.  Pt will improve ROM to be within 10% of contralateral side in  flexion/abduction.  Baseline: R Shoulder Flexion/Abduction - 116/117 deg Goal status: INITIAL   PLAN: PT FREQUENCY: 1-2x/week  PT DURATION: 12 weeks  PLANNED INTERVENTIONS: Therapeutic exercises, Therapeutic activity, Neuromuscular re-education, Balance training, Gait training, Patient/Family education, Self Care, Joint mobilization,  and Dry Needling  PLAN FOR NEXT SESSION: Assess compliance with HEP and introduce new exercises as pain allows.  Ollen Bowl, PT 09/22/21, 10:08 AM

## 2021-09-24 NOTE — Therapy (Unsigned)
OUTPATIENT PHYSICAL THERAPY SHOULDER treatment    Patient Name: Makayla Rice MRN: [id number] DOB:13-May-1950, 71 y.o., female Today's Date: 09/25/2021   PT End of Session - 09/25/21 1106     Visit Number 4    Number of Visits 24    Date for PT Re-Evaluation 12/06/21    Authorization Type [employer]    Authorization - Visit Number 4    Authorization - Number of Visits 10    Progress Note Due on Visit 10    PT Start Time 1102    PT Stop Time 1143    PT Time Calculation (min) 41 min    Activity Tolerance Patient tolerated treatment well    Behavior During Therapy [facility] for tasks assessed/performed                Past Medical History:  Diagnosis Date   Arthritis    Breast cancer ([place]) 11/2016   DCIS   Cancer ([place]) 2013   kidney cancer   Complication of anesthesia    History of kidney stones    Hyperlipidemia    Hypertension    Personal history of radiation therapy 2019   F/u right breast ca   PONV (postoperative nausea and vomiting)    AFTER ROBOTIC KIDNEY SURGERY   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   Past Surgical History:  Procedure Laterality Date   ABDOMINAL HYSTERECTOMY     BREAST BIOPSY Right 11/13/2016   Affirm Bx-path DCIS   BREAST EXCISIONAL BIOPSY Right 03/04/2017   lumpectomy wit np DCIS    BREAST LUMPECTOMY Right 02/2017   DCIS   BREAST LUMPECTOMY WITH NEEDLE LOCALIZATION Right 03/04/2017   Procedure: BREAST LUMPECTOMY WITH NEEDLE LOCALIZATION;  Surgeon: [name], MD;  Location: [facility] ORS;  Service: General;  Laterality: Right;   COLONOSCOPY  2018   EYE SURGERY  2003   KIDNEY SURGERY  2013   ROBOTIC SURGERY   PARTIAL NEPHRECTOMY  2013   Patient Active Problem List   Diagnosis Date Noted   Abnormal vaginal Pap smear 09/25/2017   Ductal carcinoma in situ (DCIS) of right breast 11/19/2016   Hypertension 04/16/2016   Hyperlipidemia, unspecified 04/16/2016   Glaucoma 04/16/2016   History of stroke 07/28/2013   History of  renal cell carcinoma 05/27/2012    PCP: Ramonita Lab, MD  REFERRING PROVIDER: Rosalia Hammers, DO  REFERRING DIAG:  [phone] (ICD-10-CM) - Pain in right arm  M50.30 (ICD-10-CM) - Other cervical disc degeneration, unspecified cervical region  M19.011 (ICD-10-CM) - Primary osteoarthritis, right shoulder  M47.812 (ICD-10-CM) - Spondylosis without myelopathy or radiculopathy, cervical region    THERAPY DIAG:  Acute pain of right shoulder  Decreased ROM of right shoulder  Shoulder weakness  Muscle weakness (generalized)  Rationale for Evaluation and Treatment Rehabilitation  ONSET DATE: 06/08/2021  SUBJECTIVE:  SUBJECTIVE STATEMENT: . Patient reports doing well after working over the weekend, pt reports no pain while at work over the weekend and no pain right now.   PERTINENT HISTORY: Per MD on 09/07/21:  "Makayla Rice is a 71 y.o. female that presents to clinic today for follow up evaluation and management of right arm pain at the referral of Ramonita Lab, MD. They were last evaluated by myself on 04/19/2020 where she was having some right neck and shoulder pain after a fall. At that time, the plan was to use home exercise, over-the-counter medicine, then follow-up for reevaluation depending on clinical course. She has not had any issue until the past couple months without acute injury."   PAIN: Are you having pain? No pt just started work this date and has not had pain but reports will likely get to 6 or so this evening   PRECAUTIONS: None  WEIGHT BEARING RESTRICTIONS No  FALLS:  Has patient fallen in last 6 months? 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  OBJECTIVE: (objective measures completed at initial evaluation unless otherwise dated)   DIAGNOSTIC FINDINGS:  No formal diagnostic imaging performed on the UE.   PATIENT SURVEYS:  FOTO 58/64   COGNITION: Overall cognitive status: Within functional limits for tasks assessed     SENSATION: WFL  POSTURE: Pt has slightly forward head and rolled shoulders.  UPPER EXTREMITY ROM:   Active ROM Right eval Left eval  Shoulder flexion 117 deg 121 deg  Shoulder extension    Shoulder abduction 116 deg 164 deg  Shoulder adduction    Shoulder internal rotation    Shoulder external rotation    Elbow flexion [facility] WFL  Elbow extension [facility] WFL  Wrist flexion [facility] WFL  Wrist extension WFL WFL  (Blank rows = not tested)   UPPER EXTREMITY MMT:  MMT Right eval Left eval  Shoulder flexion *4- 4  Shoulder abduction *4- 4  Elbow flexion *3+ 4  Elbow extension 4 4+  Wrist flexion 4+ 4+  Wrist extension 4+ 4+  (* = denotes pain)   SHOULDER SPECIAL TESTS:  All testing below performed for R shoulder  Impingement tests: Neer impingement test: positive , Hawkins/Kennedy impingement test: negative, and Painful arc test: negative  Rotator cuff assessment: Empty can test: positive , Full can test: positive , External rotation lag sign: positive , and Belly press test: positive   Biceps assessment: Yergason's test: negative and Speed's test: positive    JOINT MOBILITY TESTING:  Pt has increased hypomobility in the R shoulder in AP and Inferior glide assessment.    PALPATION:  Pt has noticeable TP's noted in the R biceps and in R UT.     TODAY'S TREATMENT:  RIGHT SHOULDER   Manual Therapy  STM to biceps with mobilizations, significant TP noted along muscle belly, proximal and distal insertion.   PROM to right shoulder- All planes- Flex/ext/Abd/Hor abd/ER/IR- mostly sore at end  range but near Full ROM upon completion.   Shoulder isometrics in supine with PT using hands for isometric resistance x 10 ER and IR  -min discomfort in musculature surrounding elbow  Shoulder perturbation in supine 90 degrees flexion 3 x 60 second holds  - some shoulder fatigue toward end of reps    Supine  shoulder dowel flexion 2 x 10 x 5 second holds, cues for hold time and slow and controlled movement  Supine shoulder scapular punch 10 x 5 second holds   Seated table slides 10 x 5 seconds, pt report good stretch with this activity   Pt required significant verbal, visual and tactile cues for proper copmletion of the exercises above, pt encouraged to practice between appointments to ensure she will make adequate progress.     PATIENT EDUCATION: Education details: Exercise technique Person educated: Patient Education method: Explanation, Demonstration, Tactile cues, Verbal cues, and Handouts Education comprehension: verbalized understanding, returned demonstration, verbal cues required, tactile cues required, and needs further education   HOME EXERCISE PROGRAM: Access Code: [email or web address] URL: [email or web address] Date: 09/13/2021 Prepared by: [place] [employer]  Exercises - Standing Isometric Shoulder Flexion with Doorway - Arm Bent  - 1 x daily - 7 x weekly - 3 sets - 10 reps - Standing Isometric Shoulder Abduction with Doorway - Arm Bent  - 1 x daily - 7 x weekly - 3 sets - 10 reps - Standing Isometric Shoulder Extension with Doorway - Arm Bent  - 1 x daily - 7 x weekly - 3 sets - 10 reps - Isometric Shoulder Adduction  - 1 x daily - 7 x weekly - 3 sets - 10 reps  ASSESSMENT:  CLINICAL IMPRESSION: Patient responded well to manual techniques demonstrating much improved ROM after manual therapy. Pt continues to require significant cues for proper muscular activation with rotator cuff musculature but did improve with TC, verbal cues and practice. Pt progressed with UE  strength and ROM with less c/o pain this date. Pt will continue to benefit from skilled physical therapy intervention to address impairments, improve QOL, and attain therapy goals.     OBJECTIVE IMPAIRMENTS decreased ROM, decreased strength, impaired UE functional use, and pain.   ACTIVITY LIMITATIONS carrying, lifting, sleeping, bathing, dressing, and reach over head  PARTICIPATION LIMITATIONS: cleaning, laundry, driving, community activity, and occupation  PERSONAL FACTORS Age, Behavior pattern, Past/current experiences, Profession, Time since onset of injury/illness/exacerbation, and 1-2 comorbidities: arthritis, cancer, and HTN  are also affecting patient's functional outcome.   REHAB POTENTIAL: Good  CLINICAL DECISION MAKING: Stable/uncomplicated  EVALUATION COMPLEXITY: Moderate   GOALS: Goals reviewed with patient? Yes  SHORT TERM GOALS: Target date:  10/25/21  Pt will be independent with HEP in order to demonstrate increased ability to perform tasks related to occupation/hobbies. Baseline: Eval (09/13/21):  Pt given HEP at initial evaluation. Goal status: INITIAL    LONG TERM GOALS: Target date: 12/06/2021    Patient will demonstrate improved function as evidenced by a score of 64 on FOTO measure for full participation in activities at home and in the community. Baseline: Eval (09/13/21): FOTO - 58/64 Goal status: INITIAL  2.  Pt will decrease worst shoulder pain by at least 3 points on the NPRS in order to demonstrate clinically significant reduction in shoulder pain. Baseline: 10/10 at night when resting Goal status: INITIAL  3.  Pt will increase strength by at least 1/2 MMT grade in order to demonstrate improvement in strength and function  Baseline: Gross 4-/5 with painful symptoms in the R shoulder Goal status: INITIAL  4.  Pt will improve ROM to be within 10% of contralateral side in flexion/abduction.  Baseline: R Shoulder Flexion/Abduction - 116/117 deg Goal  status: INITIAL   PLAN: PT FREQUENCY: 1-2x/week  PT DURATION: 12 weeks  PLANNED INTERVENTIONS: Therapeutic exercises, Therapeutic activity, Neuromuscular re-education, Balance training, Gait training,  Patient/Family education, Self Care, Joint mobilization, and Dry Needling  PLAN FOR NEXT SESSION: Assess compliance with HEP and introduce new exercises as pain allows.  Ollen Bowl, PT 09/25/21, 11:17 AM

## 2021-09-25 ENCOUNTER — Ambulatory Visit: Payer: 59 | Admitting: Physical Therapy

## 2021-09-25 DIAGNOSIS — R29898 Other symptoms and signs involving the musculoskeletal system: Secondary | ICD-10-CM | POA: Diagnosis not present

## 2021-09-25 DIAGNOSIS — M25611 Stiffness of right shoulder, not elsewhere classified: Secondary | ICD-10-CM | POA: Diagnosis not present

## 2021-09-25 DIAGNOSIS — M6281 Muscle weakness (generalized): Secondary | ICD-10-CM

## 2021-09-25 DIAGNOSIS — M25511 Pain in right shoulder: Secondary | ICD-10-CM

## 2021-09-27 ENCOUNTER — Ambulatory Visit: Payer: 59 | Admitting: Physical Therapy

## 2021-09-28 ENCOUNTER — Encounter: Payer: 59 | Admitting: Physical Therapy

## 2021-10-03 DIAGNOSIS — H01115 Allergic dermatitis of left lower eyelid: Secondary | ICD-10-CM | POA: Diagnosis not present

## 2021-10-04 ENCOUNTER — Ambulatory Visit: Payer: 59 | Admitting: Physical Therapy

## 2021-10-06 ENCOUNTER — Other Ambulatory Visit: Payer: Self-pay

## 2021-10-06 ENCOUNTER — Ambulatory Visit: Payer: 59

## 2021-10-06 DIAGNOSIS — M6281 Muscle weakness (generalized): Secondary | ICD-10-CM

## 2021-10-06 DIAGNOSIS — M25511 Pain in right shoulder: Secondary | ICD-10-CM

## 2021-10-06 DIAGNOSIS — M25611 Stiffness of right shoulder, not elsewhere classified: Secondary | ICD-10-CM | POA: Diagnosis not present

## 2021-10-06 DIAGNOSIS — R29898 Other symptoms and signs involving the musculoskeletal system: Secondary | ICD-10-CM

## 2021-10-06 NOTE — Therapy (Signed)
OUTPATIENT PHYSICAL THERAPY SHOULDER treatment    Patient Name: Makayla Rice MRN: [id number] DOB:[birth year], 71 y.o., female Today's Date: 10/06/2021   PT End of Session - 10/06/21 0850     Visit Number 5    Number of Visits 24    Date for PT Re-Evaluation 12/06/21    Authorization Type [employer]    Authorization - Visit Number 4    Authorization - Number of Visits 10    Progress Note Due on Visit 10    PT Start Time 0998    PT Stop Time 0926    PT Time Calculation (min) 39 min    Activity Tolerance Patient tolerated treatment well    Behavior During Therapy [facility] for tasks assessed/performed                Past Medical History:  Diagnosis Date   Arthritis    Breast cancer ([place]) 11/2016   DCIS   Cancer ([place]) 2013   kidney cancer   Complication of anesthesia    History of kidney stones    Hyperlipidemia    Hypertension    Personal history of radiation therapy 2019   F/u right breast ca   PONV (postoperative nausea and vomiting)    AFTER ROBOTIC KIDNEY SURGERY   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   Past Surgical History:  Procedure Laterality Date   ABDOMINAL HYSTERECTOMY     BREAST BIOPSY Right 11/13/2016   Affirm Bx-path DCIS   BREAST EXCISIONAL BIOPSY Right 03/04/2017   lumpectomy wit np DCIS    BREAST LUMPECTOMY Right 02/2017   DCIS   BREAST LUMPECTOMY WITH NEEDLE LOCALIZATION Right 03/04/2017   Procedure: BREAST LUMPECTOMY WITH NEEDLE LOCALIZATION;  Surgeon: Makayla Bellow, MD;  Location: [facility] ORS;  Service: General;  Laterality: Right;   COLONOSCOPY  2018   EYE SURGERY  2003   KIDNEY SURGERY  2013   ROBOTIC SURGERY   PARTIAL NEPHRECTOMY  2013   Patient Active Problem List   Diagnosis Date Noted   Abnormal vaginal Pap smear 09/25/2017   Ductal carcinoma in situ (DCIS) of right breast 11/19/2016   Hypertension 04/16/2016   Hyperlipidemia, unspecified 04/16/2016   Glaucoma 04/16/2016   History of stroke 07/28/2013   History of  renal cell carcinoma 05/27/2012    PCP: Makayla Lab, MD  REFERRING PROVIDER: Rosalia Hammers, DO  REFERRING DIAG:  [id number] (ICD-10-CM) - Pain in right arm  M50.30 (ICD-10-CM) - Other cervical disc degeneration, unspecified cervical region  M19.011 (ICD-10-CM) - Primary osteoarthritis, right shoulder  M47.812 (ICD-10-CM) - Spondylosis without myelopathy or radiculopathy, cervical region    THERAPY DIAG:  Acute pain of right shoulder  Decreased ROM of right shoulder  Shoulder weakness  Muscle weakness (generalized)  Rationale for Evaluation and Treatment Rehabilitation  ONSET DATE: 06/08/2021  SUBJECTIVE:  SUBJECTIVE STATEMENT: . Patient reports shoulder pain is good for now- sleeping better and states as long as she stays active it's not too bad  PERTINENT HISTORY: Per MD on 09/07/21:  "Makayla Rice is a 71 y.o. female that presents to clinic today for follow up evaluation and management of right arm pain at the referral of Makayla Lab, MD. They were last evaluated by myself on 04/19/2020 where she was having some right neck and shoulder pain after a fall. At that time, the plan was to use home exercise, over-the-counter medicine, then follow-up for reevaluation depending on clinical course. She has not had any issue until the past couple months without acute injury."   PAIN: Are you having pain? No   PRECAUTIONS: None  WEIGHT BEARING RESTRICTIONS No  FALLS:  Has patient fallen in last 6 months? No  LIVING ENVIRONMENT: Lives with: lives alone Lives in: House/apartment Stairs: No Has following equipment at home: None  OCCUPATION: Water engineer at [employer]  PLOF: Independent  PATIENT GOALS:  To decrease pain, knowing that it will likely not go away completely.  OBJECTIVE: (objective  measures completed at initial evaluation unless otherwise dated)   DIAGNOSTIC FINDINGS:  No formal diagnostic imaging performed on the UE.   PATIENT SURVEYS:  FOTO 58/64   COGNITION: Overall cognitive status: Within functional limits for tasks assessed     SENSATION: WFL  POSTURE: Pt has slightly forward head and rolled shoulders.  UPPER EXTREMITY ROM:   Active ROM Right eval Left eval  Shoulder flexion 117 deg 121 deg  Shoulder extension    Shoulder abduction 116 deg 164 deg  Shoulder adduction    Shoulder internal rotation    Shoulder external rotation    Elbow flexion [facility] WFL  Elbow extension [facility] WFL  Wrist flexion [facility] WFL  Wrist extension WFL WFL  (Blank rows = not tested)   UPPER EXTREMITY MMT:  MMT Right eval Left eval  Shoulder flexion *4- 4  Shoulder abduction *4- 4  Elbow flexion *3+ 4  Elbow extension 4 4+  Wrist flexion 4+ 4+  Wrist extension 4+ 4+  (* = denotes pain)   SHOULDER SPECIAL TESTS:  All testing below performed for R shoulder  Impingement tests: Neer impingement test: positive , Hawkins/Kennedy impingement test: negative, and Painful arc test: negative  Rotator cuff assessment: Empty can test: positive , Full can test: positive , External rotation lag sign: positive , and Belly press test: positive   Biceps assessment: Yergason's test: negative and Speed's test: positive    JOINT MOBILITY TESTING:  Pt has increased hypomobility in the R shoulder in AP and Inferior glide assessment.    PALPATION:  Pt has noticeable TP's noted in the R biceps and in R UT.     TODAY'S TREATMENT:  RIGHT SHOULDER   Manual Therapy  STM to biceps with mobilizations, some proximal TP noted =Patient reported feeling much better after STM  PROM to right shoulder- All planes- Flex/ext/Abd/Hor abd/ER/IR- mostly  end range soreness with firm end fell - still new  but near Full ROM upon completion.  Grade II-III inf/PA/AP GH joint mobs in varying  angles of flex/abd/ER  Shoulder isometrics in supine with PT using hands for isometric resistance x 10 ER and IR  -no pain yet increased VC to perform correctly  Shoulder perturbation in supine 90 degrees flexion 2 x 60 second holds  - Mild difficulty maintaining position for 60 sec    Supine shoulder dowel flexion, ABD,  ER  2 sets of 10 reps each- Increased VC to perform correctly- added to HEP  Supine serratus punch 2 sets of 10 reps  Seated table slides 10 x 5 seconds, Increased VC and visual demo for correct technique.    Pt required significant verbal, visual and tactile cues for proper copmletion of the exercises above, pt encouraged to practice between appointments to ensure she will make adequate progress.     PATIENT EDUCATION: Education details: Exercise technique Person educated: Patient Education method: Explanation, Demonstration, Tactile cues, Verbal cues, and Handouts Education comprehension: verbalized understanding, returned demonstration, verbal cues required, tactile cues required, and needs further education   HOME EXERCISE PROGRAM: Access Code: [email or web address] URL: [email or web address] Date: 10/06/2021 Prepared by: Sande Brothers  Exercises - Supine Shoulder Flexion Extension AAROM with Dowel  - 1 x daily - 3 sets - 10 reps - Supine Shoulder Abduction AAROM with Dowel  - 1 x daily - 3 sets - 10 reps - Supine Shoulder External Rotation AAROM with Dowel  - 1 x daily - 3 sets - 10 reps        Access Code: [email or web address] URL: [email or web address] Date: 09/13/2021 Prepared by: [place] [employer]  Exercises - Standing Isometric Shoulder Flexion with Doorway - Arm Bent  - 1 x daily - 7 x weekly - 3 sets - 10 reps - Standing Isometric Shoulder Abduction with Doorway - Arm Bent  - 1 x daily - 7 x weekly - 3 sets - 10 reps - Standing Isometric Shoulder Extension with Doorway - Arm Bent  - 1 x daily - 7 x weekly - 3 sets - 10 reps -  Isometric Shoulder Adduction  - 1 x daily - 7 x weekly - 3 sets - 10 reps  ASSESSMENT:  CLINICAL IMPRESSION: Patient reported feeling better after STM to left proximal bicep and ROM of right shoulder. Pt able to present today with near full ROM yet continues to require significant verbal cues and assist for proper exercise technique for best ROM/Muscle activation. Pt progressed with UE strength and ROM with less c/o pain this date. Pt will continue to benefit from skilled physical therapy intervention to address impairments, improve QOL, and attain therapy goals.     OBJECTIVE IMPAIRMENTS decreased ROM, decreased strength, impaired UE functional use, and pain.   ACTIVITY LIMITATIONS carrying, lifting, sleeping, bathing, dressing, and reach over head  PARTICIPATION LIMITATIONS: cleaning, laundry, driving, community activity, and occupation  PERSONAL FACTORS Age, Behavior pattern, Past/current experiences, Profession, Time since onset of injury/illness/exacerbation, and 1-2 comorbidities: arthritis, cancer, and HTN  are also affecting patient's functional outcome.   REHAB POTENTIAL: Good  CLINICAL DECISION MAKING: Stable/uncomplicated  EVALUATION COMPLEXITY: Moderate   GOALS: Goals reviewed with patient? Yes  SHORT TERM GOALS: Target date:  10/25/21  Pt will be independent with HEP in order to demonstrate increased ability to perform tasks related to occupation/hobbies. Baseline: Eval (09/13/21):  Pt given HEP at initial evaluation. Goal status: INITIAL    LONG TERM GOALS: Target date: 12/06/2021    Patient will demonstrate improved function as evidenced by a score of 64 on FOTO measure for full participation in activities at home and in the community. Baseline: Eval (09/13/21): FOTO - 58/64 Goal status: INITIAL  2.  Pt will decrease worst shoulder pain by at least 3 points on the NPRS in order to demonstrate clinically significant reduction in shoulder pain. Baseline: 10/10 at  night when resting Goal status: INITIAL  3.  Pt will increase strength by at  least 1/2 MMT grade in order to demonstrate improvement in strength and function  Baseline: Gross 4-/5 with painful symptoms in the R shoulder Goal status: INITIAL  4.  Pt will improve ROM to be within 10% of contralateral side in flexion/abduction.  Baseline: R Shoulder Flexion/Abduction - 116/117 deg Goal status: INITIAL   PLAN: PT FREQUENCY: 1-2x/week  PT DURATION: 12 weeks  PLANNED INTERVENTIONS: Therapeutic exercises, Therapeutic activity, Neuromuscular re-education, Balance training, Gait training, Patient/Family education, Self Care, Joint mobilization, and Dry Needling  PLAN FOR NEXT SESSION: Assess compliance with HEP and introduce new exercises as pain allows.  Ollen Bowl, PT 10/06/21, 11:28 AM

## 2021-10-06 NOTE — Therapy (Deleted)
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   Past Surgical History:  Procedure Laterality Date   ABDOMINAL HYSTERECTOMY     BREAST BIOPSY Right 11/13/2016   Affirm Bx-path DCIS   BREAST EXCISIONAL BIOPSY Right 03/04/2017   lumpectomy wit np DCIS    BREAST LUMPECTOMY Right 02/2017   DCIS   BREAST LUMPECTOMY WITH NEEDLE LOCALIZATION Right 03/04/2017   Procedure: BREAST LUMPECTOMY WITH NEEDLE LOCALIZATION;  Surgeon: [name], MD;  Location: [facility] ORS;  Service: General;  Laterality: Right;   COLONOSCOPY  2018   EYE SURGERY  2003   KIDNEY SURGERY  2013   ROBOTIC SURGERY   PARTIAL NEPHRECTOMY  2013   Patient Active Problem List   Diagnosis Date Noted   Abnormal vaginal Pap smear 09/25/2017   Ductal carcinoma in situ (DCIS) of right breast 11/19/2016   Hypertension 04/16/2016   Hyperlipidemia, unspecified 04/16/2016   Glaucoma 04/16/2016   History of stroke 07/28/2013   History of renal cell carcinoma 05/27/2012    PCP: Ramonita Lab, MD  REFERRING PROVIDER: Rosalia Hammers, DO  REFERRING DIAG:  [phone] (ICD-10-CM) - Pain in right arm  M50.30 (ICD-10-CM) - Other cervical disc degeneration, unspecified cervical region  M19.011 (ICD-10-CM) - Primary osteoarthritis, right shoulder  M47.812 (ICD-10-CM) - Spondylosis without myelopathy or radiculopathy, cervical region    THERAPY DIAG:  Acute pain of right shoulder  Decreased ROM of right  shoulder  Shoulder weakness  Muscle weakness (generalized)  Rationale for Evaluation and Treatment Rehabilitation  ONSET DATE: 06/08/2021  SUBJECTIVE:                                                                                                                                                                                      SUBJECTIVE STATEMENT: . Patient reports doing well after working over the weekend, pt reports no pain while at work over the weekend and no pain right now.   PERTINENT HISTORY: Per MD on 09/07/21:  "Makayla Rice is a 71 y.o. female that presents to clinic today  for follow up evaluation and management of right arm pain at the referral of Ramonita Lab, MD. They were last evaluated by myself on 04/19/2020 where she was having some right neck and shoulder pain after a fall. At that time, the plan was to use home exercise, over-the-counter medicine, then follow-up for reevaluation depending on clinical course. She has not had any issue until the past couple months without acute injury."   PAIN: Are you having pain? No pt just started work this date and has not had pain but reports will likely get to 6 or so this evening   PRECAUTIONS: None  WEIGHT BEARING RESTRICTIONS No  FALLS:  Has patient fallen in last 6 months? No  LIVING ENVIRONMENT: Lives with: lives alone Lives in: House/apartment Stairs: No Has following equipment at home: None  OCCUPATION: Water engineer at [employer]  PLOF: Independent  PATIENT GOALS:  To decrease pain, knowing that it will likely not go away completely.  OBJECTIVE: (objective measures completed at initial evaluation unless otherwise dated)   DIAGNOSTIC FINDINGS:  No formal diagnostic imaging performed on the UE.   PATIENT SURVEYS:  FOTO 58/64   COGNITION: Overall cognitive status: Within functional limits for tasks assessed     SENSATION: WFL  POSTURE: Pt has slightly forward head and rolled  shoulders.  UPPER EXTREMITY ROM:   Active ROM Right eval Left eval  Shoulder flexion 117 deg 121 deg  Shoulder extension    Shoulder abduction 116 deg 164 deg  Shoulder adduction    Shoulder internal rotation    Shoulder external rotation    Elbow flexion [facility] WFL  Elbow extension [facility] WFL  Wrist flexion [facility] WFL  Wrist extension WFL WFL  (Blank rows = not tested)   UPPER EXTREMITY MMT:  MMT Right eval Left eval  Shoulder flexion *4- 4  Shoulder abduction *4- 4  Elbow flexion *3+ 4  Elbow extension 4 4+  Wrist flexion 4+ 4+  Wrist extension 4+ 4+  (* = denotes pain)   SHOULDER SPECIAL TESTS:  All testing below performed for R shoulder  Impingement tests: Neer impingement test: positive , Hawkins/Kennedy impingement test: negative, and Painful arc test: negative  Rotator cuff assessment: Empty can test: positive , Full can test: positive , External rotation lag sign: positive , and Belly press test: positive   Biceps assessment: Yergason's test: negative and Speed's test: positive    JOINT MOBILITY TESTING:  Pt has increased hypomobility in the R shoulder in AP and Inferior glide assessment.    PALPATION:  Pt has noticeable TP's noted in the R biceps and in R UT.     TODAY'S TREATMENT:  RIGHT SHOULDER   Manual Therapy  STM to biceps with mobilizations, significant TP noted along muscle belly, proximal and distal insertion.   PROM to right shoulder- All planes- Flex/ext/Abd/Hor abd/ER/IR- mostly sore at end range but near Full ROM upon completion.   Shoulder isometrics in supine with PT using hands for isometric resistance x 10 ER and IR  -min discomfort in musculature surrounding elbow  Shoulder perturbation in supine 90 degrees flexion 3 x 60 second holds  - some shoulder fatigue toward end of reps    Supine shoulder dowel flexion 2 x 10 x 5 second holds, cues for hold time and slow and controlled movement  Supine shoulder scapular punch 10 x 5 second  holds   Seated table slides 10 x 5 seconds, pt report good stretch with this activity   Pt  required significant verbal, visual and tactile cues for proper copmletion of the exercises above, pt encouraged to practice between appointments to ensure she will make adequate progress.     PATIENT EDUCATION: Education details: Exercise technique Person educated: Patient Education method: Explanation, Demonstration, Tactile cues, Verbal cues, and Handouts Education comprehension: verbalized understanding, returned demonstration, verbal cues required, tactile cues required, and needs further education   HOME EXERCISE PROGRAM: Access Code: [email or web address] URL: [email or web address] Date: 09/13/2021 Prepared by: [place] [employer]  Exercises - Standing Isometric Shoulder Flexion with Doorway - Arm Bent  - 1 x daily - 7 x weekly - 3 sets - 10 reps - Standing Isometric Shoulder Abduction with Doorway - Arm Bent  - 1 x daily - 7 x weekly - 3 sets - 10 reps - Standing Isometric Shoulder Extension with Doorway - Arm Bent  - 1 x daily - 7 x weekly - 3 sets - 10 reps - Isometric Shoulder Adduction  - 1 x daily - 7 x weekly - 3 sets - 10 reps  ASSESSMENT:  CLINICAL IMPRESSION: Patient responded well to manual techniques demonstrating much improved ROM after manual therapy. Pt continues to require significant cues for proper muscular activation with rotator cuff musculature but did improve with TC, verbal cues and practice. Pt progressed with UE strength and ROM with less c/o pain this date. Pt will continue to benefit from skilled physical therapy intervention to address impairments, improve QOL, and attain therapy goals.     OBJECTIVE IMPAIRMENTS decreased ROM, decreased strength, impaired UE functional use, and pain.   ACTIVITY LIMITATIONS carrying, lifting, sleeping, bathing, dressing, and reach over head  PARTICIPATION LIMITATIONS: cleaning, laundry, driving, community activity, and  occupation  PERSONAL FACTORS Age, Behavior pattern, Past/current experiences, Profession, Time since onset of injury/illness/exacerbation, and 1-2 comorbidities: arthritis, cancer, and HTN  are also affecting patient's functional outcome.   REHAB POTENTIAL: Good  CLINICAL DECISION MAKING: Stable/uncomplicated  EVALUATION COMPLEXITY: Moderate   GOALS: Goals reviewed with patient? Yes  SHORT TERM GOALS: Target date:  10/25/21  Pt will be independent with HEP in order to demonstrate increased ability to perform tasks related to occupation/hobbies. Baseline: Eval (09/13/21):  Pt given HEP at initial evaluation. Goal status: INITIAL    LONG TERM GOALS: Target date: 12/06/2021    Patient will demonstrate improved function as evidenced by a score of 64 on FOTO measure for full participation in activities at home and in the community. Baseline: Eval (09/13/21): FOTO - 58/64 Goal status: INITIAL  2.  Pt will decrease worst shoulder pain by at least 3 points on the NPRS in order to demonstrate clinically significant reduction in shoulder pain. Baseline: 10/10 at night when resting Goal status: INITIAL  3.  Pt will increase strength by at least 1/2 MMT grade in order to demonstrate improvement in strength and function  Baseline: Gross 4-/5 with painful symptoms in the R shoulder Goal status: INITIAL  4.  Pt will improve ROM to be within 10% of contralateral side in flexion/abduction.  Baseline: R Shoulder Flexion/Abduction - 116/117 deg Goal status: INITIAL   PLAN: PT FREQUENCY: 1-2x/week  PT DURATION: 12 weeks  PLANNED INTERVENTIONS: Therapeutic exercises, Therapeutic activity, Neuromuscular re-education, Balance training, Gait training, Patient/Family education, Self Care, Joint mobilization, and Dry Needling  PLAN FOR NEXT SESSION: Assess compliance with HEP and introduce new exercises as pain allows.  Particia Lather PT  10/06/21, 8:17 AM

## 2021-10-07 DIAGNOSIS — U071 COVID-19: Secondary | ICD-10-CM | POA: Diagnosis not present

## 2021-10-07 DIAGNOSIS — Z03818 Encounter for observation for suspected exposure to other biological agents ruled out: Secondary | ICD-10-CM | POA: Diagnosis not present

## 2021-10-09 ENCOUNTER — Other Ambulatory Visit: Payer: Self-pay

## 2021-10-09 MED ORDER — LATANOPROST 0.005 % OP SOLN
OPHTHALMIC | 5 refills | Status: DC
  Filled 2021-10-09: qty 2.5, 25d supply, fill #0
  Filled 2021-11-03: qty 2.5, 25d supply, fill #1
  Filled 2021-12-07: qty 2.5, 25d supply, fill #2
  Filled 2022-01-26: qty 2.5, 25d supply, fill #3
  Filled 2022-03-14: qty 2.5, 25d supply, fill #4
  Filled 2022-04-13: qty 2.5, 25d supply, fill #5

## 2021-10-11 ENCOUNTER — Ambulatory Visit: Payer: 59 | Admitting: Physical Therapy

## 2021-10-13 ENCOUNTER — Ambulatory Visit: Payer: 59

## 2021-10-16 DIAGNOSIS — I10 Essential (primary) hypertension: Secondary | ICD-10-CM | POA: Diagnosis not present

## 2021-10-16 DIAGNOSIS — Z85528 Personal history of other malignant neoplasm of kidney: Secondary | ICD-10-CM | POA: Diagnosis not present

## 2021-10-16 DIAGNOSIS — E785 Hyperlipidemia, unspecified: Secondary | ICD-10-CM | POA: Diagnosis not present

## 2021-10-16 DIAGNOSIS — Z7982 Long term (current) use of aspirin: Secondary | ICD-10-CM | POA: Diagnosis not present

## 2021-10-16 DIAGNOSIS — Z8673 Personal history of transient ischemic attack (TIA), and cerebral infarction without residual deficits: Secondary | ICD-10-CM | POA: Diagnosis not present

## 2021-10-17 ENCOUNTER — Other Ambulatory Visit: Payer: Self-pay | Admitting: Internal Medicine

## 2021-10-17 ENCOUNTER — Other Ambulatory Visit: Payer: Self-pay

## 2021-10-17 ENCOUNTER — Other Ambulatory Visit (HOSPITAL_COMMUNITY): Payer: Self-pay | Admitting: Internal Medicine

## 2021-10-17 DIAGNOSIS — Z0000 Encounter for general adult medical examination without abnormal findings: Secondary | ICD-10-CM

## 2021-10-17 DIAGNOSIS — Z8673 Personal history of transient ischemic attack (TIA), and cerebral infarction without residual deficits: Secondary | ICD-10-CM | POA: Diagnosis not present

## 2021-10-17 DIAGNOSIS — I10 Essential (primary) hypertension: Secondary | ICD-10-CM | POA: Diagnosis not present

## 2021-10-17 DIAGNOSIS — Z85528 Personal history of other malignant neoplasm of kidney: Secondary | ICD-10-CM | POA: Diagnosis not present

## 2021-10-17 DIAGNOSIS — Z853 Personal history of malignant neoplasm of breast: Secondary | ICD-10-CM | POA: Diagnosis not present

## 2021-10-17 DIAGNOSIS — E785 Hyperlipidemia, unspecified: Secondary | ICD-10-CM | POA: Diagnosis not present

## 2021-10-17 MED ORDER — BENZONATATE 200 MG PO CAPS
ORAL_CAPSULE | ORAL | 0 refills | Status: DC
  Filled 2021-10-17: qty 20, 7d supply, fill #0

## 2021-10-17 MED ORDER — FLUTICASONE PROPIONATE 50 MCG/ACT NA SUSP
1.0000 | Freq: Two times a day (BID) | NASAL | 1 refills | Status: DC
  Filled 2021-10-17: qty 16, 30d supply, fill #0

## 2021-10-18 ENCOUNTER — Ambulatory Visit: Payer: 59

## 2021-10-20 ENCOUNTER — Ambulatory Visit: Payer: 59

## 2021-10-24 ENCOUNTER — Ambulatory Visit: Payer: 59

## 2021-10-25 ENCOUNTER — Ambulatory Visit: Payer: 59 | Attending: Sports Medicine

## 2021-10-25 DIAGNOSIS — M25611 Stiffness of right shoulder, not elsewhere classified: Secondary | ICD-10-CM

## 2021-10-25 DIAGNOSIS — R29898 Other symptoms and signs involving the musculoskeletal system: Secondary | ICD-10-CM

## 2021-10-25 DIAGNOSIS — M25511 Pain in right shoulder: Secondary | ICD-10-CM | POA: Diagnosis not present

## 2021-10-25 DIAGNOSIS — M6281 Muscle weakness (generalized): Secondary | ICD-10-CM

## 2021-10-25 NOTE — Therapy (Signed)
OUTPATIENT PHYSICAL THERAPY SHOULDER treatment    Patient Name: Makayla Rice MRN: [id number] DOB:[birth year], 71 y.o., female Today's Date: 10/26/2021   PT End of Session - 10/25/21 1148     Visit Number 6    Number of Visits 24    Date for PT Re-Evaluation 12/06/21    Authorization Type [employer]    Authorization - Visit Number 4    Authorization - Number of Visits 10    Progress Note Due on Visit 10    PT Start Time 4128    PT Stop Time 1218    PT Time Calculation (min) 33 min    Activity Tolerance Patient tolerated treatment well    Behavior During Therapy Makayla Rice for tasks assessed/performed                Past Medical History:  Diagnosis Date   Arthritis    Breast cancer ([place]) 11/2016   DCIS   Cancer ([place]) 2013   kidney cancer   Complication of anesthesia    History of kidney stones    Hyperlipidemia    Hypertension    Personal history of radiation therapy 2019   F/u right breast ca   PONV (postoperative nausea and vomiting)    AFTER ROBOTIC KIDNEY SURGERY   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   Past Surgical History:  Procedure Laterality Date   ABDOMINAL HYSTERECTOMY     BREAST BIOPSY Right 11/13/2016   Affirm Bx-path DCIS   BREAST EXCISIONAL BIOPSY Right 03/04/2017   lumpectomy wit np DCIS    BREAST LUMPECTOMY Right 02/2017   DCIS   BREAST LUMPECTOMY WITH NEEDLE LOCALIZATION Right 03/04/2017   Procedure: BREAST LUMPECTOMY WITH NEEDLE LOCALIZATION;  Surgeon: Makayla Bellow, MD;  Location: [facility] ORS;  Service: General;  Laterality: Right;   COLONOSCOPY  2018   EYE SURGERY  2003   KIDNEY SURGERY  2013   ROBOTIC SURGERY   PARTIAL NEPHRECTOMY  2013   Patient Active Problem List   Diagnosis Date Noted   Abnormal vaginal Pap smear 09/25/2017   Ductal carcinoma in situ (DCIS) of right breast 11/19/2016   Hypertension 04/16/2016   Hyperlipidemia, unspecified 04/16/2016   Glaucoma 04/16/2016   History of stroke 07/28/2013   History of  renal cell carcinoma 05/27/2012    PCP: Makayla Lab, MD  REFERRING PROVIDER: Rosalia Hammers, DO  REFERRING DIAG:  [phone] (ICD-10-CM) - Pain in right arm  M50.30 (ICD-10-CM) - Other cervical disc degeneration, unspecified cervical region  M19.011 (ICD-10-CM) - Primary osteoarthritis, right shoulder  M47.812 (ICD-10-CM) - Spondylosis without myelopathy or radiculopathy, cervical region    THERAPY DIAG:  Acute pain of right shoulder  Decreased ROM of right shoulder  Shoulder weakness  Muscle weakness (generalized)  Rationale for Evaluation and Treatment Rehabilitation  ONSET DATE: 06/08/2021  SUBJECTIVE:  SUBJECTIVE STATEMENT: . Patient reports shoulder pain comes and goes. She states she has her worst pain at night with sleeping. States able to work and perform job duties but increased pain at times and fatigue. Also reports not back up to par at work secondary to recent Makayla Rice.    PERTINENT HISTORY: Per MD on 09/07/21:  "Makayla Rice is a 70 y.o. female that presents to clinic today for follow up evaluation and management of right arm pain at the referral of Makayla Lab, MD. They were last evaluated by myself on 04/19/2020 where she was having some right neck and shoulder pain after a fall. At that time, the plan was to use home exercise, over-the-counter medicine, then follow-up for reevaluation depending on clinical course. She has not had any issue until the past couple months without acute injury."   PAIN: Are you having pain? No   PRECAUTIONS: None  WEIGHT BEARING RESTRICTIONS No  FALLS:  Has patient fallen in last 6 months? No  LIVING ENVIRONMENT: Lives with: lives alone Lives in: House/apartment Stairs: No Has following equipment at home: None  OCCUPATION: Water engineer at  [employer]  PLOF: Independent  PATIENT GOALS:  To decrease pain, knowing that it will likely not go away completely.  OBJECTIVE: (objective measures completed at initial evaluation unless otherwise dated)   DIAGNOSTIC FINDINGS:  No formal diagnostic imaging performed on the UE.   PATIENT SURVEYS:  FOTO 58/64   COGNITION: Overall cognitive status: Within functional limits for tasks assessed     SENSATION: WFL  POSTURE: Pt has slightly forward head and rolled shoulders.  UPPER EXTREMITY ROM:   Active ROM Right eval Left eval  Shoulder flexion 117 deg 121 deg  Shoulder extension    Shoulder abduction 116 deg 164 deg  Shoulder adduction    Shoulder internal rotation    Shoulder external rotation    Elbow flexion [facility] WFL  Elbow extension [facility] WFL  Wrist flexion [facility] WFL  Wrist extension WFL WFL  (Blank rows = not tested)   UPPER EXTREMITY MMT:  MMT Right eval Left eval  Shoulder flexion *4- 4  Shoulder abduction *4- 4  Elbow flexion *3+ 4  Elbow extension 4 4+  Wrist flexion 4+ 4+  Wrist extension 4+ 4+  (* = denotes pain)   SHOULDER SPECIAL TESTS:  All testing below performed for R shoulder  Impingement tests: Neer impingement test: positive , Hawkins/Kennedy impingement test: negative, and Painful arc test: negative  Rotator cuff assessment: Empty can test: positive , Full can test: positive , External rotation lag sign: positive , and Belly press test: positive   Biceps assessment: Yergason's test: negative and Speed's test: positive    JOINT MOBILITY TESTING:  Pt has increased hypomobility in the R shoulder in AP and Inferior glide assessment.    PALPATION:  Pt has noticeable TP's noted in the R biceps and in R UT.     TODAY'S TREATMENT:  RIGHT SHOULDER   Manual Therapy  STM to biceps with mobilizations, some proximal TP noted =Patient reported feeling much better after STM- and at end of visit - applied STM with biofreeze.   PROM to right  shoulder- All planes- Flex/ext/Abd/Hor abd/ER/IR- mostly  end range soreness with firm end fell - still new  but near Full ROM upon completion.  Grade II-III inf/PA/AP GH joint mobs in varying angles of flex/abd/ER THEREX:  Shoulder isometrics in supine with PT using hands for isometric resistance x 10 ER and IR  -  no pain yet increased VC to perform correctly  Supine serratus punch 2 sets of 10 reps Standing shoulder flex/abd without UT compensation x 12 reps each x 2 sets.  Pt required significant verbal, visual and tactile cues for proper copmletion of the exercises above, pt encouraged to practice between appointments to ensure she will make adequate progress.     PATIENT EDUCATION: Education details: Exercise technique Person educated: Patient Education method: Explanation, Demonstration, Tactile cues, Verbal cues, and Handouts Education comprehension: verbalized understanding, returned demonstration, verbal cues required, tactile cues required, and needs further education   HOME EXERCISE PROGRAM: Access Code: [email or web address] URL: [email or web address] Date: 10/06/2021 Prepared by: Sande Brothers  Exercises - Supine Shoulder Flexion Extension AAROM with Dowel  - 1 x daily - 3 sets - 10 reps - Supine Shoulder Abduction AAROM with Dowel  - 1 x daily - 3 sets - 10 reps - Supine Shoulder External Rotation AAROM with Dowel  - 1 x daily - 3 sets - 10 reps        Access Code: [email or web address] URL: [email or web address] Date: 09/13/2021 Prepared by: [place]  Exercises - Standing Isometric Shoulder Flexion with Doorway - Arm Bent  - 1 x daily - 7 x weekly - 3 sets - 10 reps - Standing Isometric Shoulder Abduction with Doorway - Arm Bent  - 1 x daily - 7 x weekly - 3 sets - 10 reps - Standing Isometric Shoulder Extension with Doorway - Arm Bent  - 1 x daily - 7 x weekly - 3 sets - 10 reps - Isometric Shoulder Adduction  - 1 x daily - 7 x weekly - 3 sets -  10 reps  ASSESSMENT:  CLINICAL IMPRESSION: Patient reported continued relief with STM to left bicep and ROM of right shoulder. Pt continues to present with near full ROM yet difficulty progressing therex - difficulty following cues for UE strengthening including isometrics. She did deny pain today isometrics and remaining exercises. Pt will continue to benefit from skilled physical therapy intervention to address impairments, improve QOL, and attain therapy goals.     OBJECTIVE IMPAIRMENTS decreased ROM, decreased strength, impaired UE functional use, and pain.   ACTIVITY LIMITATIONS carrying, lifting, sleeping, bathing, dressing, and reach over head  PARTICIPATION LIMITATIONS: cleaning, laundry, driving, community activity, and occupation  PERSONAL FACTORS Age, Behavior pattern, Past/current experiences, Profession, Time since onset of injury/illness/exacerbation, and 1-2 comorbidities: arthritis, cancer, and HTN  are also affecting patient's functional outcome.   REHAB POTENTIAL: Good  CLINICAL DECISION MAKING: Stable/uncomplicated  EVALUATION COMPLEXITY: Moderate   GOALS: Goals reviewed with patient? Yes  SHORT TERM GOALS: Target date:  10/25/21  Pt will be independent with HEP in order to demonstrate increased ability to perform tasks related to occupation/hobbies. Baseline: Eval (09/13/21):  Pt given HEP at initial evaluation. Goal status: INITIAL    LONG TERM GOALS: Target date: 12/06/2021    Patient will demonstrate improved function as evidenced by a score of 64 on FOTO measure for full participation in activities at home and in the community. Baseline: Eval (09/13/21): FOTO - 58/64 Goal status: INITIAL  2.  Pt will decrease worst shoulder pain by at least 3 points on the NPRS in order to demonstrate clinically significant reduction in shoulder pain. Baseline: 10/10 at night when resting Goal status: INITIAL  3.  Pt will increase strength by at least 1/2 MMT grade in  order to demonstrate improvement in strength and function  Baseline: Gross 4-/5 with painful symptoms in the R  shoulder Goal status: INITIAL  4.  Pt will improve ROM to be within 10% of contralateral side in flexion/abduction.  Baseline: R Shoulder Flexion/Abduction - 116/117 deg Goal status: INITIAL   PLAN: PT FREQUENCY: 1-2x/week  PT DURATION: 12 weeks  PLANNED INTERVENTIONS: Therapeutic exercises, Therapeutic activity, Neuromuscular re-education, Balance training, Gait training, Patient/Family education, Self Care, Joint mobilization, and Dry Needling  PLAN FOR NEXT SESSION: Assess compliance with HEP and introduce new exercises as pain allows.  Ollen Bowl, PT 10/26/21, 3:05 PM

## 2021-10-27 ENCOUNTER — Ambulatory Visit: Payer: 59

## 2021-10-27 DIAGNOSIS — M25611 Stiffness of right shoulder, not elsewhere classified: Secondary | ICD-10-CM | POA: Diagnosis not present

## 2021-10-27 DIAGNOSIS — M25511 Pain in right shoulder: Secondary | ICD-10-CM | POA: Diagnosis not present

## 2021-10-27 DIAGNOSIS — M6281 Muscle weakness (generalized): Secondary | ICD-10-CM

## 2021-10-27 DIAGNOSIS — R29898 Other symptoms and signs involving the musculoskeletal system: Secondary | ICD-10-CM

## 2021-10-27 NOTE — Therapy (Signed)
OUTPATIENT PHYSICAL THERAPY SHOULDER treatment    Patient Name: Makayla Rice MRN: [id number] DOB:[birth year], 71 y.o., female Today's Date: 10/27/2021   PT End of Session - 10/27/21 1115     Visit Number 7    Number of Visits 24    Date for PT Re-Evaluation 12/06/21    Authorization Type [employer]    Authorization - Visit Number 7    Authorization - Number of Visits 10    Progress Note Due on Visit 10    PT Start Time 0930    PT Stop Time 1012    PT Time Calculation (min) 42 min    Activity Tolerance Patient tolerated treatment well    Behavior During Therapy [facility] for tasks assessed/performed                 Past Medical History:  Diagnosis Date   Arthritis    Breast cancer ([place]) 11/2016   DCIS   Cancer ([place]) 2013   kidney cancer   Complication of anesthesia    History of kidney stones    Hyperlipidemia    Hypertension    Personal history of radiation therapy 2019   F/u right breast ca   PONV (postoperative nausea and vomiting)    AFTER ROBOTIC KIDNEY SURGERY   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   Past Surgical History:  Procedure Laterality Date   ABDOMINAL HYSTERECTOMY     BREAST BIOPSY Right 11/13/2016   Affirm Bx-path DCIS   BREAST EXCISIONAL BIOPSY Right 03/04/2017   lumpectomy wit np DCIS    BREAST LUMPECTOMY Right 02/2017   DCIS   BREAST LUMPECTOMY WITH NEEDLE LOCALIZATION Right 03/04/2017   Procedure: BREAST LUMPECTOMY WITH NEEDLE LOCALIZATION;  Surgeon: Makayla Bellow, MD;  Location: [facility] ORS;  Service: General;  Laterality: Right;   COLONOSCOPY  2018   EYE SURGERY  2003   KIDNEY SURGERY  2013   ROBOTIC SURGERY   PARTIAL NEPHRECTOMY  2013   Patient Active Problem List   Diagnosis Date Noted   Abnormal vaginal Pap smear 09/25/2017   Ductal carcinoma in situ (DCIS) of right breast 11/19/2016   Hypertension 04/16/2016   Hyperlipidemia, unspecified 04/16/2016   Glaucoma 04/16/2016   History of stroke 07/28/2013   History of  renal cell carcinoma 05/27/2012    PCP: Makayla Lab, MD  REFERRING PROVIDER: Rosalia Hammers, DO  REFERRING DIAG:  [phone] (ICD-10-CM) - Pain in right arm  M50.30 (ICD-10-CM) - Other cervical disc degeneration, unspecified cervical region  M19.011 (ICD-10-CM) - Primary osteoarthritis, right shoulder  M47.812 (ICD-10-CM) - Spondylosis without myelopathy or radiculopathy, cervical region    THERAPY DIAG:  Acute pain of right shoulder  Decreased ROM of right shoulder  Shoulder weakness  Muscle weakness (generalized)  Rationale for Evaluation and Treatment Rehabilitation  ONSET DATE: 06/08/2021  SUBJECTIVE:  SUBJECTIVE STATEMENT: . Patient reports feeling better since last visit. States she feels the PT helped loosen her up from being stiffer since having COVID and not using her arm as much. She stated not performing much of HEP as she has been sick recently and now working hard at work. She denied any pain today    PERTINENT HISTORY: Per MD on 09/07/21:  "Makayla Rice is a 71 y.o. female that presents to clinic today for follow up evaluation and management of right arm pain at the referral of Makayla Lab, MD. They were last evaluated by myself on 04/19/2020 where she was having some right neck and shoulder pain after a fall. At that time, the plan was to use home exercise, over-the-counter medicine, then follow-up for reevaluation depending on clinical course. She has not had any issue until the past couple months without acute injury."   PAIN: Are you having pain? No   PRECAUTIONS: None  WEIGHT BEARING RESTRICTIONS No  FALLS:  Has patient fallen in last 6 months? No  LIVING ENVIRONMENT: Lives with: lives alone Lives in: House/apartment Stairs: No Has following equipment at home:  None  OCCUPATION: Water engineer at [employer]  PLOF: Independent  PATIENT GOALS:  To decrease pain, knowing that it will likely not go away completely.  OBJECTIVE: (objective measures completed at initial evaluation unless otherwise dated)   DIAGNOSTIC FINDINGS:  No formal diagnostic imaging performed on the UE.   PATIENT SURVEYS:  FOTO 58/64   COGNITION: Overall cognitive status: Within functional limits for tasks assessed     SENSATION: WFL  POSTURE: Pt has slightly forward head and rolled shoulders.  UPPER EXTREMITY ROM:   Active ROM Right eval Left eval  Shoulder flexion 117 deg 121 deg  Shoulder extension    Shoulder abduction 116 deg 164 deg  Shoulder adduction    Shoulder internal rotation    Shoulder external rotation    Elbow flexion [facility] WFL  Elbow extension [facility] WFL  Wrist flexion [facility] WFL  Wrist extension WFL WFL  (Blank rows = not tested)   UPPER EXTREMITY MMT:  MMT Right eval Left eval  Shoulder flexion *4- 4  Shoulder abduction *4- 4  Elbow flexion *3+ 4  Elbow extension 4 4+  Wrist flexion 4+ 4+  Wrist extension 4+ 4+  (* = denotes pain)   SHOULDER SPECIAL TESTS:  All testing below performed for R shoulder  Impingement tests: Neer impingement test: positive , Hawkins/Kennedy impingement test: negative, and Painful arc test: negative  Rotator cuff assessment: Empty can test: positive , Full can test: positive , External rotation lag sign: positive , and Belly press test: positive   Biceps assessment: Yergason's test: negative and Speed's test: positive    JOINT MOBILITY TESTING:  Pt has increased hypomobility in the R shoulder in AP and Inferior glide assessment.    PALPATION:  Pt has noticeable TP's noted in the R biceps and in R UT.     TODAY'S TREATMENT:  RIGHT SHOULDER   Manual Therapy  STM to biceps with mobilizations, some middle portion of bicep tightness yet noTP noted  PROM to right shoulder- All planes-  Flex/ext/Abd/Hor abd/ER/IR- mostly  Initially sore with end range but improved with reps today and no pain upon completion.  Grade II-III inf/PA/AP GH joint mobs in varying angles of flex/abd/ER Mobilization with movement with [place]- Inf mobs with shoulder elevation toward full ROM.    THEREX:  Shoulder wand ROM- Instructed in shoulder flex/abd/ and ER-  20 reps each  -no pain yet increased VC to perform correctly  Resistive Shoulder ER- RTB with towel roll - 2 sets of 12 reps (no Pain)  Resistive Shoulder IR - RTB with towel roll - 2 sets of 12 reps (no Pain)   Pt required significant verbal, visual and tactile cues for proper copmletion of the exercises above, pt encouraged to practice between appointments to ensure she will make adequate progress.     PATIENT EDUCATION: Education details: Exercise technique Person educated: Patient Education method: Explanation, Demonstration, Tactile cues, Verbal cues, and Handouts Education comprehension: verbalized understanding, returned demonstration, verbal cues required, tactile cues required, and needs further education   HOME EXERCISE PROGRAM:  Access Code: [email or web address] URL: [email or web address] Date: 10/27/2021 Prepared by: Sande Brothers  Exercises - Supine Shoulder Flexion AAROM with Dowel  - 1 x daily - 7 x weekly - 3 sets - 10 reps - Supine Shoulder Abduction AAROM with Dowel  - 1 x daily - 7 x weekly - 3 sets - 10 reps - Supine Shoulder External Rotation with Dowel  - 1 x daily - 7 x weekly - 3 sets - 10 reps - Shoulder External Rotation with Anchored Resistance with Towel Under Elbow  - 1 x daily - 3 x weekly - 3 sets - 10 reps - Shoulder Internal Rotation with Resistance  - 1 x daily - 3 x weekly - 3 sets - 10 reps    Access Code: [email or web address] URL: [email or web address] Date: 10/06/2021 Prepared by: Sande Brothers  Exercises - Supine Shoulder Flexion Extension AAROM with Dowel  - 1 x daily - 3  sets - 10 reps - Supine Shoulder Abduction AAROM with Dowel  - 1 x daily - 3 sets - 10 reps - Supine Shoulder External Rotation AAROM with Dowel  - 1 x daily - 3 sets - 10 reps        Access Code: [email or web address] URL: [email or web address] Date: 09/13/2021 Prepared by: [place]  Exercises - Standing Isometric Shoulder Flexion with Doorway - Arm Bent  - 1 x daily - 7 x weekly - 3 sets - 10 reps - Standing Isometric Shoulder Abduction with Doorway - Arm Bent  - 1 x daily - 7 x weekly - 3 sets - 10 reps - Standing Isometric Shoulder Extension with Doorway - Arm Bent  - 1 x daily - 7 x weekly - 3 sets - 10 reps - Isometric Shoulder Adduction  - 1 x daily - 7 x weekly - 3 sets - 10 reps  ASSESSMENT:  CLINICAL IMPRESSION:   Patient continues to progress well - no pain reported today and patient presenting with near full ROM. She reports work is going well and that she slept better. She responded better today with all manual techniques and able to progress to more resistive shoulder exercises for Rotator cuff strengthening without issues. Added new HEP and issued handout expressing importance of compliance with ROM and strengthening to continue to maximize her potential. Pt will continue to benefit from skilled physical therapy intervention to address impairments, improve QOL, and attain therapy goals.     OBJECTIVE IMPAIRMENTS decreased ROM, decreased strength, impaired UE functional use, and pain.   ACTIVITY LIMITATIONS carrying, lifting, sleeping, bathing, dressing, and reach over head  PARTICIPATION LIMITATIONS: cleaning, laundry, driving, community activity, and occupation  PERSONAL FACTORS Age, Behavior pattern, Past/current experiences, Profession, Time since onset of injury/illness/exacerbation, and 1-2 comorbidities: arthritis, cancer, and HTN  are also affecting patient's functional outcome.   REHAB  POTENTIAL: Good  CLINICAL DECISION MAKING:  Stable/uncomplicated  EVALUATION COMPLEXITY: Moderate   GOALS: Goals reviewed with patient? Yes  SHORT TERM GOALS: Target date:  10/25/21  Pt will be independent with HEP in order to demonstrate increased ability to perform tasks related to occupation/hobbies. Baseline: Eval (09/13/21):  Pt given HEP at initial evaluation. Goal status: INITIAL    LONG TERM GOALS: Target date: 12/06/2021    Patient will demonstrate improved function as evidenced by a score of 64 on FOTO measure for full participation in activities at home and in the community. Baseline: Eval (09/13/21): FOTO - 58/64 Goal status: INITIAL  2.  Pt will decrease worst shoulder pain by at least 3 points on the NPRS in order to demonstrate clinically significant reduction in shoulder pain. Baseline: 10/10 at night when resting Goal status: INITIAL  3.  Pt will increase strength by at least 1/2 MMT grade in order to demonstrate improvement in strength and function  Baseline: Gross 4-/5 with painful symptoms in the R shoulder Goal status: INITIAL  4.  Pt will improve ROM to be within 10% of contralateral side in flexion/abduction.  Baseline: R Shoulder Flexion/Abduction - 116/117 deg Goal status: INITIAL   PLAN: PT FREQUENCY: 1-2x/week  PT DURATION: 12 weeks  PLANNED INTERVENTIONS: Therapeutic exercises, Therapeutic activity, Neuromuscular re-education, Balance training, Gait training, Patient/Family education, Self Care, Joint mobilization, and Dry Needling  PLAN FOR NEXT SESSION: Assess compliance with HEP and introduce new exercises as pain allows.  Ollen Bowl, PT 10/27/21, 11:25 AM

## 2021-10-31 ENCOUNTER — Ambulatory Visit
Admission: RE | Admit: 2021-10-31 | Discharge: 2021-10-31 | Disposition: A | Discharge status: Home / Self Care | Payer: 59 | Source: Ambulatory Visit | Attending: Internal Medicine | Admitting: Internal Medicine

## 2021-10-31 DIAGNOSIS — Z905 Acquired absence of kidney: Secondary | ICD-10-CM | POA: Diagnosis not present

## 2021-10-31 DIAGNOSIS — Z0000 Encounter for general adult medical examination without abnormal findings: Secondary | ICD-10-CM | POA: Diagnosis not present

## 2021-10-31 DIAGNOSIS — Z85528 Personal history of other malignant neoplasm of kidney: Secondary | ICD-10-CM | POA: Insufficient documentation

## 2021-11-03 ENCOUNTER — Other Ambulatory Visit: Payer: Self-pay

## 2021-11-03 ENCOUNTER — Ambulatory Visit: Payer: 59

## 2021-11-03 DIAGNOSIS — M25511 Pain in right shoulder: Secondary | ICD-10-CM | POA: Diagnosis not present

## 2021-11-03 DIAGNOSIS — R29898 Other symptoms and signs involving the musculoskeletal system: Secondary | ICD-10-CM | POA: Diagnosis not present

## 2021-11-03 DIAGNOSIS — M25611 Stiffness of right shoulder, not elsewhere classified: Secondary | ICD-10-CM

## 2021-11-03 DIAGNOSIS — M6281 Muscle weakness (generalized): Secondary | ICD-10-CM | POA: Diagnosis not present

## 2021-11-03 NOTE — Therapy (Signed)
OUTPATIENT PHYSICAL THERAPY SHOULDER treatment    Patient Name: Makayla Rice MRN: [id number] DOB:18-Oct-1950, 71 y.o., female Today's Date: 11/03/2021   PT End of Session - 11/03/21 0937     Visit Number 8    Number of Visits 24    Date for PT Re-Evaluation 12/06/21    Authorization Type  EMPLOYEE    Authorization - Visit Number 8    Authorization - Number of Visits 10    Progress Note Due on Visit 10    PT Start Time 0933    PT Stop Time 1004    PT Time Calculation (min) 31 min    Activity Tolerance Patient tolerated treatment well    Behavior During Therapy [facility] for tasks assessed/performed                  Past Medical History:  Diagnosis Date   Arthritis    Breast cancer (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   Past Surgical History:  Procedure Laterality Date   ABDOMINAL HYSTERECTOMY     BREAST BIOPSY Right 11/13/2016   Affirm Bx-path DCIS   BREAST EXCISIONAL BIOPSY Right 03/04/2017   lumpectomy wit np DCIS    BREAST LUMPECTOMY Right 02/2017   DCIS   BREAST LUMPECTOMY WITH NEEDLE LOCALIZATION Right 03/04/2017   Procedure: BREAST LUMPECTOMY WITH NEEDLE LOCALIZATION;  Surgeon: [name], MD;  Location: [facility] ORS;  Service: General;  Laterality: Right;   COLONOSCOPY  2018   EYE SURGERY  2003   KIDNEY SURGERY  2013   ROBOTIC SURGERY   PARTIAL NEPHRECTOMY  2013   Patient Active Problem List   Diagnosis Date Noted   Abnormal vaginal Pap smear 09/25/2017   Ductal carcinoma in situ (DCIS) of right breast 11/19/2016   Hypertension 04/16/2016   Hyperlipidemia, unspecified 04/16/2016   Glaucoma 04/16/2016   History of stroke 07/28/2013   History of  renal cell carcinoma 05/27/2012    PCP: Ramonita Lab, MD  REFERRING PROVIDER: Rosalia Hammers, DO  REFERRING DIAG:  [id number] (ICD-10-CM) - Pain in right arm  M50.30 (ICD-10-CM) - Other cervical disc degeneration, unspecified cervical region  M19.011 (ICD-10-CM) - Primary osteoarthritis, right shoulder  M47.812 (ICD-10-CM) - Spondylosis without myelopathy or radiculopathy, cervical region    THERAPY DIAG:  Acute pain of right shoulder  Decreased ROM of right shoulder  Shoulder weakness  Muscle weakness (generalized)  Rationale for Evaluation and Treatment Rehabilitation  ONSET DATE: 06/08/2021  SUBJECTIVE:  SUBJECTIVE STATEMENT: Patient reports the arm is what it is. Doing okay- stiff and sore but I am sleeping better and not waking up.   PERTINENT HISTORY: Per MD on 09/07/21:  "Makayla Rice is a 71 y.o. female that presents to clinic today for follow up evaluation and management of right arm pain at the referral of Ramonita Lab, MD. They were last evaluated by myself on 04/19/2020 where she was having some right neck and shoulder pain after a fall. At that time, the plan was to use home exercise, over-the-counter medicine, then follow-up for reevaluation depending on clinical course. She has not had any issue until the past couple months without acute injury."   PAIN: Are you having pain? No   PRECAUTIONS: None  WEIGHT BEARING RESTRICTIONS No  FALLS:  Has patient fallen in last 6 months? No  LIVING ENVIRONMENT: Lives with: lives alone Lives in: House/apartment Stairs: No Has following equipment at home: None  OCCUPATION: Water engineer at [employer]  PLOF: Independent  PATIENT GOALS:  To decrease pain, knowing that it will likely not go away completely.  OBJECTIVE: (objective measures  completed at initial evaluation unless otherwise dated)   DIAGNOSTIC FINDINGS:  No formal diagnostic imaging performed on the UE.   PATIENT SURVEYS:  FOTO 58/64   COGNITION: Overall cognitive status: Within functional limits for tasks assessed     SENSATION: WFL  POSTURE: Pt has slightly forward head and rolled shoulders.  UPPER EXTREMITY ROM:   Active ROM Right eval Left eval  Shoulder flexion 117 deg 121 deg  Shoulder extension    Shoulder abduction 116 deg 164 deg  Shoulder adduction    Shoulder internal rotation    Shoulder external rotation    Elbow flexion [facility] WFL  Elbow extension [facility] WFL  Wrist flexion [facility] WFL  Wrist extension WFL WFL  (Blank rows = not tested)   UPPER EXTREMITY MMT:  MMT Right eval Left eval  Shoulder flexion *4- 4  Shoulder abduction *4- 4  Elbow flexion *3+ 4  Elbow extension 4 4+  Wrist flexion 4+ 4+  Wrist extension 4+ 4+  (* = denotes pain)   SHOULDER SPECIAL TESTS:  All testing below performed for R shoulder  Impingement tests: Neer impingement test: positive , Hawkins/Kennedy impingement test: negative, and Painful arc test: negative  Rotator cuff assessment: Empty can test: positive , Full can test: positive , External rotation lag sign: positive , and Belly press test: positive   Biceps assessment: Yergason's test: negative and Speed's test: positive    JOINT MOBILITY TESTING:  Pt has increased hypomobility in the R shoulder in AP and Inferior glide assessment.    PALPATION:  Pt has noticeable TP's noted in the R biceps and in R UT.     TODAY'S TREATMENT:  RIGHT SHOULDER    THEREX:  Bicep curl (matrix cable) 2.5 lb 2 sets of 12 reps (Repetitive- VC for proper form)  Tricep curl (matrix cable) 7.5 lb 2 sets of 12 reps  Scap retraction with BTB 2 sets of 12 reps Shoulder Flex with RTB- 2 sets of 12 reps Shoulder ext with BTB- 2 sets of 12 reps Resistive Shoulder ER- TB with towel roll using 2.5 lb - 2 sets  of 12 reps (no Pain)  Resistive Shoulder IR - RTB with towel roll - 2 sets of 12 reps (no Pain)   Pt required significant verbal, visual and tactile cues for proper copmletion of the exercises above, pt encouraged to practice  between appointments to ensure she will make adequate progress.     PATIENT EDUCATION: Education details: Exercise technique Person educated: Patient Education method: Explanation, Demonstration, Tactile cues, Verbal cues, and Handouts Education comprehension: verbalized understanding, returned demonstration, verbal cues required, tactile cues required, and needs further education   HOME EXERCISE PROGRAM:  Access Code: [email or web address] URL: [email or web address] Date: 10/27/2021 Prepared by: Sande Brothers  Exercises - Supine Shoulder Flexion AAROM with Dowel  - 1 x daily - 7 x weekly - 3 sets - 10 reps - Supine Shoulder Abduction AAROM with Dowel  - 1 x daily - 7 x weekly - 3 sets - 10 reps - Supine Shoulder External Rotation with Dowel  - 1 x daily - 7 x weekly - 3 sets - 10 reps - Shoulder External Rotation with Anchored Resistance with Towel Under Elbow  - 1 x daily - 3 x weekly - 3 sets - 10 reps - Shoulder Internal Rotation with Resistance  - 1 x daily - 3 x weekly - 3 sets - 10 reps    Access Code: [email or web address] URL: [email or web address] Date: 10/06/2021 Prepared by: Sande Brothers  Exercises - Supine Shoulder Flexion Extension AAROM with Dowel  - 1 x daily - 3 sets - 10 reps - Supine Shoulder Abduction AAROM with Dowel  - 1 x daily - 3 sets - 10 reps - Supine Shoulder External Rotation AAROM with Dowel  - 1 x daily - 3 sets - 10 reps        Access Code: [email or web address] URL: [email or web address] Date: 09/13/2021 Prepared by: Martin Nation  Exercises - Standing Isometric Shoulder Flexion with Doorway - Arm Bent  - 1 x daily - 7 x weekly - 3 sets - 10 reps - Standing Isometric Shoulder Abduction with  Doorway - Arm Bent  - 1 x daily - 7 x weekly - 3 sets - 10 reps - Standing Isometric Shoulder Extension with Doorway - Arm Bent  - 1 x daily - 7 x weekly - 3 sets - 10 reps - Isometric Shoulder Adduction  - 1 x daily - 7 x weekly - 3 sets - 10 reps  ASSESSMENT:  CLINICAL IMPRESSION:   Patient continues to progress well during today's session. She was able to progress with more resistance and progressive Rotator/UE strengthening without report of any bicep or anterior shoulder pain. Pt will continue to benefit from skilled physical therapy intervention to address impairments, improve QOL, and attain therapy goals.     OBJECTIVE IMPAIRMENTS decreased ROM, decreased strength, impaired UE functional use, and pain.   ACTIVITY LIMITATIONS carrying, lifting, sleeping, bathing, dressing, and reach over head  PARTICIPATION LIMITATIONS: cleaning, laundry, driving, community activity, and occupation  PERSONAL FACTORS Age, Behavior pattern, Past/current experiences, Profession, Time since onset of injury/illness/exacerbation, and 1-2 comorbidities: arthritis, cancer, and HTN  are also affecting patient's functional outcome.   REHAB POTENTIAL: Good  CLINICAL DECISION MAKING: Stable/uncomplicated  EVALUATION COMPLEXITY: Moderate   GOALS: Goals reviewed with patient? Yes  SHORT TERM GOALS: Target date:  10/25/21  Pt will be independent with HEP in order to demonstrate increased ability to perform tasks related to occupation/hobbies. Baseline: Eval (09/13/21):  Pt given HEP at initial evaluation. Goal status: INITIAL    LONG TERM GOALS: Target date: 12/06/2021    Patient will demonstrate improved function as evidenced by a score of 64 on FOTO measure for full participation in activities at home and in the community. Baseline: Eval (09/13/21): FOTO - 58/64 Goal status: INITIAL  2.  Pt will decrease worst shoulder pain by at least 3 points on the NPRS in order to demonstrate clinically  significant reduction in shoulder pain. Baseline: 10/10 at night when resting Goal status: INITIAL  3.  Pt will increase strength by at least 1/2 MMT grade in order to demonstrate improvement in strength and function  Baseline: Gross 4-/5 with painful symptoms in the R shoulder Goal status: INITIAL  4.  Pt will improve ROM to be within 10% of contralateral side in flexion/abduction.  Baseline: R Shoulder Flexion/Abduction - 116/117 deg Goal status: INITIAL   PLAN: PT FREQUENCY: 1-2x/week  PT DURATION: 12 weeks  PLANNED INTERVENTIONS: Therapeutic exercises, Therapeutic activity, Neuromuscular re-education, Balance training, Gait training, Patient/Family education, Self Care, Joint mobilization, and Dry Needling  PLAN FOR NEXT SESSION: Continue with progressing with UE strengthening  Ollen Bowl, PT 11/03/21, 11:31 AM

## 2021-11-08 ENCOUNTER — Ambulatory Visit: Payer: 59

## 2021-11-10 ENCOUNTER — Ambulatory Visit: Payer: 59 | Attending: Sports Medicine

## 2021-11-10 DIAGNOSIS — M25611 Stiffness of right shoulder, not elsewhere classified: Secondary | ICD-10-CM

## 2021-11-10 DIAGNOSIS — M25511 Pain in right shoulder: Secondary | ICD-10-CM | POA: Diagnosis not present

## 2021-11-10 DIAGNOSIS — M6281 Muscle weakness (generalized): Secondary | ICD-10-CM

## 2021-11-10 DIAGNOSIS — R29898 Other symptoms and signs involving the musculoskeletal system: Secondary | ICD-10-CM | POA: Diagnosis not present

## 2021-11-10 NOTE — Therapy (Signed)
OUTPATIENT PHYSICAL THERAPY SHOULDER treatment    Patient Name: Makayla Rice MRN: [id number] DOB:[birth year], 71 y.o., female Today's Date: 11/10/2021   PT End of Session - 11/10/21 0934     Visit Number 9    Number of Visits 24    Date for PT Re-Evaluation 12/06/21    Authorization Type [place] EMPLOYEE    Authorization - Visit Number 9    Authorization - Number of Visits 10    Progress Note Due on Visit 10    PT Start Time 0933    PT Stop Time 1003    PT Time Calculation (min) 30 min    Activity Tolerance Patient tolerated treatment well    Behavior During Therapy [facility] for tasks assessed/performed                  Past Medical History:  Diagnosis Date   Arthritis    Breast cancer ([place]) 11/2016   DCIS   Cancer ([place]) 2013   kidney cancer   Complication of anesthesia    History of kidney stones    Hyperlipidemia    Hypertension    Personal history of radiation therapy 2019   F/u right breast ca   PONV (postoperative nausea and vomiting)    AFTER ROBOTIC KIDNEY SURGERY   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   Past Surgical History:  Procedure Laterality Date   ABDOMINAL HYSTERECTOMY     BREAST BIOPSY Right 11/13/2016   Affirm Bx-path DCIS   BREAST EXCISIONAL BIOPSY Right 03/04/2017   lumpectomy wit np DCIS    BREAST LUMPECTOMY Right 02/2017   DCIS   BREAST LUMPECTOMY WITH NEEDLE LOCALIZATION Right 03/04/2017   Procedure: BREAST LUMPECTOMY WITH NEEDLE LOCALIZATION;  Surgeon: Makayla Bellow, MD;  Location: [facility] ORS;  Service: General;  Laterality: Right;   COLONOSCOPY  2018   EYE SURGERY  2003   KIDNEY SURGERY  2013   ROBOTIC SURGERY   PARTIAL NEPHRECTOMY  2013   Patient Active Problem List   Diagnosis Date Noted   Abnormal vaginal Pap smear 09/25/2017   Ductal carcinoma in situ (DCIS) of right breast 11/19/2016   Hypertension 04/16/2016   Hyperlipidemia, unspecified 04/16/2016   Glaucoma 04/16/2016   History of stroke 07/28/2013   History of  renal cell carcinoma 05/27/2012    PCP: Makayla Lab, MD  REFERRING PROVIDER: Rosalia Hammers, DO  REFERRING DIAG:  [phone] (ICD-10-CM) - Pain in right arm  M50.30 (ICD-10-CM) - Other cervical disc degeneration, unspecified cervical region  M19.011 (ICD-10-CM) - Primary osteoarthritis, right shoulder  M47.812 (ICD-10-CM) - Spondylosis without myelopathy or radiculopathy, cervical region    THERAPY DIAG:  Acute pain of right shoulder  Decreased ROM of right shoulder  Shoulder weakness  Muscle weakness (generalized)  Rationale for Evaluation and Treatment Rehabilitation  ONSET DATE: 06/08/2021  SUBJECTIVE:  SUBJECTIVE STATEMENT: Patient reports doing well overall- States able to sleep.   PERTINENT HISTORY: Per MD on 09/07/21:  "Makayla Rice is a 71 y.o. female that presents to clinic today for follow up evaluation and management of right arm pain at the referral of Makayla Lab, MD. They were last evaluated by myself on 04/19/2020 where she was having some right neck and shoulder pain after a fall. At that time, the plan was to use home exercise, over-the-counter medicine, then follow-up for reevaluation depending on clinical course. She has not had any issue until the past couple months without acute injury."   PAIN: Are you having pain? No   PRECAUTIONS: None  WEIGHT BEARING RESTRICTIONS No  FALLS:  Has patient fallen in last 6 months? No  LIVING ENVIRONMENT: Lives with: lives alone Lives in: House/apartment Stairs: No Has following equipment at home: None  OCCUPATION: Water engineer at [employer]  PLOF: Independent  PATIENT GOALS:  To decrease pain, knowing that it will likely not go away completely.  OBJECTIVE: (objective measures completed at initial evaluation unless otherwise  dated)   DIAGNOSTIC FINDINGS:  No formal diagnostic imaging performed on the UE.   PATIENT SURVEYS:  FOTO 58/64   COGNITION: Overall cognitive status: Within functional limits for tasks assessed     SENSATION: WFL  POSTURE: Pt has slightly forward head and rolled shoulders.  UPPER EXTREMITY ROM:   Active ROM Right eval Left eval  Shoulder flexion 117 deg 121 deg  Shoulder extension    Shoulder abduction 116 deg 164 deg  Shoulder adduction    Shoulder internal rotation    Shoulder external rotation    Elbow flexion [facility] WFL  Elbow extension [facility] WFL  Wrist flexion [facility] WFL  Wrist extension WFL WFL  (Blank rows = not tested)   UPPER EXTREMITY MMT:  MMT Right eval Left eval  Shoulder flexion *4- 4  Shoulder abduction *4- 4  Elbow flexion *3+ 4  Elbow extension 4 4+  Wrist flexion 4+ 4+  Wrist extension 4+ 4+  (* = denotes pain)   SHOULDER SPECIAL TESTS:  All testing below performed for R shoulder  Impingement tests: Neer impingement test: positive , Hawkins/Kennedy impingement test: negative, and Painful arc test: negative  Rotator cuff assessment: Empty can test: positive , Full can test: positive , External rotation lag sign: positive , and Belly press test: positive   Biceps assessment: Yergason's test: negative and Speed's test: positive    JOINT MOBILITY TESTING:  Pt has increased hypomobility in the R shoulder in AP and Inferior glide assessment.    PALPATION:  Pt has noticeable TP's noted in the R biceps and in R UT.     TODAY'S TREATMENT:  RIGHT SHOULDER    THEREX:   UE Ranger- multiple angles of movement- Horizontal Sh Add/abd/Flex/Scaption x 2 min Bicep curl- YTB 2 sets of 12 reps - VC for proper technique with use of bars  Horizontal shoulder abd- YTB- 2 sets of 12 reps (patient reports as tiring)  Scap retraction with BTB 2 sets of 12 reps Shoulder Flex with YTB- 2 sets of 12 reps Shoulder ext with YTB- 2 sets of 12 reps Shoulder  ABD with YTB - 2 sets of 12 reps Resistive Shoulder ER- TB with towel roll using YTB-2 sets of 12 reps (no Pain)  Resistive Shoulder IR - RTB with towel roll -YTB- 2 sets of 12 reps (no Pain)   Pt required significant verbal, visual and tactile cues for  proper copmletion of the exercises above, pt encouraged to practice between appointments to ensure she will make adequate progress.     PATIENT EDUCATION: Education details: Exercise technique Person educated: Patient Education method: Explanation, Demonstration, Tactile cues, Verbal cues, and Handouts Education comprehension: verbalized understanding, returned demonstration, verbal cues required, tactile cues required, and needs further education   HOME EXERCISE PROGRAM:  Access Code: [email or web address] URL: [email or web address] Date: 10/27/2021 Prepared by: Sande Brothers  Exercises - Supine Shoulder Flexion AAROM with Dowel  - 1 x daily - 7 x weekly - 3 sets - 10 reps - Supine Shoulder Abduction AAROM with Dowel  - 1 x daily - 7 x weekly - 3 sets - 10 reps - Supine Shoulder External Rotation with Dowel  - 1 x daily - 7 x weekly - 3 sets - 10 reps - Shoulder External Rotation with Anchored Resistance with Towel Under Elbow  - 1 x daily - 3 x weekly - 3 sets - 10 reps - Shoulder Internal Rotation with Resistance  - 1 x daily - 3 x weekly - 3 sets - 10 reps    Access Code: [email or web address] URL: [email or web address] Date: 10/06/2021 Prepared by: Sande Brothers  Exercises - Supine Shoulder Flexion Extension AAROM with Dowel  - 1 x daily - 3 sets - 10 reps - Supine Shoulder Abduction AAROM with Dowel  - 1 x daily - 3 sets - 10 reps - Supine Shoulder External Rotation AAROM with Dowel  - 1 x daily - 3 sets - 10 reps        Access Code: [email or web address] URL: [email or web address] Date: 09/13/2021 Prepared by: [place]  Exercises - Standing Isometric Shoulder Flexion with Doorway - Arm Bent   - 1 x daily - 7 x weekly - 3 sets - 10 reps - Standing Isometric Shoulder Abduction with Doorway - Arm Bent  - 1 x daily - 7 x weekly - 3 sets - 10 reps - Standing Isometric Shoulder Extension with Doorway - Arm Bent  - 1 x daily - 7 x weekly - 3 sets - 10 reps - Isometric Shoulder Adduction  - 1 x daily - 7 x weekly - 3 sets - 10 reps  ASSESSMENT:  CLINICAL IMPRESSION:   Patient presents with good motivation and no report of pain before/during/or immediately following PT. She continues to require instruction for correctly using TB. Instructed to tie the band to something stable and reviewed RC exercises and later instructed to place one end of band under her foot for anything overhead. These cues seemed to help. Patient is approaching goals and will fully reassess next visit with plan to potentially discharge if goals met next visit. Patient verbalized understanding of plan. Pt will continue to benefit from skilled physical therapy intervention to address impairments, improve QOL, and attain therapy goals.     OBJECTIVE IMPAIRMENTS decreased ROM, decreased strength, impaired UE functional use, and pain.   ACTIVITY LIMITATIONS carrying, lifting, sleeping, bathing, dressing, and reach over head  PARTICIPATION LIMITATIONS: cleaning, laundry, driving, community activity, and occupation  PERSONAL FACTORS Age, Behavior pattern, Past/current experiences, Profession, Time since onset of injury/illness/exacerbation, and 1-2 comorbidities: arthritis, cancer, and HTN  are also affecting patient's functional outcome.   REHAB POTENTIAL: Good  CLINICAL DECISION MAKING: Stable/uncomplicated  EVALUATION COMPLEXITY: Moderate   GOALS: Goals reviewed with patient? Yes  SHORT TERM GOALS: Target date:  10/25/21  Pt will be independent with HEP in order to demonstrate increased ability to perform tasks related to occupation/hobbies. Baseline:  Eval (09/13/21):  Pt given HEP at initial evaluation. Goal  status: INITIAL    LONG TERM GOALS: Target date: 12/06/2021    Patient will demonstrate improved function as evidenced by a score of 64 on FOTO measure for full participation in activities at home and in the community. Baseline: Eval (09/13/21): FOTO - 58/64 Goal status: INITIAL  2.  Pt will decrease worst shoulder pain by at least 3 points on the NPRS in order to demonstrate clinically significant reduction in shoulder pain. Baseline: 10/10 at night when resting Goal status: INITIAL  3.  Pt will increase strength by at least 1/2 MMT grade in order to demonstrate improvement in strength and function  Baseline: Gross 4-/5 with painful symptoms in the R shoulder Goal status: INITIAL  4.  Pt will improve ROM to be within 10% of contralateral side in flexion/abduction.  Baseline: R Shoulder Flexion/Abduction - 116/117 deg Goal status: INITIAL   PLAN: PT FREQUENCY: 1-2x/week  PT DURATION: 12 weeks  PLANNED INTERVENTIONS: Therapeutic exercises, Therapeutic activity, Neuromuscular re-education, Balance training, Gait training, Patient/Family education, Self Care, Joint mobilization, and Dry Needling  PLAN FOR NEXT SESSION: Continue with progressing with UE strengthening  Ollen Bowl, PT 11/10/21, 10:10 AM

## 2021-11-17 ENCOUNTER — Ambulatory Visit: Payer: 59

## 2021-11-17 DIAGNOSIS — R29898 Other symptoms and signs involving the musculoskeletal system: Secondary | ICD-10-CM

## 2021-11-17 DIAGNOSIS — M25511 Pain in right shoulder: Secondary | ICD-10-CM | POA: Diagnosis not present

## 2021-11-17 DIAGNOSIS — M6281 Muscle weakness (generalized): Secondary | ICD-10-CM | POA: Diagnosis not present

## 2021-11-17 DIAGNOSIS — M25611 Stiffness of right shoulder, not elsewhere classified: Secondary | ICD-10-CM

## 2021-11-17 NOTE — Therapy (Signed)
OUTPATIENT PHYSICAL THERAPY SHOULDER TREATMENT/Physical Therapy Progress Note/Discharge Summary   Dates of reporting period  09/13/2021   to  11/17/2021    Patient Name: Makayla Rice MRN: [id number] DOB:[birth year], 71 y.o., female Today's Date: 11/17/2021   PT End of Session - 11/17/21 0934     Visit Number 10    Number of Visits 24    Date for PT Re-Evaluation 12/06/21    Authorization Type 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   Cancer ([place]) 2013   kidney cancer   Complication of anesthesia    History of kidney stones    Hyperlipidemia    Hypertension    Personal history of radiation therapy 2019   F/u right breast ca   PONV (postoperative nausea and vomiting)    AFTER ROBOTIC KIDNEY SURGERY   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   Past Surgical History:  Procedure Laterality Date   ABDOMINAL HYSTERECTOMY     BREAST BIOPSY Right 11/13/2016   Affirm Bx-path DCIS   BREAST EXCISIONAL BIOPSY Right 03/04/2017   lumpectomy wit np DCIS    BREAST LUMPECTOMY Right 02/2017   DCIS   BREAST LUMPECTOMY WITH NEEDLE LOCALIZATION Right 03/04/2017   Procedure: BREAST LUMPECTOMY WITH NEEDLE LOCALIZATION;  Surgeon: [name], MD;  Location: [facility] ORS;  Service: General;  Laterality: Right;   COLONOSCOPY  2018   EYE SURGERY  2003   KIDNEY SURGERY  2013   ROBOTIC SURGERY   PARTIAL NEPHRECTOMY  2013   Patient Active Problem List   Diagnosis Date Noted   Abnormal vaginal Pap smear 09/25/2017   Ductal carcinoma in situ (DCIS) of right breast 11/19/2016   Hypertension 04/16/2016    Hyperlipidemia, unspecified 04/16/2016   Glaucoma 04/16/2016   History of stroke 07/28/2013   History of renal cell carcinoma 05/27/2012    PCP: Ramonita Lab, MD  REFERRING PROVIDER: Rosalia Hammers, DO  REFERRING DIAG:  [phone] (ICD-10-CM) - Pain in right arm  M50.30 (ICD-10-CM) - Other cervical disc degeneration, unspecified cervical region  M19.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 States she has been doing her exercises more faithfully   PERTINENT HISTORY: Per MD on 09/07/21:  "Makayla Rice is a 71 y.o. female that presents to clinic today for follow up evaluation and management of right arm pain at the referral of Ramonita Lab, MD. They were last evaluated by myself on 04/19/2020 where she was having some right neck and shoulder pain after a fall. At that time, the plan was to use home exercise, over-the-counter medicine, then follow-up for reevaluation depending on clinical course. She has not had any issue until the past couple months without acute injury."   PAIN: Are you having pain? No   PRECAUTIONS: None  WEIGHT BEARING RESTRICTIONS No  FALLS:  Has patient fallen in last 6 months? No  LIVING ENVIRONMENT: Lives with: lives alone Lives in: House/apartment Stairs: No Has following equipment at home: None  OCCUPATION: Water engineer at [employer]  PLOF: Independent  PATIENT  GOALS:  To decrease pain, knowing that it will likely not go away completely.  OBJECTIVE: (objective measures completed at initial evaluation unless otherwise dated)   DIAGNOSTIC FINDINGS:  No formal diagnostic imaging performed on the UE.   PATIENT SURVEYS:  FOTO 58/64   COGNITION: Overall cognitive status: Within functional limits for tasks assessed     SENSATION: WFL  POSTURE: Pt has slightly forward head and rolled shoulders.  UPPER EXTREMITY ROM:   Active ROM Right eval Left eval  Shoulder flexion 117 deg 121 deg  Shoulder extension    Shoulder abduction 116 deg 164 deg  Shoulder adduction    Shoulder internal rotation    Shoulder external rotation    Elbow flexion [facility] WFL  Elbow extension [facility] WFL  Wrist flexion [facility] WFL  Wrist extension WFL WFL  (Blank rows = not tested)   UPPER EXTREMITY MMT:  MMT Right eval Left eval  Shoulder flexion *4- 4  Shoulder abduction *4- 4  Elbow flexion *3+ 4  Elbow extension 4 4+  Wrist flexion 4+ 4+  Wrist extension 4+ 4+  (* = denotes pain)   SHOULDER SPECIAL TESTS:  All testing below performed for R shoulder  Impingement tests: Neer impingement test: positive , Hawkins/Kennedy impingement test: negative, and Painful arc test: negative  Rotator cuff assessment: Empty can test: positive , Full can test: positive , External rotation lag sign: positive , and Belly press test: positive   Biceps assessment: Yergason's test: negative and Speed's test: positive    JOINT MOBILITY TESTING:  Pt has increased hypomobility in the R shoulder in AP and Inferior glide assessment.    PALPATION:  Pt has noticeable TP's noted in the R biceps and in R UT.     TODAY'S TREATMENT:  RIGHT SHOULDER - Reassessed all goals for discharge visit today.    THEREX:   UE - Right shoulder active mobility with hand on washcloth- moving into overhead forward/horizontal ABD and scaption-  2 min Bicep curl- RTB  x 10 to demo good  understanding  Scap retraction -RTB x 10 reps Shoulder Flex with YTB- 10 reps Shoulder ext with YTB- 10 reps Shoulder ABD with YTB - 10 reps Resistive Shoulder ER- TB with towel roll using YTB-10 reps Resistive Shoulder IR - RTB with towel roll -YTB- 10 reps    Pt required significant verbal, visual and tactile cues for proper copmletion of the exercises above, pt encouraged to practice between appointments to ensure she will  make adequate progress.     PATIENT EDUCATION: Education details: Exercise technique Person educated: Patient Education method: Explanation, Demonstration, Tactile cues, Verbal cues, and Handouts Education comprehension: verbalized understanding, returned demonstration, verbal cues required, tactile cues required, and needs further education   HOME EXERCISE PROGRAM:  Access Code: [email or web address] URL: [email or web address] Date: 10/27/2021 Prepared by: Sande Brothers  Exercises - Supine Shoulder Flexion AAROM with Dowel  - 1 x daily - 7 x weekly - 3 sets - 10 reps - Supine Shoulder Abduction AAROM with Dowel  - 1 x daily - 7 x weekly - 3 sets - 10 reps - Supine Shoulder External Rotation with Dowel  - 1 x daily - 7 x weekly - 3 sets - 10 reps - Shoulder External Rotation with Anchored Resistance with Towel Under Elbow  - 1 x daily - 3 x weekly - 3 sets - 10 reps - Shoulder Internal Rotation with Resistance  - 1 x daily - 3 x weekly - 3 sets - 10 reps    Access Code: [email or web address] URL: [email or web address] Date: 10/06/2021 Prepared by: Sande Brothers  Exercises - Supine Shoulder Flexion Extension AAROM with Dowel  - 1 x daily - 3 sets - 10 reps - Supine Shoulder Abduction AAROM with Dowel  - 1 x daily - 3 sets - 10 reps - Supine Shoulder External Rotation AAROM with Dowel  - 1 x daily - 3 sets - 10 reps        Access Code: [email or web address] URL: [email or web address] Date: 09/13/2021 Prepared by: [place]  Exercises - Standing Isometric Shoulder Flexion with Doorway - Arm Bent  - 1 x daily - 7 x weekly - 3 sets - 10 reps - Standing Isometric Shoulder Abduction with Doorway - Arm Bent  - 1 x daily - 7 x weekly - 3 sets - 10 reps - Standing Isometric Shoulder Extension with Doorway - Arm Bent  - 1 x daily - 7 x weekly - 3 sets - 10 reps - Isometric Shoulder Adduction  - 1 x daily - 7 x weekly - 3 sets - 10 reps  ASSESSMENT:  CLINICAL IMPRESSION:   Patient presents with continued report of no pain. Reports her arm may get tired after work shift but reports has been able to perform all job and home duties. She was able to demo good recall of therex today using Theraband. She presents with improved Right ROM and UE strength and has met both goals. She also met her FOTO score goal indicating improved self perceived abilities. At this time she has met all goals and appropriate for discharge from skilled PT care. She was instructed to continue with her UE Strengthening HEP to protect her arm including focus on RC strengthening to stabilize the shoulder. She was instructed to call back or call MD if any issues arise. Patient verbalized understanding and agreement of discharge plan.    OBJECTIVE IMPAIRMENTS decreased ROM, decreased strength, impaired UE functional use, and pain.   ACTIVITY LIMITATIONS carrying, lifting, sleeping, bathing, dressing, and reach over head  PARTICIPATION LIMITATIONS: cleaning, laundry, driving, community activity, and occupation  PERSONAL FACTORS Age, Behavior pattern, Past/current experiences, Profession, Time since onset of injury/illness/exacerbation, and 1-2 comorbidities: arthritis, cancer, and HTN  are also affecting patient's functional outcome.   REHAB POTENTIAL: Good  CLINICAL DECISION MAKING: Stable/uncomplicated  EVALUATION COMPLEXITY: Moderate   GOALS: Goals reviewed with patient? Yes  SHORT TERM GOALS: Target date:  10/25/21  Pt will be  independent with HEP in  order to demonstrate increased ability to perform tasks related to occupation/hobbies. Baseline: Eval (09/13/21):  Pt given HEP at initial evaluation. 11/17/2021= Patient verbalized understanding and demo good recall of theraband exercises.  Goal status: GOAL MET    LONG TERM GOALS: Target date: 12/06/2021    Patient will demonstrate improved function as evidenced by a score of 64 on FOTO measure for full participation in activities at home and in the community. Baseline: Eval (09/13/21): FOTO - 58/64; 11/17/2021= 66 Goal status: GOAL MET  2.  Pt will decrease worst shoulder pain by at least 3 points on the NPRS in order to demonstrate clinically significant reduction in shoulder pain. Baseline: 10/10 at night when resting; 11/17/2021= Patient denies pain Goal status: GOAL MET  3.  Pt will increase strength by at least 1/2 MMT grade in order to demonstrate improvement in strength and function  Baseline: Gross 4-/5 with painful symptoms in the R shoulder: 11/17/2021-Patient presents with 4+/5 right shoulder flex/abd/ext/IR/ER strength with MMT.  Goal status: GOAL MET  4.  Pt will improve ROM to be within 10% of contralateral side in flexion/abduction.  Baseline: R Shoulder Flexion/Abduction - 116/117 deg; Supine Right shoulder flex/abd= 158/162 deg Goal status: GOAL MET   PLAN: PT FREQUENCY: 1-2x/week  PT DURATION: 12 weeks  PLANNED INTERVENTIONS: Therapeutic exercises, Therapeutic activity, Neuromuscular re-education, Balance training, Gait training, Patient/Family education, Self Care, Joint mobilization, and Dry Needling  PLAN FOR NEXT SESSION: Continue with progressing with UE strengthening  Ollen Bowl, PT 11/17/21, 10:10 AM

## 2021-11-24 ENCOUNTER — Other Ambulatory Visit: Payer: Self-pay

## 2021-12-07 ENCOUNTER — Other Ambulatory Visit: Payer: Self-pay

## 2021-12-08 ENCOUNTER — Other Ambulatory Visit: Payer: Self-pay

## 2021-12-08 MED ORDER — PREDNISOLONE ACETATE 1 % OP SUSP
1.0000 [drp] | Freq: Every day | OPHTHALMIC | 3 refills | Status: DC
  Filled 2021-12-08: qty 5, 30d supply, fill #0
  Filled 2021-12-08: qty 5, 90d supply, fill #0
  Filled 2022-01-26: qty 5, 90d supply, fill #1

## 2021-12-13 DIAGNOSIS — Z947 Corneal transplant status: Secondary | ICD-10-CM | POA: Diagnosis not present

## 2021-12-27 ENCOUNTER — Other Ambulatory Visit: Payer: Self-pay

## 2021-12-27 DIAGNOSIS — R0982 Postnasal drip: Secondary | ICD-10-CM | POA: Diagnosis not present

## 2021-12-27 MED ORDER — AZELASTINE HCL 0.1 % NA SOLN
1.0000 | Freq: Two times a day (BID) | NASAL | 11 refills | Status: DC
  Filled 2021-12-27: qty 30, 30d supply, fill #0
  Filled 2022-03-19: qty 30, 30d supply, fill #1

## 2022-01-26 ENCOUNTER — Other Ambulatory Visit: Payer: Self-pay

## 2022-02-02 ENCOUNTER — Other Ambulatory Visit: Payer: Self-pay

## 2022-02-02 DIAGNOSIS — Z947 Corneal transplant status: Secondary | ICD-10-CM | POA: Diagnosis not present

## 2022-02-02 MED ORDER — PREDNISOLONE ACETATE 1 % OP SUSP
1.0000 [drp] | Freq: Every day | OPHTHALMIC | 6 refills | Status: DC
  Filled 2022-02-02 – 2022-02-15 (×3): qty 10, 90d supply, fill #0
  Filled 2022-05-24 – 2022-06-08 (×2): qty 10, 90d supply, fill #1
  Filled 2022-09-21: qty 10, 90d supply, fill #2
  Filled 2023-01-01: qty 10, 90d supply, fill #3

## 2022-02-12 ENCOUNTER — Other Ambulatory Visit: Payer: Self-pay

## 2022-02-15 ENCOUNTER — Other Ambulatory Visit: Payer: Self-pay

## 2022-02-23 ENCOUNTER — Other Ambulatory Visit: Payer: Self-pay

## 2022-02-23 MED ORDER — METOPROLOL TARTRATE 25 MG PO TABS
25.0000 mg | ORAL_TABLET | Freq: Every day | ORAL | 1 refills | Status: DC
  Filled 2022-02-23: qty 90, 90d supply, fill #0
  Filled 2022-05-29: qty 90, 90d supply, fill #1

## 2022-03-01 ENCOUNTER — Other Ambulatory Visit: Payer: Self-pay

## 2022-03-01 DIAGNOSIS — C50911 Malignant neoplasm of unspecified site of right female breast: Secondary | ICD-10-CM | POA: Diagnosis not present

## 2022-03-01 DIAGNOSIS — Z1231 Encounter for screening mammogram for malignant neoplasm of breast: Secondary | ICD-10-CM | POA: Diagnosis not present

## 2022-03-01 DIAGNOSIS — Z170 Estrogen receptor positive status [ER+]: Secondary | ICD-10-CM | POA: Diagnosis not present

## 2022-03-02 ENCOUNTER — Other Ambulatory Visit: Payer: Self-pay

## 2022-03-06 ENCOUNTER — Other Ambulatory Visit: Payer: Self-pay

## 2022-03-14 ENCOUNTER — Other Ambulatory Visit: Payer: Self-pay

## 2022-03-16 ENCOUNTER — Other Ambulatory Visit: Payer: Self-pay

## 2022-03-19 ENCOUNTER — Other Ambulatory Visit: Payer: Self-pay

## 2022-04-13 ENCOUNTER — Other Ambulatory Visit: Payer: Self-pay

## 2022-04-17 DIAGNOSIS — E785 Hyperlipidemia, unspecified: Secondary | ICD-10-CM | POA: Diagnosis not present

## 2022-04-17 DIAGNOSIS — I10 Essential (primary) hypertension: Secondary | ICD-10-CM | POA: Diagnosis not present

## 2022-04-24 DIAGNOSIS — Z853 Personal history of malignant neoplasm of breast: Secondary | ICD-10-CM | POA: Diagnosis not present

## 2022-04-24 DIAGNOSIS — Z85528 Personal history of other malignant neoplasm of kidney: Secondary | ICD-10-CM | POA: Diagnosis not present

## 2022-04-24 DIAGNOSIS — Z8673 Personal history of transient ischemic attack (TIA), and cerebral infarction without residual deficits: Secondary | ICD-10-CM | POA: Diagnosis not present

## 2022-04-24 DIAGNOSIS — E785 Hyperlipidemia, unspecified: Secondary | ICD-10-CM | POA: Diagnosis not present

## 2022-04-24 DIAGNOSIS — I10 Essential (primary) hypertension: Secondary | ICD-10-CM | POA: Diagnosis not present

## 2022-04-24 DIAGNOSIS — Z2821 Immunization not carried out because of patient refusal: Secondary | ICD-10-CM | POA: Diagnosis not present

## 2022-04-27 ENCOUNTER — Other Ambulatory Visit: Payer: Self-pay

## 2022-04-30 ENCOUNTER — Other Ambulatory Visit: Payer: Self-pay

## 2022-04-30 DIAGNOSIS — B360 Pityriasis versicolor: Secondary | ICD-10-CM | POA: Diagnosis not present

## 2022-04-30 DIAGNOSIS — L301 Dyshidrosis [pompholyx]: Secondary | ICD-10-CM | POA: Diagnosis not present

## 2022-04-30 MED ORDER — KETOCONAZOLE 2 % EX SHAM
1.0000 | MEDICATED_SHAMPOO | Freq: Every day | CUTANEOUS | 11 refills | Status: AC
  Filled 2022-04-30: qty 120, 24d supply, fill #0

## 2022-04-30 MED ORDER — CLOBETASOL PROPIONATE 0.05 % EX OINT
1.0000 | TOPICAL_OINTMENT | Freq: Two times a day (BID) | CUTANEOUS | 2 refills | Status: DC
  Filled 2022-04-30: qty 60, 30d supply, fill #0

## 2022-05-16 DIAGNOSIS — Z634 Disappearance and death of family member: Secondary | ICD-10-CM | POA: Diagnosis not present

## 2022-05-16 DIAGNOSIS — F4321 Adjustment disorder with depressed mood: Secondary | ICD-10-CM | POA: Diagnosis not present

## 2022-05-16 DIAGNOSIS — I10 Essential (primary) hypertension: Secondary | ICD-10-CM | POA: Diagnosis not present

## 2022-05-16 DIAGNOSIS — R8290 Unspecified abnormal findings in urine: Secondary | ICD-10-CM | POA: Diagnosis not present

## 2022-05-24 ENCOUNTER — Other Ambulatory Visit: Payer: Self-pay

## 2022-05-24 DIAGNOSIS — J019 Acute sinusitis, unspecified: Secondary | ICD-10-CM | POA: Diagnosis not present

## 2022-05-24 DIAGNOSIS — J40 Bronchitis, not specified as acute or chronic: Secondary | ICD-10-CM | POA: Diagnosis not present

## 2022-05-24 DIAGNOSIS — I10 Essential (primary) hypertension: Secondary | ICD-10-CM | POA: Diagnosis not present

## 2022-05-24 DIAGNOSIS — Z03818 Encounter for observation for suspected exposure to other biological agents ruled out: Secondary | ICD-10-CM | POA: Diagnosis not present

## 2022-05-24 MED ORDER — AZITHROMYCIN 250 MG PO TABS
ORAL_TABLET | ORAL | 0 refills | Status: DC
  Filled 2022-05-24: qty 6, 5d supply, fill #0

## 2022-05-24 MED ORDER — PROMETHAZINE-DM 6.25-15 MG/5ML PO SYRP
ORAL_SOLUTION | ORAL | 0 refills | Status: DC
  Filled 2022-05-24: qty 120, 6d supply, fill #0

## 2022-05-24 MED ORDER — PREDNISONE 10 MG PO TABS
ORAL_TABLET | ORAL | 0 refills | Status: DC
  Filled 2022-05-24: qty 5, 5d supply, fill #0

## 2022-05-25 ENCOUNTER — Other Ambulatory Visit: Payer: Self-pay

## 2022-05-25 MED ORDER — LATANOPROST 0.005 % OP SOLN
1.0000 [drp] | Freq: Every evening | OPHTHALMIC | 5 refills | Status: DC
  Filled 2022-05-25: qty 2.5, 25d supply, fill #0
  Filled 2022-07-11: qty 2.5, 25d supply, fill #1
  Filled 2022-08-10: qty 2.5, 25d supply, fill #2
  Filled 2022-09-12: qty 2.5, 25d supply, fill #3
  Filled 2022-10-18: qty 2.5, 25d supply, fill #4
  Filled 2022-12-04: qty 2.5, 25d supply, fill #5

## 2022-05-29 ENCOUNTER — Other Ambulatory Visit: Payer: Self-pay

## 2022-05-29 DIAGNOSIS — T50905A Adverse effect of unspecified drugs, medicaments and biological substances, initial encounter: Secondary | ICD-10-CM | POA: Diagnosis not present

## 2022-05-29 DIAGNOSIS — J069 Acute upper respiratory infection, unspecified: Secondary | ICD-10-CM | POA: Diagnosis not present

## 2022-05-29 DIAGNOSIS — I10 Essential (primary) hypertension: Secondary | ICD-10-CM | POA: Diagnosis not present

## 2022-05-30 ENCOUNTER — Ambulatory Visit: Payer: Commercial Managed Care - PPO | Admitting: Dietician

## 2022-06-08 ENCOUNTER — Other Ambulatory Visit: Payer: Self-pay

## 2022-06-12 DIAGNOSIS — H401131 Primary open-angle glaucoma, bilateral, mild stage: Secondary | ICD-10-CM | POA: Diagnosis not present

## 2022-06-19 DIAGNOSIS — Z947 Corneal transplant status: Secondary | ICD-10-CM | POA: Diagnosis not present

## 2022-06-19 DIAGNOSIS — H401131 Primary open-angle glaucoma, bilateral, mild stage: Secondary | ICD-10-CM | POA: Diagnosis not present

## 2022-06-28 ENCOUNTER — Other Ambulatory Visit: Payer: Self-pay

## 2022-06-28 DIAGNOSIS — R0982 Postnasal drip: Secondary | ICD-10-CM | POA: Diagnosis not present

## 2022-06-28 DIAGNOSIS — R053 Chronic cough: Secondary | ICD-10-CM | POA: Diagnosis not present

## 2022-06-28 MED ORDER — AZELASTINE HCL 0.1 % NA SOLN
1.0000 | Freq: Two times a day (BID) | NASAL | 11 refills | Status: DC
  Filled 2022-06-28: qty 30, 50d supply, fill #0

## 2022-06-28 MED ORDER — BENZONATATE 200 MG PO CAPS
ORAL_CAPSULE | ORAL | 0 refills | Status: DC
  Filled 2022-06-28: qty 20, 7d supply, fill #0

## 2022-07-04 ENCOUNTER — Other Ambulatory Visit: Payer: Self-pay

## 2022-07-11 ENCOUNTER — Other Ambulatory Visit: Payer: Self-pay

## 2022-07-27 DIAGNOSIS — M25521 Pain in right elbow: Secondary | ICD-10-CM | POA: Diagnosis not present

## 2022-07-27 DIAGNOSIS — I10 Essential (primary) hypertension: Secondary | ICD-10-CM | POA: Diagnosis not present

## 2022-07-27 DIAGNOSIS — M79601 Pain in right arm: Secondary | ICD-10-CM | POA: Diagnosis not present

## 2022-07-27 DIAGNOSIS — R202 Paresthesia of skin: Secondary | ICD-10-CM | POA: Diagnosis not present

## 2022-07-27 DIAGNOSIS — R200 Anesthesia of skin: Secondary | ICD-10-CM | POA: Diagnosis not present

## 2022-08-06 DIAGNOSIS — Z961 Presence of intraocular lens: Secondary | ICD-10-CM | POA: Diagnosis not present

## 2022-08-06 DIAGNOSIS — Z947 Corneal transplant status: Secondary | ICD-10-CM | POA: Diagnosis not present

## 2022-08-10 ENCOUNTER — Other Ambulatory Visit: Payer: Self-pay

## 2022-08-20 ENCOUNTER — Other Ambulatory Visit: Payer: Self-pay

## 2022-08-20 MED ORDER — METOPROLOL TARTRATE 25 MG PO TABS
25.0000 mg | ORAL_TABLET | Freq: Every day | ORAL | 1 refills | Status: DC
  Filled 2022-08-20: qty 90, 90d supply, fill #0
  Filled 2022-11-06: qty 90, 90d supply, fill #1

## 2022-08-21 ENCOUNTER — Other Ambulatory Visit: Payer: Self-pay

## 2022-08-21 DIAGNOSIS — Z947 Corneal transplant status: Secondary | ICD-10-CM | POA: Diagnosis not present

## 2022-09-12 ENCOUNTER — Other Ambulatory Visit: Payer: Self-pay

## 2022-09-13 DIAGNOSIS — R350 Frequency of micturition: Secondary | ICD-10-CM | POA: Diagnosis not present

## 2022-09-13 DIAGNOSIS — I10 Essential (primary) hypertension: Secondary | ICD-10-CM | POA: Diagnosis not present

## 2022-09-13 DIAGNOSIS — J069 Acute upper respiratory infection, unspecified: Secondary | ICD-10-CM | POA: Diagnosis not present

## 2022-09-13 DIAGNOSIS — R3589 Other polyuria: Secondary | ICD-10-CM | POA: Diagnosis not present

## 2022-09-13 DIAGNOSIS — Z03818 Encounter for observation for suspected exposure to other biological agents ruled out: Secondary | ICD-10-CM | POA: Diagnosis not present

## 2022-09-21 ENCOUNTER — Other Ambulatory Visit: Payer: Self-pay

## 2022-10-10 ENCOUNTER — Other Ambulatory Visit: Payer: Self-pay

## 2022-10-10 DIAGNOSIS — I10 Essential (primary) hypertension: Secondary | ICD-10-CM | POA: Diagnosis not present

## 2022-10-10 DIAGNOSIS — E785 Hyperlipidemia, unspecified: Secondary | ICD-10-CM | POA: Diagnosis not present

## 2022-10-10 DIAGNOSIS — Z85528 Personal history of other malignant neoplasm of kidney: Secondary | ICD-10-CM | POA: Diagnosis not present

## 2022-10-10 DIAGNOSIS — R52 Pain, unspecified: Secondary | ICD-10-CM | POA: Diagnosis not present

## 2022-10-10 DIAGNOSIS — M79602 Pain in left arm: Secondary | ICD-10-CM | POA: Diagnosis not present

## 2022-10-10 MED ORDER — LOVASTATIN 40 MG PO TABS
80.0000 mg | ORAL_TABLET | Freq: Every day | ORAL | 3 refills | Status: DC
  Filled 2022-10-10: qty 180, 90d supply, fill #0
  Filled 2023-01-23: qty 180, 90d supply, fill #1
  Filled 2023-04-29: qty 180, 90d supply, fill #2
  Filled 2023-07-24 – 2023-07-31 (×5): qty 180, 90d supply, fill #3

## 2022-10-18 ENCOUNTER — Other Ambulatory Visit: Payer: Self-pay

## 2022-10-23 DIAGNOSIS — E785 Hyperlipidemia, unspecified: Secondary | ICD-10-CM | POA: Diagnosis not present

## 2022-10-23 DIAGNOSIS — Z136 Encounter for screening for cardiovascular disorders: Secondary | ICD-10-CM | POA: Diagnosis not present

## 2022-10-23 DIAGNOSIS — I10 Essential (primary) hypertension: Secondary | ICD-10-CM | POA: Diagnosis not present

## 2022-10-30 DIAGNOSIS — I10 Essential (primary) hypertension: Secondary | ICD-10-CM | POA: Diagnosis not present

## 2022-10-30 DIAGNOSIS — Z853 Personal history of malignant neoplasm of breast: Secondary | ICD-10-CM | POA: Diagnosis not present

## 2022-10-30 DIAGNOSIS — Z0000 Encounter for general adult medical examination without abnormal findings: Secondary | ICD-10-CM | POA: Diagnosis not present

## 2022-10-30 DIAGNOSIS — Z8673 Personal history of transient ischemic attack (TIA), and cerebral infarction without residual deficits: Secondary | ICD-10-CM | POA: Diagnosis not present

## 2022-10-30 DIAGNOSIS — Z85528 Personal history of other malignant neoplasm of kidney: Secondary | ICD-10-CM | POA: Diagnosis not present

## 2022-10-30 DIAGNOSIS — E785 Hyperlipidemia, unspecified: Secondary | ICD-10-CM | POA: Diagnosis not present

## 2022-11-06 ENCOUNTER — Other Ambulatory Visit: Payer: Self-pay

## 2022-12-04 ENCOUNTER — Other Ambulatory Visit: Payer: Self-pay

## 2022-12-11 DIAGNOSIS — Z947 Corneal transplant status: Secondary | ICD-10-CM | POA: Diagnosis not present

## 2023-01-01 ENCOUNTER — Other Ambulatory Visit: Payer: Self-pay

## 2023-01-03 ENCOUNTER — Other Ambulatory Visit: Payer: Self-pay

## 2023-01-03 MED ORDER — LATANOPROST 0.005 % OP SOLN
1.0000 [drp] | Freq: Every evening | OPHTHALMIC | 5 refills | Status: DC
  Filled 2023-01-03: qty 2.5, 25d supply, fill #0
  Filled 2023-03-06: qty 2.5, 25d supply, fill #1
  Filled 2023-04-12: qty 2.5, 25d supply, fill #2
  Filled 2023-05-31: qty 2.5, 25d supply, fill #3
  Filled 2023-07-11: qty 2.5, 25d supply, fill #4
  Filled 2023-07-24 – 2023-07-31 (×2): qty 2.5, 25d supply, fill #5

## 2023-01-23 ENCOUNTER — Other Ambulatory Visit: Payer: Self-pay

## 2023-01-25 ENCOUNTER — Ambulatory Visit
Admission: RE | Admit: 2023-01-25 | Discharge: 2023-01-25 | Disposition: A | Discharge status: Home / Self Care | Payer: No Typology Code available for payment source | Source: Ambulatory Visit | Attending: Physician Assistant | Admitting: Physician Assistant

## 2023-01-25 ENCOUNTER — Other Ambulatory Visit: Payer: Self-pay | Admitting: Physician Assistant

## 2023-01-25 DIAGNOSIS — M25552 Pain in left hip: Secondary | ICD-10-CM

## 2023-01-25 DIAGNOSIS — M25512 Pain in left shoulder: Secondary | ICD-10-CM | POA: Diagnosis present

## 2023-01-28 ENCOUNTER — Other Ambulatory Visit: Payer: Self-pay

## 2023-01-29 ENCOUNTER — Other Ambulatory Visit: Payer: Self-pay

## 2023-01-30 ENCOUNTER — Other Ambulatory Visit: Payer: Self-pay

## 2023-01-30 MED ORDER — METOPROLOL TARTRATE 25 MG PO TABS
25.0000 mg | ORAL_TABLET | Freq: Every day | ORAL | 1 refills | Status: DC
  Filled 2023-01-30: qty 90, 90d supply, fill #0
  Filled 2023-04-25 – 2023-04-29 (×3): qty 90, 90d supply, fill #1

## 2023-02-22 DIAGNOSIS — R053 Chronic cough: Secondary | ICD-10-CM | POA: Diagnosis not present

## 2023-03-06 ENCOUNTER — Other Ambulatory Visit: Payer: Self-pay

## 2023-03-07 ENCOUNTER — Other Ambulatory Visit: Payer: Self-pay

## 2023-03-20 ENCOUNTER — Other Ambulatory Visit: Payer: Self-pay

## 2023-03-26 ENCOUNTER — Other Ambulatory Visit: Payer: Self-pay

## 2023-03-26 DIAGNOSIS — M79642 Pain in left hand: Secondary | ICD-10-CM | POA: Diagnosis not present

## 2023-03-26 DIAGNOSIS — R053 Chronic cough: Secondary | ICD-10-CM | POA: Diagnosis not present

## 2023-03-26 DIAGNOSIS — R8290 Unspecified abnormal findings in urine: Secondary | ICD-10-CM | POA: Diagnosis not present

## 2023-03-26 DIAGNOSIS — Z03818 Encounter for observation for suspected exposure to other biological agents ruled out: Secondary | ICD-10-CM | POA: Diagnosis not present

## 2023-03-26 DIAGNOSIS — I10 Essential (primary) hypertension: Secondary | ICD-10-CM | POA: Diagnosis not present

## 2023-03-26 DIAGNOSIS — M79641 Pain in right hand: Secondary | ICD-10-CM | POA: Diagnosis not present

## 2023-03-26 MED ORDER — BENZONATATE 100 MG PO CAPS
100.0000 mg | ORAL_CAPSULE | Freq: Three times a day (TID) | ORAL | 0 refills | Status: DC | PRN
  Filled 2023-03-26: qty 20, 7d supply, fill #0

## 2023-04-01 ENCOUNTER — Other Ambulatory Visit: Payer: Self-pay

## 2023-04-01 MED ORDER — AMOXICILLIN 875 MG PO TABS
ORAL_TABLET | ORAL | 0 refills | Status: DC
  Filled 2023-04-01: qty 14, 7d supply, fill #0

## 2023-04-08 ENCOUNTER — Other Ambulatory Visit: Payer: Self-pay

## 2023-04-08 ENCOUNTER — Emergency Department
Admission: EM | Admit: 2023-04-08 | Discharge: 2023-04-08 | Disposition: A | Discharge status: Home / Self Care | Attending: Emergency Medicine | Admitting: Emergency Medicine

## 2023-04-08 DIAGNOSIS — S0990XA Unspecified injury of head, initial encounter: Secondary | ICD-10-CM

## 2023-04-08 DIAGNOSIS — W228XXA Striking against or struck by other objects, initial encounter: Secondary | ICD-10-CM | POA: Diagnosis not present

## 2023-04-08 DIAGNOSIS — S0181XA Laceration without foreign body of other part of head, initial encounter: Secondary | ICD-10-CM | POA: Diagnosis not present

## 2023-04-08 MED ORDER — LIDOCAINE-EPINEPHRINE-TETRACAINE (LET) TOPICAL GEL
3.0000 mL | Freq: Once | TOPICAL | Status: AC
  Administered 2023-04-08: 3 mL via TOPICAL
  Filled 2023-04-08: qty 3

## 2023-04-08 NOTE — Discharge Instructions (Signed)
 Your exam is normal and reassuring at this time.  No signs of a serious closed head injury secondary to your contusion.  Your laceration has been cleansed and closed using Dermabond.  Avoid any lotions, creams, oils, ointments over the wound glue.  Follow-up with your primary provider as needed.

## 2023-04-08 NOTE — ED Provider Notes (Signed)
 [facility] Emergency Department Provider Note     Event Date/Time   First MD Initiated Contact with Patient 04/08/23 2132     (approximate)   History   Head Injury   HPI  Makayla Rice is a 73 y.o. female presents to the ED for evaluation of a superficial laceration to her forehead.  Patient was getting out of her car to pump gas, [place] incidentally hit her forehead on the door jam.  She reports some activated from the forehead that currently has ceased.  She denies any LOC, nausea, vomiting, or dizziness.  She denies any serious head injury at this time is denying any imaging from triage.  No other complaints reported at this time.   Physical Exam   Triage Vital Signs: ED Triage Vitals  Encounter Vitals Group     BP 04/08/23 2022 (!) 187/76     Systolic BP Percentile --      Diastolic BP Percentile --      Pulse Rate 04/08/23 2022 77     Resp 04/08/23 2022 18     Temp 04/08/23 2022 97.9 F (36.6 C)     Temp Source 04/08/23 2022 Oral     SpO2 04/08/23 2022 95 %     Weight 04/08/23 2022 158 lb (71.7 kg)     Height 04/08/23 2022 5\' 4"  (1.626 m)     Head Circumference --      Peak Flow --      Pain Score 04/08/23 2037 6     Pain Loc --      Pain Education --      Exclude from Growth Chart --     Most recent vital signs: Vitals:   04/08/23 2022  BP: (!) 187/76  Pulse: 77  Resp: 18  Temp: 97.9 F (36.6 C)  SpO2: 95%    General Awake, no distress. NAD HEENT NCAT, except for a superficial laceration across the central forehead measuring approximately 1.5 cm.  No active bleeding at this time.  PERRL. EOMI. No rhinorrhea. Mucous membranes are moist.  CV:  Good peripheral perfusion. RRR RESP:  Normal effort. CTA ABD:  No distention.  NEURO: Cranial nerves II to XII grossly intact   ED Results / Procedures / Treatments   Labs (all labs ordered are listed, but only abnormal results are displayed) Labs Reviewed - No data to  display   EKG   RADIOLOGY  No results found.   PROCEDURES:  Critical Care performed: No  .Laceration Repair  Date/Time: 04/08/2023 9:38 PM  Performed by: Lissa Hoard, PA-C Authorized by: Lissa Hoard, PA-C   Consent:    Consent obtained:  Verbal   Consent given by:  Patient   Risks, benefits, and alternatives were discussed: yes     Risks discussed:  Pain and poor wound healing   Alternatives discussed:  No treatment Universal protocol:    Site/side marked: yes     Patient identity confirmed:  Verbally with patient Anesthesia:    Anesthesia method:  Topical application   Topical anesthetic:  LET Laceration details:    Location:  Face   Face location:  Forehead   Length (cm):  1.5   Depth (mm):  3 Exploration:    Limited defect created (wound extended): no     Hemostasis achieved with:  LET   Contaminated: no   Treatment:    Area cleansed with:  Saline   Amount of cleaning:  Standard   Irrigation solution:  Sterile saline   Irrigation volume:  10   Irrigation method:  Syringe   Debridement:  None   Undermining:  None   Scar revision: no   Skin repair:    Repair method:  Tissue adhesive Approximation:    Approximation:  Close Repair type:    Repair type:  Simple Post-procedure details:    Dressing:  Open (no dressing)   Procedure completion:  Tolerated well, no immediate complications    MEDICATIONS ORDERED IN ED: Medications  lidocaine-EPINEPHrine-tetracaine (LET) topical gel (has no administration in time range)     IMPRESSION / MDM / ASSESSMENT AND PLAN / ED COURSE  I reviewed the triage vital signs and the nursing notes.                              Differential diagnosis includes, but is not limited to, head contusion, skull facial abrasion, laceration, hematoma  Patient's presentation is most consistent with acute, uncomplicated illness.  Patient's diagnosis is consistent with superficial forehead abrasion and  laceration.  No reports of any LOC or evidence of closed head injury.  Patient is overall reassured with her benign exam.  She has declined any CT imaging, and I agree that she does not appear to be in need of any emergent imaging.  She will have her wound cleansed, dressed, and closed using a Dermabond closure.  Patient will be discharged home with instructions to take OTC Tylenol as needed. Patient is to follow up with her PCP as needed or otherwise directed. Patient is given ED precautions to return to the ED for any worsening or new symptoms.   FINAL CLINICAL IMPRESSION(S) / ED DIAGNOSES   Final diagnoses:  Minor head injury, initial encounter  Forehead laceration, initial encounter     Rx / DC Orders   ED Discharge Orders     None        Note:  This document was prepared using Dragon voice recognition software and may include unintentional dictation errors.    Lissa Hoard, PA-C 04/08/23 2140    Merwyn Katos, MD 04/08/23 [phone]

## 2023-04-08 NOTE — ED Triage Notes (Signed)
 Pt POV from home d/t hitting head on car door when getting gas.  Pt had a littl dried blood on forehead - sup lac.   Pt is an [employer] employee who just left here from work.  Pt states she does not want a Head CT but wants to know if needs sutures and not get an infection.

## 2023-04-12 ENCOUNTER — Other Ambulatory Visit: Payer: Self-pay

## 2023-04-23 ENCOUNTER — Other Ambulatory Visit: Payer: Self-pay

## 2023-04-23 DIAGNOSIS — E785 Hyperlipidemia, unspecified: Secondary | ICD-10-CM | POA: Diagnosis not present

## 2023-04-23 DIAGNOSIS — I10 Essential (primary) hypertension: Secondary | ICD-10-CM | POA: Diagnosis not present

## 2023-04-24 ENCOUNTER — Other Ambulatory Visit: Payer: Self-pay

## 2023-04-25 ENCOUNTER — Other Ambulatory Visit: Payer: Self-pay

## 2023-04-26 ENCOUNTER — Other Ambulatory Visit: Payer: Self-pay

## 2023-04-29 ENCOUNTER — Other Ambulatory Visit: Payer: Self-pay

## 2023-04-29 ENCOUNTER — Other Ambulatory Visit: Payer: Self-pay | Admitting: *Deleted

## 2023-04-29 ENCOUNTER — Inpatient Hospital Stay
Admission: RE | Admit: 2023-04-29 | Discharge: 2023-04-29 | Disposition: A | Discharge status: Home / Self Care | Payer: Self-pay | Source: Ambulatory Visit | Attending: Internal Medicine | Admitting: Internal Medicine

## 2023-04-29 ENCOUNTER — Other Ambulatory Visit: Payer: Self-pay | Admitting: Internal Medicine

## 2023-04-29 DIAGNOSIS — Z1231 Encounter for screening mammogram for malignant neoplasm of breast: Secondary | ICD-10-CM

## 2023-04-29 MED ORDER — PREDNISOLONE ACETATE 1 % OP SUSP
1.0000 [drp] | Freq: Every day | OPHTHALMIC | 3 refills | Status: DC
Start: 2023-04-29 — End: 2023-11-14
  Filled 2023-04-29: qty 5, 90d supply, fill #0
  Filled 2023-07-24 – 2023-07-31 (×5): qty 5, 90d supply, fill #1

## 2023-04-30 ENCOUNTER — Other Ambulatory Visit: Payer: Self-pay

## 2023-04-30 ENCOUNTER — Other Ambulatory Visit: Payer: Self-pay | Admitting: Internal Medicine

## 2023-04-30 DIAGNOSIS — R739 Hyperglycemia, unspecified: Secondary | ICD-10-CM | POA: Diagnosis not present

## 2023-04-30 DIAGNOSIS — E785 Hyperlipidemia, unspecified: Secondary | ICD-10-CM | POA: Diagnosis not present

## 2023-04-30 DIAGNOSIS — Z853 Personal history of malignant neoplasm of breast: Secondary | ICD-10-CM | POA: Diagnosis not present

## 2023-04-30 DIAGNOSIS — Z131 Encounter for screening for diabetes mellitus: Secondary | ICD-10-CM | POA: Diagnosis not present

## 2023-04-30 DIAGNOSIS — M542 Cervicalgia: Secondary | ICD-10-CM | POA: Diagnosis not present

## 2023-04-30 DIAGNOSIS — I10 Essential (primary) hypertension: Secondary | ICD-10-CM | POA: Diagnosis not present

## 2023-04-30 DIAGNOSIS — Z8673 Personal history of transient ischemic attack (TIA), and cerebral infarction without residual deficits: Secondary | ICD-10-CM | POA: Diagnosis not present

## 2023-04-30 DIAGNOSIS — Z85528 Personal history of other malignant neoplasm of kidney: Secondary | ICD-10-CM | POA: Diagnosis not present

## 2023-04-30 DIAGNOSIS — Z1231 Encounter for screening mammogram for malignant neoplasm of breast: Secondary | ICD-10-CM

## 2023-05-07 ENCOUNTER — Ambulatory Visit
Admission: RE | Admit: 2023-05-07 | Discharge: 2023-05-07 | Disposition: A | Discharge status: Home / Self Care | Source: Ambulatory Visit | Attending: Internal Medicine | Admitting: Internal Medicine

## 2023-05-07 DIAGNOSIS — Z1231 Encounter for screening mammogram for malignant neoplasm of breast: Secondary | ICD-10-CM | POA: Diagnosis not present

## 2023-05-20 DIAGNOSIS — M542 Cervicalgia: Secondary | ICD-10-CM | POA: Diagnosis not present

## 2023-05-31 ENCOUNTER — Other Ambulatory Visit: Payer: Self-pay

## 2023-06-11 DIAGNOSIS — Z947 Corneal transplant status: Secondary | ICD-10-CM | POA: Diagnosis not present

## 2023-06-11 DIAGNOSIS — H401111 Primary open-angle glaucoma, right eye, mild stage: Secondary | ICD-10-CM | POA: Diagnosis not present

## 2023-06-11 DIAGNOSIS — H401131 Primary open-angle glaucoma, bilateral, mild stage: Secondary | ICD-10-CM | POA: Diagnosis not present

## 2023-06-12 ENCOUNTER — Encounter: Payer: Self-pay | Admitting: Dietician

## 2023-06-12 ENCOUNTER — Encounter: Attending: Internal Medicine | Admitting: Dietician

## 2023-06-12 DIAGNOSIS — E639 Nutritional deficiency, unspecified: Secondary | ICD-10-CM

## 2023-06-12 NOTE — Patient Instructions (Addendum)
 Work lowering your consumption of sugary beverages (sodas, juices). Stick to a 4 oz serving of orange juice, try diet or ZERO versions of sodas or choose mini-cans instead of 20 oz bottles of tour soda.   Choose smaller portions of dessert foods (donuts, cakes, pies).  Begin walking on the track on Tuesday morning for 20-30 minutes.  If getting canned vegetables, choose "No Salt Added" versions. If having frozen vegetables, try to avoid ones that say "Seasoned" or "Sauced".  Try to choose lean meats (sirloin, [place] strip), ground beef (93%/7%), choose provolone cheese instead of American cheese.

## 2023-06-12 NOTE — Progress Notes (Signed)
 Medical Nutrition Therapy  Appointment Start time:  1510  Appointment End time:  1600 Employee Wellness Visit 1 of 3 EID: [id number]  Primary concerns today: Hyperglycemia, Hyperlipidemia  Referral diagnosis: R73.9 - Hyperglycemia Preferred learning style: No preference indicated Learning readiness: Contemplating   NUTRITION ASSESSMENT    Clinical Medical Hx: Hyperglycemia, HLD, HTN, Breast Cancer, Renal Cell carcinoma, CVA Medications: Lovastatin , Metoprolol  Labs: Glucose - 130 mg/dL, Cholesterol - 604 mg/dL, LDL - 86 mg/dL Notable Signs/Symptoms: N/A   Lifestyle & Dietary Hx Pt reports son passed about a year ago, states they deal with intermittent stress/grief, can interfere with sleep at times. Pt reports drinking sodas too often (2-3 20 oz daily), not drinking enough water (<24 oz a day), eating too many desserts (donuts, cake, ice cream). Pt reports rarely cooking meals for themselves at home, usually eats away from home. Pt reports being on their feet at work, very active during the day but doesn't do any structured exercise.   Estimated daily fluid intake: 64 oz Supplements: MVI, Calcium Sleep: Poorly, goes to bed late Stress / self-care: 6/10 Current average weekly physical activity: ADLs, Active at work   24-Hr Dietary Recall First Meal: 1 scrambled egg, 3 strips of bacon, hashbrowns, OJ Snack:  Second Meal: [place] bologna sandwich with american cheese, mayo on white bread Snack:  Third Meal:  Snack:  Beverages: OJ, Water    NUTRITION DIAGNOSIS  NB-1.1 Food and nutrition-related knowledge deficit As related to Hyperglycemia/HLD.  As evidenced by FBG of 130 mg/dL, high dose statin therapy, dietary recall high in SSBs, saturated fats..   NUTRITION INTERVENTION  Nutrition education (E-1) on the following topics:  Educated patient on the pathophysiology of diabetes. This includes why our bodies need circulating blood sugar, the relationship between insulin and  blood sugar, and the results of insulin resistance and/or pancreatic insufficiency on the development of diabetes. Educated patient on factors that contribute to elevation of blood sugars, such as stress, illness, injury,and food choices. Discussed the role that physical activity plays in lowering blood sugar. Educate patient on the three main macronutrients. Protein, fats, and carbohydrates. Discussed how each of these macronutrients affect blood sugar levels, especially carbohydrate, and the importance of eating a consistent amount of carbohydrate throughout the day.  Educate pt on the difference between LDL and HDL cholesterol.  Educate pt on the factors that can increase and decrease HDL cholesterol, including exercise to increase HDL, and tobacco use to decrease HDL.  Educate pt on factors that can elevate LDL cholesterol, including high dietary intake of saturated fats. Educate pt on identifying sources of saturated fats, and how to make alternative food choices to lower saturated fat intake. Educate pt on the role of soluble fiber in binding to cholesterol in the GI tract an eliminating it from the body. Educate pt on dietary sources of soluble fiber.  Educate pt on the potential dietary causes of hypertriglyceridemia, including concentrated sugars and sugar sweetened beverages. Educate on the role of elevated LDL,total cholesterol, and triglycerides on cardiovascular health. Educate pt on the role of physical activity in lowering LDL and increasing HDL cholesterol.   Handouts Provided Include  AVS  Learning Style & Readiness for Change Teaching method utilized: Visual & Auditory  Demonstrated degree of understanding via: Teach Back  Barriers to learning/adherence to lifestyle change: N/A  Goals Established by Pt Work lowering your consumption of sugary beverages (sodas, juices). Stick to a 4 oz serving of orange juice, try diet or ZERO versions of  sodas or choose mini-cans instead of 20 oz  bottles of tour soda.  Choose smaller portions of dessert foods (donuts, cakes, pies). Begin walking on the track on Tuesday morning for 20-30 minutes. If getting canned vegetables, choose "No Salt Added" versions. If having frozen vegetables, try to avoid ones that say "Seasoned" or "Sauced". Try to choose lean meats (sirloin, [place] strip), ground beef (93%/7%), choose provolone cheese instead of American cheese.   MONITORING & EVALUATION Dietary intake, weekly physical activity, and labs in 2 months.  Next Steps  Patient is to follow up for wellness visit 2 of 3.

## 2023-07-11 ENCOUNTER — Other Ambulatory Visit: Payer: Self-pay

## 2023-07-18 DIAGNOSIS — J069 Acute upper respiratory infection, unspecified: Secondary | ICD-10-CM | POA: Diagnosis not present

## 2023-07-18 DIAGNOSIS — E785 Hyperlipidemia, unspecified: Secondary | ICD-10-CM | POA: Diagnosis not present

## 2023-07-18 DIAGNOSIS — Z853 Personal history of malignant neoplasm of breast: Secondary | ICD-10-CM | POA: Diagnosis not present

## 2023-07-18 DIAGNOSIS — I10 Essential (primary) hypertension: Secondary | ICD-10-CM | POA: Diagnosis not present

## 2023-07-18 DIAGNOSIS — Z85528 Personal history of other malignant neoplasm of kidney: Secondary | ICD-10-CM | POA: Diagnosis not present

## 2023-07-18 DIAGNOSIS — Z8673 Personal history of transient ischemic attack (TIA), and cerebral infarction without residual deficits: Secondary | ICD-10-CM | POA: Diagnosis not present

## 2023-07-24 ENCOUNTER — Other Ambulatory Visit: Payer: Self-pay

## 2023-07-25 ENCOUNTER — Other Ambulatory Visit: Payer: Self-pay

## 2023-07-25 ENCOUNTER — Other Ambulatory Visit (HOSPITAL_COMMUNITY): Payer: Self-pay

## 2023-07-25 DIAGNOSIS — N898 Other specified noninflammatory disorders of vagina: Secondary | ICD-10-CM | POA: Diagnosis not present

## 2023-07-25 DIAGNOSIS — Z1272 Encounter for screening for malignant neoplasm of vagina: Secondary | ICD-10-CM | POA: Diagnosis not present

## 2023-07-25 DIAGNOSIS — R87612 Low grade squamous intraepithelial lesion on cytologic smear of cervix (LGSIL): Secondary | ICD-10-CM | POA: Diagnosis not present

## 2023-07-25 DIAGNOSIS — Z1331 Encounter for screening for depression: Secondary | ICD-10-CM | POA: Diagnosis not present

## 2023-07-25 DIAGNOSIS — Z8742 Personal history of other diseases of the female genital tract: Secondary | ICD-10-CM | POA: Diagnosis not present

## 2023-07-25 MED ORDER — METOPROLOL TARTRATE 25 MG PO TABS
25.0000 mg | ORAL_TABLET | Freq: Every day | ORAL | 1 refills | Status: DC
  Filled 2023-07-25: qty 90, 90d supply, fill #0
  Filled 2023-10-31: qty 90, 90d supply, fill #1

## 2023-07-26 ENCOUNTER — Other Ambulatory Visit: Payer: Self-pay

## 2023-07-26 MED ORDER — ALBUTEROL SULFATE HFA 108 (90 BASE) MCG/ACT IN AERS
2.0000 | INHALATION_SPRAY | RESPIRATORY_TRACT | 5 refills | Status: DC | PRN
  Filled 2023-07-26: qty 6.7, 25d supply, fill #0

## 2023-07-31 ENCOUNTER — Other Ambulatory Visit: Payer: Self-pay

## 2023-08-01 ENCOUNTER — Other Ambulatory Visit (HOSPITAL_COMMUNITY): Payer: Self-pay

## 2023-08-02 ENCOUNTER — Other Ambulatory Visit: Payer: Self-pay

## 2023-08-03 ENCOUNTER — Other Ambulatory Visit: Payer: Self-pay

## 2023-08-03 MED ORDER — FLUTICASONE PROPIONATE 50 MCG/ACT NA SUSP
1.0000 | Freq: Two times a day (BID) | NASAL | 11 refills | Status: AC
  Filled 2023-08-03: qty 16, 60d supply, fill #0

## 2023-08-04 ENCOUNTER — Other Ambulatory Visit: Payer: Self-pay

## 2023-08-04 MED ORDER — AZELASTINE HCL 137 MCG/SPRAY NA SOLN
1.0000 | Freq: Two times a day (BID) | NASAL | 11 refills | Status: DC
  Filled 2023-08-04: qty 30, 60d supply, fill #0

## 2023-08-15 ENCOUNTER — Other Ambulatory Visit: Payer: Self-pay

## 2023-08-20 ENCOUNTER — Ambulatory Visit: Admitting: Dietician

## 2023-08-26 ENCOUNTER — Other Ambulatory Visit: Payer: Self-pay

## 2023-08-26 DIAGNOSIS — Z947 Corneal transplant status: Secondary | ICD-10-CM | POA: Diagnosis not present

## 2023-08-26 MED ORDER — PREDNISOLONE ACETATE 1 % OP SUSP: 1.0000 [drp] | Freq: Every day | OPHTHALMIC | 6 refills | Status: DC

## 2023-08-26 MED ORDER — OFLOXACIN 0.3 % OP SOLN
1.0000 [drp] | Freq: Three times a day (TID) | OPHTHALMIC | 0 refills | Status: DC
  Filled 2023-08-26: qty 10, 67d supply, fill #0

## 2023-08-29 DIAGNOSIS — Z8742 Personal history of other diseases of the female genital tract: Secondary | ICD-10-CM | POA: Diagnosis not present

## 2023-08-29 DIAGNOSIS — R87811 Vaginal high risk human papillomavirus (HPV) DNA test positive: Secondary | ICD-10-CM | POA: Diagnosis not present

## 2023-08-29 DIAGNOSIS — N893 Dysplasia of vagina, unspecified: Secondary | ICD-10-CM | POA: Diagnosis not present

## 2023-09-20 ENCOUNTER — Other Ambulatory Visit: Payer: Self-pay

## 2023-09-23 ENCOUNTER — Other Ambulatory Visit: Payer: Self-pay

## 2023-09-23 MED ORDER — LATANOPROST 0.005 % OP SOLN
1.0000 [drp] | Freq: Every evening | OPHTHALMIC | 5 refills | Status: AC
  Filled 2023-09-23: qty 2.5, 25d supply, fill #0
  Filled 2023-11-01: qty 2.5, 25d supply, fill #1
  Filled 2024-01-07: qty 2.5, 25d supply, fill #2

## 2023-10-01 ENCOUNTER — Encounter: Attending: Internal Medicine | Admitting: Dietician

## 2023-10-01 DIAGNOSIS — E639 Nutritional deficiency, unspecified: Secondary | ICD-10-CM

## 2023-10-01 NOTE — Progress Notes (Signed)
 Medical Nutrition Therapy  Appointment Start time:  [phone]  Appointment End time:  1430 Employee Wellness Visit 2 of 3 EID: [id number]  Primary concerns today: Hyperglycemia, Hyperlipidemia  Referral diagnosis: R73.9 - Hyperglycemia Preferred learning style: No preference indicated Learning readiness: Contemplating   NUTRITION ASSESSMENT   Clinical Medical Hx: Hyperglycemia, HLD, HTN, Breast Cancer, Renal Cell carcinoma, CVA Medications: Lovastatin , Metoprolol  Labs: Glucose - 130 mg/dL, Cholesterol - 826 mg/dL, LDL - 86 mg/dL Notable Signs/Symptoms: N/A   Lifestyle & Dietary Hx Pt reports traveling to [place]  in August and celebrated their birthday in September, states they were active on vacation but it was hard to stick to dietary changes during both of these times. Pt reports trying to get 4 oz. glass to have their OJ but hasn't been able to find one. Pt reports skipping breakfast M,W,F due to change in work area, will have first meal around lunch. Pt reports continuing to drink soda during the work day, states they feel they need for caffeine while working.    Estimated daily fluid intake: 64 oz Supplements: MVI, Calcium Sleep: Poorly, goes to bed late Stress / self-care: 6/10 Current average weekly physical activity: ADLs, Active at work   24-Hr Dietary Recall First Meal: Reuben sandwich, water Snack:  Second Meal: Chicken pie, greens, peas, Cheerwine Snack:  Third Meal:  Snack:  Beverages: Water, Soda    NUTRITION DIAGNOSIS  NB-1.1 Food and nutrition-related knowledge deficit As related to Hyperglycemia/HLD.  As evidenced by FBG of 130 mg/dL, high dose statin therapy, dietary recall high in SSBs, saturated fats..   NUTRITION INTERVENTION  Nutrition education (E-1) on the following topics:  Educated patient on the pathophysiology of diabetes. This includes why our bodies need circulating blood sugar, the relationship between insulin and blood sugar, and the results of  insulin resistance and/or pancreatic insufficiency on the development of diabetes. Educated patient on factors that contribute to elevation of blood sugars, such as stress, illness, injury,and food choices. Discussed the role that physical activity plays in lowering blood sugar. Educate patient on the three main macronutrients. Protein, fats, and carbohydrates. Discussed how each of these macronutrients affect blood sugar levels, especially carbohydrate, and the importance of eating a consistent amount of carbohydrate throughout the day.  Educate pt on the difference between LDL and HDL cholesterol.  Educate pt on the factors that can increase and decrease HDL cholesterol, including exercise to increase HDL, and tobacco use to decrease HDL.  Educate pt on factors that can elevate LDL cholesterol, including high dietary intake of saturated fats. Educate pt on identifying sources of saturated fats, and how to make alternative food choices to lower saturated fat intake. Educate pt on the role of soluble fiber in binding to cholesterol in the GI tract an eliminating it from the body. Educate pt on dietary sources of soluble fiber.  Educate pt on the potential dietary causes of hypertriglyceridemia, including concentrated sugars and sugar sweetened beverages. Educate on the role of elevated LDL,total cholesterol, and triglycerides on cardiovascular health. Educate pt on the role of physical activity in lowering LDL and increasing HDL cholesterol.    Handouts Provided Include  AVS  Learning Style & Readiness for Change Teaching method utilized: Visual & Auditory  Demonstrated degree of understanding via: Teach Back  Barriers to learning/adherence to lifestyle change: Apathy  Goals Established by Pt Use your 6 oz. Glass for your orange juice and leave some open space at the top when you fill it in the morning. Switch  to ZERO sugar sodas when having one during your work day. Purchase a 6 pack at the  grocery store and bring one 12 oz can with you to work. Begin walking on the track on Tuesday morning for 20-30 minutes now that the weather is cooling off.   MONITORING & EVALUATION Dietary intake, weekly physical activity, and labs in 2 months.  Next Steps  Patient is to follow up for wellness visit 3 of 3.

## 2023-10-01 NOTE — Patient Instructions (Addendum)
 Use your 6 oz. Glass for your orange juice and leave some open space at the top when you fill it in the morning.  Switch to ZERO sugar sodas when having one during your work day. Purchase a 6 pack at the grocery store and bring one 12 oz can with you to work.  Begin walking on the track on Tuesday morning for 20-30 minutes now that the weather is cooling off.

## 2023-10-10 DIAGNOSIS — R10814 Left lower quadrant abdominal tenderness: Secondary | ICD-10-CM | POA: Diagnosis not present

## 2023-10-10 DIAGNOSIS — K5792 Diverticulitis of intestine, part unspecified, without perforation or abscess without bleeding: Secondary | ICD-10-CM | POA: Diagnosis not present

## 2023-10-11 NOTE — Progress Notes (Signed)
 History of Present Illness:   Makayla Rice is a 73 y.o. female here for left lower quadrant abdominal pain over the last 2 days.  She denies any nausea, vomiting, fever, chills, or urinary symptoms.  Pain does get worse with sitting and with bending over and lifting up objects.  She has not lifted anything heavy.  She still has her appendix and gallbladder.  She does not recall ever having diverticulitis.  However, per chart review, she did have diverticulosis on her last colonoscopy, and has had/been treated for diverticulitis twice in the past.  Patient is otherwise eating and drinking well.  She also states that the pain does radiate to her left lower back at times.   Past Medical History:   Past Medical History:  Diagnosis Date  . Arthritis   . Bilateral carpal tunnel syndrome   . Glaucoma   . Hematuria    CT 2011 w/ heterogeneous enhancing 3.8 cm right renal mass & multiple small renal stones; Urology consultation with recommendation for partial nephrectomy.  Evaluated by [facility] 11/11; evaluated by [facility] urology 2012. Endoscopy Dr Ike 2012 with bleeding close to the mass; biopsies negative for malignancy.  Reevaluated at [facility] 11/13 (partial left nephrectomy) clear cell renal carcinoma.  . History of cancer 2018   right breast cancer, lumpectomy and radiation  . History of stroke 07/28/2013  . Hyperlipidemia   . Hypertension   . Kidney disease    renal cell cancer  . Ocular trauma of left eye   . Stroke (CMS/HHS-HCC) 8/09   Acute pontine CVA      Past Surgical History:   Past Surgical History:  Procedure Laterality Date  . EYE TRAUMA Left 2001  . CORNEAL EYE SURGERY Left 2001   PK (failed)  . COLONOSCOPY  01/29/2006   Dr. CHARM Punch @ [facility] - Nml  . CORNEAL EYE SURGERY Left 05/2009   repeat PK  . LENS EYE SURGERY Right 07/2009   phaco/PCIOL  . NEPHRECTOMY PARTIAL Left 11/13   [facility], clear cell renal carcinoma  . COLONOSCOPY  03/29/2016   Int Hemorrhoid: [facility] 03/2026  .  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 INSERTION INTRAOCULAR LENS PROSTHESIS SECONDARY IMPLANT Left 06/16/2019   Procedure: L - INSERTION OF INTRAOCULAR LENS PROSTHESIS (SECONDARY IMPLANT), NOT ASSOCIATED WITH CONCURRENT CATARACT REMOVAL;  Surgeon: Trudy Willma Hamel, MD;  Location: [facility] OR;  Service: Ophthalmology;  Laterality: Left;  . BREAST SURGERY Right    lumpectomy with radiation  . HYSTERECTOMY     TAH still has ovaries  . LENS EYE SURGERY Left    aphakia    Allergies:   Allergies  Allergen Reactions  . Augmentin [Amoxicillin -Pot Clavulanate] Nausea  . Sulfamethoxazole-Trimethoprim Rash  . Anastrozole  Unknown  . Ciprofloxacin  Headache  . Sulfa (Sulfonamide Antibiotics) Rash  . Tetracyclines Rash  . Zithromax  [Azithromycin ] Diarrhea  . Bacitracin Rash  . Bacitracin Rash  . Flagyl [Metronidazole Hcl] Rash    Current Medications:   Prior to Admission medications  Medication Sig Taking? Last Dose  *multivitamins,ther w-minerals/lycopene/lutein oral take once a day Yes Taking  albuterol  MDI, PROVENTIL , VENTOLIN , PROAIR , HFA 90 mcg/actuation inhaler INHALE 2 INHALATIONS INTO THE LUNGS EVERY 4 HOURS AS NEEDED FOR WHEEZING Yes PRN Not Currently Taking  aspirin 81 MG EC tablet Take 81 mg by mouth once daily. Yes  Taking  azelastine  (ASTELIN ) 137 mcg nasal spray Place 1 spray into both nostrils 2 (two) times daily. Yes PRN Not Currently Taking  CALCIUM CARBONATE/VITAMIN D3 (CALCIUM 600 + D,3, ORAL) Take by mouth. Once a day  Yes Taking  latanoprost  (XALATAN ) 0.005 % ophthalmic solution  Yes Taking  lovastatin  (MEVACOR ) 40 MG tablet TAKE 2 TABLETS BY MOUTH DAILY WITH DINNER Yes Taking  metoprolol  TARTrate (LOPRESSOR ) 25 MG tablet Take 1 tablet (25 mg total) by mouth daily. Yes  Taking  prednisoLONE  acetate (PRED FORTE ) 1 % ophthalmic suspension Place 1 drop into the left eye once daily While awake; starting after surgery Yes Taking  amoxicillin -clavulanate (AUGMENTIN) 875-125 mg tablet Take 1 tablet (875 mg total) by mouth 2 (two) times daily for 10 days    fluticasone  propionate (FLONASE ) 50 mcg/actuation nasal spray Place 1 spray into both nostrils 2 (two) times daily for 14 days. Patient not taking: Reported on 10/10/2023  Not Taking    Family History:   Family History  Problem Relation Name Age of Onset  . Glaucoma Sister         questionable GLC  . Myocardial Infarction (Heart attack) Sister    . Ovarian cancer Sister    . Myocardial Infarction (Heart attack) Mother    . High blood pressure (Hypertension) Mother    . Hyperlipidemia (Elevated cholesterol) Mother    . Heart failure Father (cat sx)   . High blood pressure (Hypertension) Father (cat sx)   . Hyperlipidemia (Elevated cholesterol) Father (cat sx)   . Cataracts Father (cat sx)   . Myocardial Infarction (Heart attack) Other half brother   . Macular degeneration Neg Hx    . Strabismus Neg Hx      Social History:   Social History   Socioeconomic History  . Marital status: Divorced  Tobacco Use  . Smoking status: Never  . Smokeless tobacco: Never  Vaping Use  . Vaping status: Never Used  Substance and Sexual Activity  . Alcohol use: No    Alcohol/week: 0.0 standard drinks of alcohol  . Drug use: Never  . Sexual activity: Yes    Partners: Male    Birth control/protection: Surgical   Social Drivers of Health   Financial Resource Strain: Low Risk  (07/25/2023)   Overall Financial Resource Strain (CARDIA)   . Difficulty of Paying Living Expenses: Not hard at all  Food Insecurity: No Food Insecurity (07/25/2023)   Hunger Vital Sign   . Worried About Programme researcher, broadcasting/film/video in the Last Year: Never true   . Ran Out of Food in the Last Year: Never true  Transportation Needs: No  Transportation Needs (07/25/2023)   PRAPARE - Transportation   . Lack of Transportation (Medical): No   . Lack of Transportation (Non-Medical): No  Physical Activity: Sufficiently Active (11/20/2016)   Received from [facility]   Exercise Vital Sign   . Days of Exercise per Week: 5 days   . Minutes of Exercise per Session: 70 min  Stress: No Stress Concern Present (11/20/2016)   Received from [facility] - Occupational Stress Questionnaire   . Feeling of Stress : Not at all  Social Connections: Moderately Integrated (11/20/2016)   Received from [facility]   Social Connection and Isolation Panel   . Frequency of Communication with Friends and Family: Twice a week   . Frequency of Social Gatherings with Friends and Family: Once a week   . Attends Religious  Services: More than 4 times per year   . Active Member of Clubs or Organizations: Yes   . Attends Banker Meetings: Never   . Marital Status: Divorced  Housing Stability: Low Risk  (07/25/2023)   Housing Stability Vital Sign   . Unable to Pay for Housing in the Last Year: No   . Number of Times Moved in the Last Year: 0   . Homeless in the Last Year: No    Review of Systems:   Review of Systems  Constitutional:  Negative for chills, fatigue, fever and unexpected weight change.  HENT:  Negative for ear pain, rhinorrhea and sore throat.   Eyes:  Negative for pain and visual disturbance.  Respiratory:  Negative for cough, chest tightness and shortness of breath.   Cardiovascular:  Negative for chest pain, palpitations and leg swelling.  Gastrointestinal:  Positive for abdominal pain. Negative for constipation, diarrhea, nausea and vomiting.  Genitourinary:  Negative for frequency and hematuria.  Musculoskeletal:  Negative for arthralgias, joint swelling and myalgias.  Skin:  Negative for rash.  Neurological:  Negative for headaches.  All other systems reviewed and are  negative.   Vitals:   Vitals:   10/10/23 1822  BP: (!) 180/84  Pulse: 75  Temp: (!) 31.7 C (89 F)  TempSrc: Oral  SpO2: 96%  Weight: 72.9 kg (160 lb 12.8 oz)  Height: 160 cm (5' 3)     Body mass index is 28.48 kg/m.  Physical Exam:   Physical Exam Vitals and nursing note reviewed.  Constitutional:      Appearance: She is well-developed.  HENT:     Head: Normocephalic and atraumatic.     Right Ear: Hearing, tympanic membrane, ear canal and external ear normal.     Left Ear: Hearing, tympanic membrane, ear canal and external ear normal.     Nose: Nose normal.     Mouth/Throat:     Pharynx: Oropharynx is clear. Uvula midline.  Eyes:     General: Lids are normal. Vision grossly intact.     Conjunctiva/sclera: Conjunctivae normal.     Pupils: Pupils are equal, round, and reactive to light.  Cardiovascular:     Rate and Rhythm: Normal rate and regular rhythm.     Pulses: Normal pulses.     Heart sounds: Normal heart sounds. No murmur heard. Pulmonary:     Effort: Pulmonary effort is normal.     Breath sounds: Normal breath sounds. No wheezing.  Abdominal:     General: Bowel sounds are normal.     Palpations: Abdomen is soft.     Tenderness: There is abdominal tenderness in the left lower quadrant.     Comments: Gastrointestinal system examined as above.  Musculoskeletal:        General: Normal range of motion.     Cervical back: Full passive range of motion without pain, normal range of motion and neck supple.  Skin:    General: Skin is warm and dry.  Neurological:     General: No focal deficit present.     Mental Status: She is alert and oriented to person, place, and time.  Psychiatric:        Attention and Perception: Attention and perception normal.        Mood and Affect: Mood and affect normal.        Speech: Speech normal.        Behavior: Behavior normal. Behavior is cooperative.  Thought Content: Thought content normal.     Assessment and  Plan:   Results for orders placed or performed in visit on 10/10/23  Urinalysis w/Microscopic  Result Value Ref Range   Color Light Yellow Colorless, Straw, Light Yellow, Yellow, Dark Yellow   Clarity Clear Clear   Specific Gravity 1.014 1.005 - 1.030   pH, Urine 6.5 5.0 - 8.0   Protein, Urinalysis Negative Negative mg/dL   Glucose, Urinalysis Negative Negative mg/dL   Ketones, Urinalysis Negative Negative mg/dL   Blood, Urinalysis Negative Negative   Nitrite, Urinalysis Negative Negative   Leukocyte Esterase, Urinalysis Negative Negative   Bilirubin, Urinalysis Negative Negative   Urobilinogen, Urinalysis 0.2 0.2 - 1.0 mg/dL   WBC, UA 6 (H) <=5 /hpf   Red Blood Cells, Urinalysis 4 (H) <=3 /hpf   Bacteria, Urinalysis 0-5 0 - 5 /hpf   Squamous Epithelial Cells, Urinalysis 8 /hpf    Diagnoses and all orders for this visit:  Diverticulitis  Left lower quadrant abdominal tenderness without rebound tenderness -     Urinalysis w/Microscopic  Other orders -     amoxicillin -clavulanate (AUGMENTIN) 875-125 mg tablet; Take 1 tablet (875 mg total) by mouth 2 (two) times daily for 10 days    Patient Instructions  As we discussed, based on clinical presentation, and previous history, and previous colonoscopy findings, this is likely diverticulitis.  Take the medications as directed.  You have listed nausea as a side effect of this medication.  I recommend you take it with her after food.  I recommend soft or bland diet tonight.  No heavy foods, no heavy meats, no spicy foods.  A BRAT diet such as bananas rice applesauce toast. For the next 12-24 hours, consume mostly clear liquids (water, Sprite, gingerale, broths, sorbet, etc.). Then advance to [employer] (Bananas, Rice, Applesauce, Toast; see accompanying literature). Once symptoms resolve completely and are gone for at least 24 hours, may resume regular diet.    Portions of this note were created using dictation software and may contain  typographical errors.   Patient received an After Visit Summary

## 2023-10-14 ENCOUNTER — Emergency Department
Admission: EM | Admit: 2023-10-14 | Discharge: 2023-10-14 | Disposition: A | Discharge status: Home / Self Care | Attending: Emergency Medicine | Admitting: Emergency Medicine

## 2023-10-14 ENCOUNTER — Emergency Department

## 2023-10-14 ENCOUNTER — Encounter: Payer: Self-pay | Admitting: Emergency Medicine

## 2023-10-14 ENCOUNTER — Other Ambulatory Visit: Payer: Self-pay

## 2023-10-14 DIAGNOSIS — R55 Syncope and collapse: Secondary | ICD-10-CM | POA: Diagnosis not present

## 2023-10-14 DIAGNOSIS — D72829 Elevated white blood cell count, unspecified: Secondary | ICD-10-CM | POA: Diagnosis not present

## 2023-10-14 DIAGNOSIS — R1032 Left lower quadrant pain: Secondary | ICD-10-CM | POA: Diagnosis not present

## 2023-10-14 DIAGNOSIS — I10 Essential (primary) hypertension: Secondary | ICD-10-CM | POA: Diagnosis not present

## 2023-10-14 DIAGNOSIS — Z8673 Personal history of transient ischemic attack (TIA), and cerebral infarction without residual deficits: Secondary | ICD-10-CM | POA: Diagnosis not present

## 2023-10-14 DIAGNOSIS — K529 Noninfective gastroenteritis and colitis, unspecified: Secondary | ICD-10-CM | POA: Diagnosis not present

## 2023-10-14 DIAGNOSIS — K5792 Diverticulitis of intestine, part unspecified, without perforation or abscess without bleeding: Secondary | ICD-10-CM | POA: Diagnosis not present

## 2023-10-14 DIAGNOSIS — N200 Calculus of kidney: Secondary | ICD-10-CM | POA: Diagnosis not present

## 2023-10-14 DIAGNOSIS — K5730 Diverticulosis of large intestine without perforation or abscess without bleeding: Secondary | ICD-10-CM | POA: Diagnosis not present

## 2023-10-14 LAB — COMPREHENSIVE METABOLIC PANEL WITH GFR
ALT: 18 U/L (ref 0–44)
AST: 27 U/L (ref 15–41)
Albumin: 4.1 g/dL (ref 3.5–5.0)
Alkaline Phosphatase: 52 U/L (ref 38–126)
Anion gap: 14 (ref 5–15)
BUN: 18 mg/dL (ref 8–23)
CO2: 24 mmol/L (ref 22–32)
Calcium: 9.8 mg/dL (ref 8.9–10.3)
Chloride: 103 mmol/L (ref 98–111)
Creatinine, Ser: 0.97 mg/dL (ref 0.44–1.00)
GFR, Estimated: >60 mL/min (ref >60)
Glucose, Bld: 130 mg/dL — ABNORMAL HIGH (ref 70–99)
Potassium: 4 mmol/L (ref 3.5–5.1)
Sodium: 141 mmol/L (ref 135–145)
Total Bilirubin: 0.6 mg/dL (ref 0.0–1.2)
Total Protein: 7.4 g/dL (ref 6.5–8.1)

## 2023-10-14 LAB — CBC WITH DIFFERENTIAL/PLATELET
Abs Immature Granulocytes: 0.14 K/uL — ABNORMAL HIGH (ref 0.00–0.07)
Basophils Absolute: 0.1 K/uL (ref 0.0–0.1)
Basophils Relative: 0 %
Eosinophils Absolute: 0.2 K/uL (ref 0.0–0.5)
Eosinophils Relative: 2 %
HCT: 48.3 % — ABNORMAL HIGH (ref 36.0–46.0)
Hemoglobin: 15.2 g/dL — ABNORMAL HIGH (ref 12.0–15.0)
Immature Granulocytes: 1 %
Lymphocytes Relative: 7 %
Lymphs Abs: 0.9 K/uL (ref 0.7–4.0)
MCH: 30.6 pg (ref 26.0–34.0)
MCHC: 31.5 g/dL (ref 30.0–36.0)
MCV: 97.2 fL (ref 80.0–100.0)
Monocytes Absolute: 1.2 K/uL — ABNORMAL HIGH (ref 0.1–1.0)
Monocytes Relative: 9 %
Neutro Abs: 11.7 K/uL — ABNORMAL HIGH (ref 1.7–7.7)
Neutrophils Relative %: 81 %
Platelets: 444 K/uL — ABNORMAL HIGH (ref 150–400)
RBC: 4.97 MIL/uL (ref 3.87–5.11)
RDW: 13.9 % (ref 11.5–15.5)
WBC: 14.3 K/uL — ABNORMAL HIGH (ref 4.0–10.5)
nRBC: 0 % (ref 0.0–0.2)

## 2023-10-14 LAB — URINALYSIS, ROUTINE W REFLEX MICROSCOPIC
Bacteria, UA: NONE SEEN
Bilirubin Urine: NEGATIVE
Glucose, UA: NEGATIVE mg/dL
Hgb urine dipstick: NEGATIVE
Ketones, ur: 5 mg/dL — AB
Nitrite: NEGATIVE
Protein, ur: 100 mg/dL — AB
Specific Gravity, Urine: 1.025 (ref 1.005–1.030)
WBC, UA: >50 WBC/hpf (ref 0–5)
pH: 5 (ref 5.0–8.0)

## 2023-10-14 LAB — TROPONIN I (HIGH SENSITIVITY)
Troponin I (High Sensitivity): 7 ng/L (ref <18)
Troponin I (High Sensitivity): 11 ng/L (ref <18)

## 2023-10-14 LAB — LIPASE, BLOOD: Lipase: 48 U/L (ref 11–51)

## 2023-10-14 MED ORDER — LACTATED RINGERS IV BOLUS
1000.0000 mL | Freq: Once | INTRAVENOUS | Status: AC
  Administered 2023-10-14: 1000 mL via INTRAVENOUS

## 2023-10-14 MED ORDER — IOHEXOL 300 MG/ML  SOLN
100.0000 mL | Freq: Once | INTRAMUSCULAR | Status: AC | PRN
  Administered 2023-10-14: 100 mL via INTRAVENOUS

## 2023-10-14 NOTE — ED Triage Notes (Signed)
 Pt arrives from home via [facility] C/O Syncope during vomiting with LOC.  Unsure if head hit. Recent diverticulitis DX and started Amoxicillin  yesterday.  Also complains of LLQ ABD pain.  Denies nausea at this time. Has been having N/V/D at home since last night.

## 2023-10-14 NOTE — ED Provider Notes (Signed)
 [facility] Provider Note    Event Date/Time   First MD Initiated Contact with Patient 10/14/23 0123     (approximate)   History   Chief Complaint Loss of Consciousness (N/V/D)   HPI  Makayla Rice is a 73 y.o. female with past medical history of hypertension, hyperlipidemia, stroke, and kidney stones who presents to the ED complaining of syncope.  Patient reports that she lost consciousness just prior to arrival while sitting on the toilet after she developed sudden onset nausea, vomiting, and diarrhea.  She does report that she had been feeling ill for the past couple of days with pain in the left lower quadrant of her abdomen as well as her left flank.  She was seen at urgent care for this, started on antibiotics for possible diverticulitis, but has continued to have pain.  She denies any fevers and had not had any vomiting or diarrhea until today.  She denies any dysuria or hematuria.  She did not have any chest pain or shortness of breath with the episode, denies hitting her head and denies headache or neck pain.     Physical Exam   Triage Vital Signs: ED Triage Vitals  Encounter Vitals Group     BP      Girls Systolic BP Percentile      Girls Diastolic BP Percentile      Boys Systolic BP Percentile      Boys Diastolic BP Percentile      Pulse      Resp      Temp      Temp src      SpO2      Weight      Height      Head Circumference      Peak Flow      Pain Score      Pain Loc      Pain Education      Exclude from Growth Chart     Most recent vital signs: Vitals:   10/14/23 0400 10/14/23 0500  BP: (!) 148/78 (!) 154/84  Pulse: 92 99  Resp: 12 19  Temp:    SpO2: 95% 97%    Constitutional: Alert and oriented. Eyes: Conjunctivae are normal. Head: Atraumatic. Nose: No congestion/rhinnorhea. Mouth/Throat: Mucous membranes are moist.  Neck: No midline cervical spine tenderness to palpation. Cardiovascular: Normal rate, regular  rhythm. Grossly normal heart sounds.  2+ radial pulses bilaterally. Respiratory: Normal respiratory effort.  No retractions. Lungs CTAB. Gastrointestinal: Soft and tender to palpation in the left lower quadrant with no rebound or guarding, no CVA tenderness bilaterally. No distention. Musculoskeletal: No lower extremity tenderness nor edema.  Neurologic:  Normal speech and language. No gross focal neurologic deficits are appreciated.    ED Results / Procedures / Treatments   Labs (all labs ordered are listed, but only abnormal results are displayed) Labs Reviewed  CBC WITH DIFFERENTIAL/PLATELET - Abnormal; Notable for the following components:      Result Value   WBC 14.3 (*)    Hemoglobin 15.2 (*)    HCT 48.3 (*)    Platelets 444 (*)    Neutro Abs 11.7 (*)    Monocytes Absolute 1.2 (*)    Abs Immature Granulocytes 0.14 (*)    All other components within normal limits  COMPREHENSIVE METABOLIC PANEL WITH GFR - Abnormal; Notable for the following components:   Glucose, Bld 130 (*)    All other components within normal limits  URINALYSIS,  ROUTINE W REFLEX MICROSCOPIC - Abnormal; Notable for the following components:   Color, Urine AMBER (*)    APPearance CLOUDY (*)    Ketones, ur 5 (*)    Protein, ur 100 (*)    Leukocytes,Ua SMALL (*)    All other components within normal limits  LIPASE, BLOOD  TROPONIN I (HIGH SENSITIVITY)  TROPONIN I (HIGH SENSITIVITY)     EKG  ED ECG REPORT I, Carlin Palin, the attending physician, personally viewed and interpreted this ECG.   Date: 10/14/2023  EKG Time: 1:26  Rate: 94  Rhythm: normal sinus rhythm  Axis: Normal  Intervals:left bundle branch block  ST&T Change: None  RADIOLOGY CT abdomen/pelvis reviewed and interpreted by me with no inflammatory changes, focal fluid collections, or dilated bowel loops.  PROCEDURES:  Critical Care performed: No  Procedures   MEDICATIONS ORDERED IN ED: Medications  lactated ringers  bolus  1,000 mL (0 mLs Intravenous Stopped 10/14/23 0343)  iohexol  (OMNIPAQUE ) 300 MG/ML solution 100 mL (100 mLs Intravenous Contrast Given 10/14/23 0232)     IMPRESSION / MDM / ASSESSMENT AND PLAN / ED COURSE  I reviewed the triage vital signs and the nursing notes.                              73 y.o. female with past medical history of hypertension, hyperlipidemia, stroke, and kidney stones who presents to the ED complaining of a few days of left lower quadrant abdominal pain, had a syncopal episode this evening after developing sudden onset vomiting and diarrhea.  Patient's presentation is most consistent with acute presentation with potential threat to life or bodily function.  Differential diagnosis includes, but is not limited to, arrhythmia, ACS, vasovagal episode, orthostatic hypotension, gastroenteritis, diverticulitis, colitis, kidney stone, UTI.  Patient nontoxic-appearing and in no acute distress, vital signs are unremarkable.  No evidence of trauma to her head or neck and patient has a nonfocal neurologic exam.  EKG shows no evidence of arrhythmia or ischemia, does show left bundle branch block that is new compared to EKG from 6 years ago, but would favor vasovagal episode given association with GI symptoms.  We will observe on cardiac monitor, screen troponin in addition to CBC, CMP, and lipase.  She has tenderness in the left lower quadrant of her abdomen, will check CT of her abdomen/pelvis.  Patient declines pain or nausea medication, will hydrate with IV fluids.  CT abdomen/pelvis shows diverticulosis without evidence of diverticulitis and no other acute finding.  Labs with leukocytosis but no significant anemia, electrolyte abnormality, or AKI.  LFTs and lipase are unremarkable and 2 sets of troponin within normal limits.  Urinalysis appears contaminated, no urinary symptoms to suggest UTI.  Suspect vasovagal etiology of her syncope given association with vomiting and diarrhea.  Patient  is feeling better on reassessment and appropriate for outpatient management, counseled to follow-up with her PCP and cardiology for syncope.  She was counseled to return to the ED for new or worsening symptoms, patient agrees with plan.      FINAL CLINICAL IMPRESSION(S) / ED DIAGNOSES   Final diagnoses:  Syncope, unspecified syncope type  Gastroenteritis  Left lower quadrant abdominal pain     Rx / DC Orders   ED Discharge Orders          Ordered    Ambulatory referral to Cardiology        10/14/23 [phone]  Note:  This document was prepared using Dragon voice recognition software and may include unintentional dictation errors.   Willo Dunnings, MD 10/14/23 [phone]

## 2023-10-15 DIAGNOSIS — K529 Noninfective gastroenteritis and colitis, unspecified: Secondary | ICD-10-CM | POA: Diagnosis not present

## 2023-10-15 DIAGNOSIS — D72829 Elevated white blood cell count, unspecified: Secondary | ICD-10-CM | POA: Diagnosis not present

## 2023-10-17 ENCOUNTER — Other Ambulatory Visit: Payer: Self-pay

## 2023-10-17 DIAGNOSIS — K529 Noninfective gastroenteritis and colitis, unspecified: Secondary | ICD-10-CM | POA: Diagnosis not present

## 2023-10-17 DIAGNOSIS — Z23 Encounter for immunization: Secondary | ICD-10-CM | POA: Diagnosis not present

## 2023-10-17 DIAGNOSIS — M542 Cervicalgia: Secondary | ICD-10-CM | POA: Diagnosis not present

## 2023-10-17 DIAGNOSIS — D72829 Elevated white blood cell count, unspecified: Secondary | ICD-10-CM | POA: Diagnosis not present

## 2023-10-17 MED ORDER — MELOXICAM 15 MG PO TABS
15.0000 mg | ORAL_TABLET | Freq: Every day | ORAL | 0 refills | Status: DC
  Filled 2023-10-17: qty 30, 30d supply, fill #0

## 2023-10-17 MED ORDER — TIZANIDINE HCL 2 MG PO CAPS
2.0000 mg | ORAL_CAPSULE | Freq: Two times a day (BID) | ORAL | 0 refills | Status: DC | PRN
  Filled 2023-10-17: qty 28, 14d supply, fill #0

## 2023-10-21 ENCOUNTER — Other Ambulatory Visit: Payer: Self-pay

## 2023-10-21 DIAGNOSIS — Z947 Corneal transplant status: Secondary | ICD-10-CM | POA: Diagnosis not present

## 2023-10-21 MED ORDER — OFLOXACIN 0.3 % OP SOLN
1.0000 [drp] | Freq: Three times a day (TID) | OPHTHALMIC | 0 refills | Status: AC
  Filled 2023-10-21: qty 5, 10d supply, fill #0

## 2023-10-21 MED ORDER — PREDNISOLONE ACETATE 1 % OP SUSP
1.0000 [drp] | Freq: Every day | OPHTHALMIC | 6 refills | Status: DC
  Filled 2023-10-21: qty 10, 75d supply, fill #0

## 2023-10-23 ENCOUNTER — Other Ambulatory Visit: Payer: Self-pay

## 2023-10-31 ENCOUNTER — Other Ambulatory Visit: Payer: Self-pay

## 2023-11-01 ENCOUNTER — Other Ambulatory Visit: Payer: Self-pay

## 2023-11-05 DIAGNOSIS — R739 Hyperglycemia, unspecified: Secondary | ICD-10-CM | POA: Diagnosis not present

## 2023-11-05 DIAGNOSIS — I10 Essential (primary) hypertension: Secondary | ICD-10-CM | POA: Diagnosis not present

## 2023-11-05 DIAGNOSIS — E785 Hyperlipidemia, unspecified: Secondary | ICD-10-CM | POA: Diagnosis not present

## 2023-11-12 ENCOUNTER — Ambulatory Visit

## 2023-11-12 DIAGNOSIS — E785 Hyperlipidemia, unspecified: Secondary | ICD-10-CM | POA: Diagnosis not present

## 2023-11-12 DIAGNOSIS — Z780 Asymptomatic menopausal state: Secondary | ICD-10-CM | POA: Diagnosis not present

## 2023-11-12 DIAGNOSIS — H401130 Primary open-angle glaucoma, bilateral, stage unspecified: Secondary | ICD-10-CM | POA: Diagnosis not present

## 2023-11-12 DIAGNOSIS — Z1331 Encounter for screening for depression: Secondary | ICD-10-CM | POA: Diagnosis not present

## 2023-11-12 DIAGNOSIS — R7303 Prediabetes: Secondary | ICD-10-CM | POA: Diagnosis not present

## 2023-11-12 DIAGNOSIS — Z0000 Encounter for general adult medical examination without abnormal findings: Secondary | ICD-10-CM | POA: Diagnosis not present

## 2023-11-12 DIAGNOSIS — I10 Essential (primary) hypertension: Secondary | ICD-10-CM | POA: Diagnosis not present

## 2023-11-12 DIAGNOSIS — Z85528 Personal history of other malignant neoplasm of kidney: Secondary | ICD-10-CM | POA: Diagnosis not present

## 2023-11-12 DIAGNOSIS — Z853 Personal history of malignant neoplasm of breast: Secondary | ICD-10-CM | POA: Diagnosis not present

## 2023-11-12 DIAGNOSIS — Z8673 Personal history of transient ischemic attack (TIA), and cerebral infarction without residual deficits: Secondary | ICD-10-CM | POA: Diagnosis not present

## 2023-11-12 NOTE — Progress Notes (Unsigned)
 Cardiology Office Note   Date:  11/14/2023  ID:  Makayla Rice, DOB 1950-01-14, MRN [id number] PCP: Fernande Ophelia JINNY DOUGLAS, MD  [facility] Providers Cardiologist:  Caron Poser, MD     History of Present Illness Makayla Rice is a 73 y.o. female PMH breast cancer, HLD, who presents for further evaluation and management of syncope.  Patient was seen in the ED on 10/14/2023 for this issue.  She was reportedly on the commode and then developed sudden onset nausea, vomiting, and diarrhea and then lost consciousness briefly.  She was also noted to have a new LBBB.  Serial troponin negative.  CMP and CBC without obvious cause.  Her presentation was ultimately felt to be most consistent with a vasovagal episode, so she was discharged from the ED without further testing.  On interview today, patient notes she has not had any recurrence of episodes.  She denies any palpitations.  She says she has never had a syncopal episode prior to that occurrence.  Relevant CVD History -New LBBB 10/14/2023 (not present on ECG from 02/2017) -Normal exercise stress SPECT OSH 10/2022   ROS: Pt denies any chest discomfort, jaw pain, arm pain, palpitations, syncope, presyncope, orthopnea, PND, or LE edema.  Studies Reviewed I have independently reviewed the patient's ECG, previous cardiac testing, previous medical records.  Physical Exam VS:  BP (!) 162/94 (BP Location: Right Arm, Patient Position: Sitting, Cuff Size: Normal)   Pulse 72   Ht 5' 3 (1.6 m)   Wt 158 lb (71.7 kg)   SpO2 97%   BMI 27.99 kg/m   Orthostatic VS for the past 24 hrs (Last 3 readings):  BP- Lying Pulse- Lying BP- Sitting Pulse- Sitting BP- Standing at 0 minutes Pulse- Standing at 0 minutes BP- Standing at 3 minutes Pulse- Standing at 3 minutes  11/14/23 1423 161/71 72 173/83 78 164/82 74 160/79 73      Wt Readings from Last 3 Encounters:  11/14/23 158 lb (71.7 kg)  10/14/23 151 lb 4.8 oz (68.6 kg)  04/08/23 158 lb (71.7 kg)     GEN: No acute distress. NECK: No JVD; No carotid bruits. CARDIAC: RRR, no murmurs, rubs, gallops. RESPIRATORY:  Clear to auscultation. EXTREMITIES:  Warm and well-perfused. No edema.  ASSESSMENT AND PLAN Syncope and collapse Patient presents for follow-up of an isolated syncopal episode that occurred while on the commode in the setting of an gastrointestinal illness.  She has not had any recurrence since and did not have any episodes before then.  She has normal orthostatic vital signs in office today.  This is most consistent with a vasovagal etiology.  However, she did have a new left bundle branch block which has since resolved.  Will need to rule out any structural heart issues or arrhythmogenic issues.  Plan: - Echocardiogram to rule out structural etiology of syncope - Zio monitor to rule out arrhythmogenic etiology  New LBBB During patient's visit to the ED, her ECG showed a new left bundle branch block.  However, this has interestingly resolved today.  She may have had some infranodal conduction issues while all this is going on.  As above, we will obtain a monitor to see if she is having any ongoing nodal or infranodal conduction abnormalities.  HTN Moderately elevated today.  She notes that she is working with her PCP on this issue.  Given her undifferentiated syncope, we will not start any treatments today.  If her cardiac testing is normal, I encouraged her to  meet with her PCP regarding ongoing treatment of hypertension.        Dispo: RTC as needed  Signed, Caron Poser, MD

## 2023-11-14 ENCOUNTER — Ambulatory Visit

## 2023-11-14 ENCOUNTER — Ambulatory Visit (INDEPENDENT_AMBULATORY_CARE_PROVIDER_SITE_OTHER)

## 2023-11-14 VITALS — BP 162/94 | HR 72 | Ht 63.0 in | Wt 158.0 lb

## 2023-11-14 DIAGNOSIS — R55 Syncope and collapse: Secondary | ICD-10-CM

## 2023-11-14 DIAGNOSIS — I447 Left bundle-branch block, unspecified: Secondary | ICD-10-CM

## 2023-11-14 DIAGNOSIS — I10 Essential (primary) hypertension: Secondary | ICD-10-CM | POA: Diagnosis not present

## 2023-11-14 NOTE — Patient Instructions (Signed)
 Medication Instructions:  Your physician recommends that you continue on your current medications as directed. Please refer to the Current Medication list given to you today.  *If you need a refill on your cardiac medications before your next appointment, please call your pharmacy*  Lab Work: No labs ordered today  If you have labs (blood work) drawn today and your tests are completely normal, you will receive your results only by: MyChart Message (if you have MyChart) OR A paper copy in the mail If you have any lab test that is abnormal or we need to change your treatment, we will call you to review the results.  Testing/Procedures: Your physician has requested that you have an echocardiogram. Echocardiography is a painless test that uses sound waves to create images of your heart. It provides your doctor with information about the size and shape of your heart and how well your heart's chambers and valves are working.   You may receive an ultrasound enhancing agent through an IV if needed to better visualize your heart during the echo. This procedure takes approximately one hour.  There are no restrictions for this procedure.  This will take place at [address] [facility] [address] [facility], [place] [address]  Please note: We ask at that you not bring children with you during ultrasound (echo/ vascular) testing. Due to room size and safety concerns, children are not allowed in the ultrasound rooms during exams. Our front office staff cannot provide observation of children in our lobby area while testing is being conducted. An adult accompanying a patient to their appointment will only be allowed in the ultrasound room at the discretion of the ultrasound technician under special circumstances. We apologize for any inconvenience.   ZIO XT- Long Term Monitor Instructions  Your physician has requested you wear a ZIO patch monitor for 14 days.  This is a single patch monitor. [employer]  supplies one patch monitor per enrollment. Additional stickers are not available. Please do not apply patch if you will be having a Nuclear Stress Test, Echocardiogram, Cardiac CT, MRI, or Chest Xray during the period you would be wearing the monitor. The patch cannot be worn during these tests. You cannot remove and re-apply the ZIO XT patch monitor.  Your ZIO patch monitor will be mailed 3 day USPS to your address on file. It may take 3-5 days to receive your monitor after you have been enrolled. Once you have received your monitor, please review the enclosed instructions. Your monitor has already been registered assigning a specific monitor serial number to you.  Billing and Patient Assistance Program Information  We have supplied [employer] with any of your insurance information on file for billing purposes.  [employer] offers a sliding scale Patient Assistance Program for patients that do not have insurance, or whose insurance does not completely cover the cost of the ZIO monitor.  You must apply for the Patient Assistance Program to qualify for this discounted rate.  To apply, please call [employer] at [phone], select option 4, select option 2, ask to apply for Patient Assistance Program. Meredeth will ask your household income, and how many people are in your household. They will quote your out-of-pocket cost based on that information. [employer] will also be able to set up a 52-month, interest-free payment plan if needed.  Applying the monitor   Shave hair from upper left chest.  Hold abrader disc by orange tab. Rub abrader in 40 strokes over the upper left chest as indicated in  your monitor instructions.  Clean area with 4 enclosed alcohol pads. Let dry.  Apply patch as indicated in monitor instructions. Patch will be placed under collarbone on left side of chest with arrow pointing upward.  Rub patch adhesive wings for 2 minutes. Remove white label marked 1. Remove the white label marked 2. Rub  patch adhesive wings for 2 additional minutes.  While looking in a mirror, press and release button in center of patch. A small green light will flash 3-4 times. This will be your only indicator that the monitor has been turned on.   After Applying Monitor: Do not shower for the first 24 hours. You may shower after the first 24 hours.  Press the button if you feel a symptom. You will hear a small click. Record Date, Time and Symptom in the Patient Logbook.   After Completing 14 Days: When you are ready to remove the patch, follow instructions on the last 2 pages of Patient Logbook.  Stick patch monitor into the tabs at the bottom of the return box.  Place Patient Logbook in the blue and white box. Use locking tab on box and tape box closed securely. The blue and white box has prepaid postage on it. Please place it in the mailbox as soon as possible. Your physician should have your test results approximately 7-14 days after the monitor has been mailed back to [facility].   Troubleshooting: Call [facility] at [phone] if you have questions regarding your ZIO XT patch monitor.  Call them immediately if you see an orange light blinking on your monitor.  If your monitor falls off in less than 4 days, contact our Monitor department at [phone].  If your monitor becomes loose or falls off after 4 days call [employer] at [phone] for suggestions on securing your monitor.   Follow-Up: At [facility], you and your health needs are our priority.  As part of our continuing mission to provide you with exceptional heart care, our providers are all part of one team.  This team includes your primary Cardiologist (physician) and Advanced Practice Providers or APPs (Physician Assistants and Nurse Practitioners) who all work together to provide you with the care you need, when you need it.  Your next appointment:  As needed   Provider:  Caron Poser, MD   We  recommend signing up for the patient portal called MyChart.  Sign up information is provided on this After Visit Summary.  MyChart is used to connect with patients for Virtual Visits (Telemedicine).  Patients are able to view lab/test results, encounter notes, upcoming appointments, etc.  Non-urgent messages can be sent to your provider as well.   To learn more about what you can do with MyChart, go to [email or web address].

## 2023-11-18 ENCOUNTER — Other Ambulatory Visit: Payer: Self-pay

## 2023-11-19 DIAGNOSIS — M8588 Other specified disorders of bone density and structure, other site: Secondary | ICD-10-CM | POA: Diagnosis not present

## 2023-11-20 ENCOUNTER — Other Ambulatory Visit: Payer: Self-pay

## 2023-11-20 MED ORDER — LOVASTATIN 40 MG PO TABS
80.0000 mg | ORAL_TABLET | Freq: Every day | ORAL | 3 refills | Status: AC
  Filled 2023-11-20: qty 180, 90d supply, fill #0

## 2023-12-11 DIAGNOSIS — I447 Left bundle-branch block, unspecified: Secondary | ICD-10-CM | POA: Diagnosis not present

## 2023-12-11 DIAGNOSIS — R55 Syncope and collapse: Secondary | ICD-10-CM | POA: Diagnosis not present

## 2023-12-12 ENCOUNTER — Ambulatory Visit: Payer: Self-pay

## 2023-12-12 DIAGNOSIS — I447 Left bundle-branch block, unspecified: Secondary | ICD-10-CM | POA: Diagnosis not present

## 2023-12-12 DIAGNOSIS — R55 Syncope and collapse: Secondary | ICD-10-CM | POA: Diagnosis not present

## 2023-12-13 NOTE — Telephone Encounter (Signed)
 Patient came by office States that she would like to know results of heart monitor  Informed patient they are on mychart but she does not get on mychart Please call to discuss

## 2023-12-17 ENCOUNTER — Ambulatory Visit: Admitting: Dietician

## 2023-12-24 DIAGNOSIS — H401131 Primary open-angle glaucoma, bilateral, mild stage: Secondary | ICD-10-CM | POA: Diagnosis not present

## 2023-12-24 DIAGNOSIS — H401111 Primary open-angle glaucoma, right eye, mild stage: Secondary | ICD-10-CM | POA: Diagnosis not present

## 2023-12-25 ENCOUNTER — Encounter: Payer: Self-pay | Admitting: *Deleted

## 2023-12-30 DIAGNOSIS — Z947 Corneal transplant status: Secondary | ICD-10-CM | POA: Diagnosis not present

## 2024-01-07 ENCOUNTER — Other Ambulatory Visit: Payer: Self-pay

## 2024-01-07 ENCOUNTER — Ambulatory Visit

## 2024-01-07 DIAGNOSIS — R55 Syncope and collapse: Secondary | ICD-10-CM

## 2024-01-07 DIAGNOSIS — I447 Left bundle-branch block, unspecified: Secondary | ICD-10-CM | POA: Diagnosis not present

## 2024-01-07 LAB — ECHOCARDIOGRAM COMPLETE
AV Mean grad: 3 mmHg
AV Peak grad: 5.7 mmHg
Ao pk vel: 1.19 m/s
Area-P 1/2: 3.39 cm2
S' Lateral: 2.8 cm

## 2024-01-14 ENCOUNTER — Other Ambulatory Visit: Payer: Self-pay

## 2024-01-14 ENCOUNTER — Emergency Department

## 2024-01-14 ENCOUNTER — Encounter: Payer: Self-pay | Admitting: Emergency Medicine

## 2024-01-14 DIAGNOSIS — Z79899 Other long term (current) drug therapy: Secondary | ICD-10-CM | POA: Diagnosis not present

## 2024-01-14 DIAGNOSIS — I10 Essential (primary) hypertension: Secondary | ICD-10-CM | POA: Diagnosis not present

## 2024-01-14 DIAGNOSIS — Z85528 Personal history of other malignant neoplasm of kidney: Secondary | ICD-10-CM | POA: Insufficient documentation

## 2024-01-14 DIAGNOSIS — Z853 Personal history of malignant neoplasm of breast: Secondary | ICD-10-CM | POA: Diagnosis not present

## 2024-01-14 DIAGNOSIS — R059 Cough, unspecified: Secondary | ICD-10-CM | POA: Diagnosis present

## 2024-01-14 DIAGNOSIS — Z8673 Personal history of transient ischemic attack (TIA), and cerebral infarction without residual deficits: Secondary | ICD-10-CM | POA: Diagnosis not present

## 2024-01-14 DIAGNOSIS — J069 Acute upper respiratory infection, unspecified: Secondary | ICD-10-CM | POA: Diagnosis not present

## 2024-01-14 DIAGNOSIS — Z7982 Long term (current) use of aspirin: Secondary | ICD-10-CM | POA: Diagnosis not present

## 2024-01-14 NOTE — ED Triage Notes (Signed)
 Pt arrives ambulatory to triage, gait steady, no acute distress noted c/o cough and fever since yesterday, pt was exposed to covid on Saturday. Denies cp.

## 2024-01-15 ENCOUNTER — Emergency Department
Admission: EM | Admit: 2024-01-15 | Discharge: 2024-01-15 | Disposition: A | Discharge status: Home / Self Care | Attending: Emergency Medicine | Admitting: Emergency Medicine

## 2024-01-15 ENCOUNTER — Other Ambulatory Visit: Payer: Self-pay

## 2024-01-15 DIAGNOSIS — J069 Acute upper respiratory infection, unspecified: Secondary | ICD-10-CM

## 2024-01-15 MED ORDER — PAXLOVID (300/100) 20 X 150 MG & 10 X 100MG PO TBPK
ORAL_TABLET | ORAL | 0 refills | Status: AC
  Filled 2024-01-15: qty 30, 5d supply, fill #0

## 2024-01-15 MED ORDER — ACETAMINOPHEN 500 MG PO TABS
1000.0000 mg | ORAL_TABLET | Freq: Once | ORAL | Status: AC
  Administered 2024-01-15: 1000 mg via ORAL
  Filled 2024-01-15: qty 2

## 2024-01-15 MED ORDER — PAXLOVID (300/100) 20 X 150 MG & 10 X 100MG PO TBPK: 3.0000 | ORAL_TABLET | Freq: Two times a day (BID) | ORAL | 0 refills | Status: AC

## 2024-01-15 NOTE — Discharge Instructions (Signed)
You have been diagnosed with COVID 19.  This is a virus that can cause many different symptoms and can be extremely contagious.    You may be eligible for outpatient antiviral treatments for COVID 19 such as Paxlovid, Molnupiravir if you are within the first 5 days of symptoms. You do not need antibiotics for COVID 19 since it is a virus.  You may use over the counter medications to help manage your symptoms at home.    You may take Tylenol 1000 mg every 6 hours as needed for pain, fever.  Do not take more than 4000 mg of Tylenol (acetaminophen) in a 24 hour period.  Please rest and drink plenty of fluids.  You will need to quarantine from others for five days (first day of symptoms is DAY ZERO).  If your symptoms are improving or resolved at the end of this time frame, you may come out of quarantine but will need to wear a well fitted mask when around others for the next 5 days.   The best way to protect yourself and others from COVID 19 and potential long term complications is to be vaccinated and receive boosters as recommended by the [facility] and your primary care provider.  If you develop shortness of breath, blue lips or blue fingertips, vomiting that does not stop, chest pain, confusion, become severely weak or feel you may pass out, please return to the closest emergency department.

## 2024-01-15 NOTE — ED Notes (Signed)
 AVS provided by edp was reviewed with the pt. Pt verbalized understanding with no additional questions at this time. Pt provided with printed prescription and work note.

## 2024-01-15 NOTE — ED Provider Notes (Signed)
 "  [facility] Provider Note    Event Date/Time   First MD Initiated Contact with Patient 01/15/24 0158     (approximate)   History   Cough and Fever   HPI  Makayla Rice is a 74 y.o. female with history of hypertension, hyperlipidemia, stroke who presents to the emergency department with cough, congestion, fever today.  Was exposed to someone with COVID-19 several days ago.  No vomiting or diarrhea.  No chest pain or shortness of breath.   History provided by patient.    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    Past Surgical History:  Procedure Laterality Date   ABDOMINAL HYSTERECTOMY     BREAST BIOPSY Right 11/13/2016   Affirm Bx-path DCIS   BREAST EXCISIONAL BIOPSY Right 03/04/2017   lumpectomy wit np DCIS    BREAST LUMPECTOMY Right 02/2017   DCIS   BREAST LUMPECTOMY WITH NEEDLE LOCALIZATION Right 03/04/2017   Procedure: BREAST LUMPECTOMY WITH NEEDLE LOCALIZATION;  Surgeon: Dessa Reyes ORN, MD;  Location: [facility] ORS;  Service: General;  Laterality: Right;   COLONOSCOPY  2018   EYE SURGERY  2003   KIDNEY SURGERY  2013   ROBOTIC SURGERY   PARTIAL NEPHRECTOMY  2013    MEDICATIONS:  Prior to Admission medications  Medication Sig Start Date End Date Taking? Authorizing Provider  albuterol  (PROVENTIL  HFA;VENTOLIN  HFA) 108 (90 Base) MCG/ACT inhaler Inhale 2 puffs into the lungs every 6 (six) hours as needed for wheezing or shortness of breath. 04/16/16   Gasper Devere ORN, PA-C  aspirin EC 81 MG tablet Take 81 mg daily by mouth.     [provider]  azelastine  (ASTELIN ) 0.1 %  nasal spray Place 1 spray into both nostrils daily as needed. For cold symptoms 02/12/17   [provider]  benzonatate  (TESSALON  PERLES) 100 MG capsule Take 1 capsule (100 mg total) by mouth 3 (three) times daily as needed for cough. 12/09/17   Steffanie Lukes, PA-C  Blood Pressure Monitoring (OMRON 3 SERIES BP MONITOR) DEVI use to check blood pressure 07/25/21   Nicks, Kristie J, RPH  Calcium Carbonate-Vit D-Min (CALCIUM 600+D3 PLUS MINERALS PO) Take 1 tablet by mouth daily.    [provider]  fluticasone  (FLONASE ) 50 MCG/ACT nasal spray Place 1 spray into both nostrils 2 (two) times daily for 14 days. 08/02/23     Fluticasone  Propionate, Inhal, (FLOVENT  DISKUS) 100 MCG/ACT AEPB Inhale 1 inhalation into the lungs 2 (two) times daily for 14 days 08/22/21     ketoconazole  (NIZORAL ) 2 % shampoo Apply 1 Application topically daily as body wash. Leave on two minutes before rinsing. Patient taking differently: Apply 1 Application topically as needed. 04/30/22     latanoprost  (XALATAN ) 0.005 % ophthalmic solution Place 1 drop into both eyes every evening. 09/23/23 09/22/24    levobunolol (BETAGAN) 0.5 % ophthalmic solution Place 1 drop into both eyes daily.  04/24/10   [provider]  loratadine (CLARITIN) 10 MG tablet Take 10 mg by mouth daily as needed  for allergies.    [provider]  lovastatin  (MEVACOR ) 40 MG tablet Take 2 tablets (80 mg total) by mouth daily with supper. 11/20/23     metoprolol  tartrate (LOPRESSOR ) 25 MG tablet Take 25 mg by mouth every morning. TAKE ONE TABLET ONCE DAILY FOR BLOOD PRESSURE 02/18/14   [provider]  Multiple Vitamin (MULTIVITAMIN WITH MINERALS) TABS tablet Take 1 tablet by mouth daily. Centrum Silver    [provider]  ofloxacin  (OCUFLOX ) 0.3 % ophthalmic solution Place 1 drop into the left eye 3 (three) times daily. 10/21/23       Physical Exam   Triage Vital Signs: ED Triage Vitals  Encounter Vitals Group      BP 01/14/24 2330 (!) 170/99     Girls Systolic BP Percentile --      Girls Diastolic BP Percentile --      Boys Systolic BP Percentile --      Boys Diastolic BP Percentile --      Pulse Rate 01/14/24 2330 (!) 103     Resp 01/14/24 2330 17     Temp 01/14/24 2330 99.6 F (37.6 C)     Temp Source 01/14/24 2330 Oral     SpO2 01/14/24 2330 96 %     Weight --      Height --      Head Circumference --      Peak Flow --      Pain Score 01/14/24 2343 5     Pain Loc --      Pain Education --      Exclude from Growth Chart --     Most recent vital signs: Vitals:   01/14/24 2330 01/15/24 0235  BP: (!) 170/99 (!) 172/88  Pulse: (!) 103 98  Resp: 17 19  Temp: 99.6 F (37.6 C) 98.1 F (36.7 C)  SpO2: 96% 99%    CONSTITUTIONAL: Alert, responds appropriately to questions. Well-appearing; well-nourished HEAD: Normocephalic, atraumatic EYES: Conjunctivae clear, pupils appear equal, sclera nonicteric ENT: normal nose; moist mucous membranes NECK: Supple, normal ROM CARD: RRR; S1 and S2 appreciated RESP: Normal chest excursion without splinting or tachypnea; breath sounds clear and equal bilaterally; no wheezes, no rhonchi, no rales, no hypoxia or respiratory distress, speaking full sentences ABD/GI: Non-distended; soft, non-tender, no rebound, no guarding, no peritoneal signs BACK: The back appears normal EXT: Normal ROM in all joints; no deformity noted, no edema SKIN: Normal color for age and race; warm; no rash on exposed skin NEURO: Moves all extremities equally, normal speech PSYCH: The patient's mood and manner are appropriate.   ED Results / Procedures / Treatments   LABS: (all labs ordered are listed, but only abnormal results are displayed) Labs Reviewed - No data to display   EKG:  EKG Interpretation Date/Time:    Ventricular Rate:    PR Interval:    QRS Duration:    QT Interval:    QTC Calculation:   R Axis:      Text Interpretation:            RADIOLOGY: My personal review and interpretation of imaging: Chest x-ray clear.  I have personally reviewed all radiology reports.   DG Chest 2 View Result Date: 01/15/2024 EXAM: 2 VIEW(S) XRAY OF THE CHEST 01/15/2024 12:05:00 AM COMPARISON: Comparison with 07/16/2017. CLINICAL HISTORY: Cough and fever since yesterday. Recent exposure to COVID-19. FINDINGS: LUNGS AND PLEURA: Shallow inspiration. Lungs are clear. No pleural effusion or pneumothorax. HEART AND MEDIASTINUM: Heart size and  pulmonary vascularity are normal. Mediastinal contours appear intact. BONES AND SOFT TISSUES: No acute osseous abnormality. IMPRESSION: 1. No acute cardiopulmonary abnormality. Electronically signed by: Elsie Gravely MD 01/15/2024 12:09 AM EST RP Workstation: [id number]     PROCEDURES:  Critical Care performed: No      Procedures    IMPRESSION / MDM / ASSESSMENT AND PLAN / ED COURSE  I reviewed the triage vital signs and the nursing notes.    Patient here with symptoms of viral URI with recent exposure to COVID-19.    DIFFERENTIAL DIAGNOSIS (includes but not limited to):   COVID, flu, other viral URI, pneumonia, doubt sepsis   Patient's presentation is most consistent with acute complicated illness / injury requiring diagnostic workup.   PLAN: Chest x-ray reviewed and interpreted by myself and the radiologist and shows no acute abnormality.  No indication for routine COVID or flu testing today.  Offered Paxlovid  given patient's age and comorbidities and recent COVID exposure.  She is agreeable to this plan.  Discussed supportive care instructions.  No increased work of breathing, respiratory distress.  Well-appearing, nontoxic here.     MEDICATIONS GIVEN IN ED: Medications  acetaminophen  (TYLENOL ) tablet 1,000 mg (1,000 mg Oral Given 01/15/24 0235)     ED COURSE:  At this time, I do not feel there is any life-threatening condition present. I reviewed all nursing notes, vitals,  pertinent previous records.  All lab and urine results, EKGs, imaging ordered have been independently reviewed and interpreted by myself.  I reviewed all available radiology reports from any imaging ordered this visit.  Based on my assessment, I feel the patient is safe to be discharged home without further emergent workup and can continue workup as an outpatient as needed. Discussed all findings, treatment plan as well as usual and customary return precautions.  They verbalize understanding and are comfortable with this plan.  Outpatient follow-up has been provided as needed.  All questions have been answered.   CONSULTS:  none   OUTSIDE RECORDS REVIEWED: Reviewed recent internal medicine and cardiology notes.       FINAL CLINICAL IMPRESSION(S) / ED DIAGNOSES   Final diagnoses:  Viral upper respiratory tract infection     Rx / DC Orders   ED Discharge Orders          Ordered    nirmatrelvir /ritonavir  (PAXLOVID , 300/100,) 20 x 150 MG & 10 x 100MG  TBPK  2 times daily        01/15/24 0228             Note:  This document was prepared using Dragon voice recognition software and may include unintentional dictation errors.   Caidan Hubbert, Josette SAILOR, DO 01/15/24 [phone]  "

## 2024-01-23 ENCOUNTER — Other Ambulatory Visit: Payer: Self-pay

## 2024-01-24 ENCOUNTER — Other Ambulatory Visit: Payer: Self-pay

## 2024-01-24 MED ORDER — METOPROLOL TARTRATE 25 MG PO TABS
25.0000 mg | ORAL_TABLET | Freq: Every day | ORAL | 1 refills | Status: AC
  Filled 2024-01-24: qty 90, 90d supply, fill #0

## 2024-01-30 ENCOUNTER — Other Ambulatory Visit: Payer: Self-pay

## 2024-02-06 ENCOUNTER — Other Ambulatory Visit: Payer: Self-pay | Admitting: Physician Assistant

## 2024-02-06 ENCOUNTER — Ambulatory Visit
Admission: RE | Admit: 2024-02-06 | Discharge: 2024-02-06 | Disposition: A | Discharge status: Home / Self Care | Source: Ambulatory Visit | Attending: Physician Assistant | Admitting: Physician Assistant

## 2024-02-06 ENCOUNTER — Other Ambulatory Visit: Payer: Self-pay

## 2024-02-06 DIAGNOSIS — H539 Unspecified visual disturbance: Secondary | ICD-10-CM | POA: Insufficient documentation

## 2024-02-06 DIAGNOSIS — R42 Dizziness and giddiness: Secondary | ICD-10-CM

## 2024-02-06 MED ORDER — MECLIZINE HCL 25 MG PO TABS
12.5000 mg | ORAL_TABLET | Freq: Three times a day (TID) | ORAL | 0 refills | Status: AC
  Filled 2024-02-06: qty 10, 7d supply, fill #0
  Filled 2024-02-06: qty 21, 7d supply, fill #0
